# Patient Record
Sex: Female | Born: 1975 | Race: White | Hispanic: No | Marital: Married | Smoking: Never smoker
Health system: Southern US, Community
[De-identification: ages and names within clinical notes are randomized; demographics above are authoritative.]

## PROBLEM LIST (undated history)

## (undated) DIAGNOSIS — O24419 Gestational diabetes mellitus in pregnancy, unspecified control: Secondary | ICD-10-CM

## (undated) DIAGNOSIS — F329 Major depressive disorder, single episode, unspecified: Secondary | ICD-10-CM

## (undated) DIAGNOSIS — N9489 Other specified conditions associated with female genital organs and menstrual cycle: Secondary | ICD-10-CM

## (undated) DIAGNOSIS — K219 Gastro-esophageal reflux disease without esophagitis: Secondary | ICD-10-CM

## (undated) DIAGNOSIS — F32A Depression, unspecified: Secondary | ICD-10-CM

## (undated) DIAGNOSIS — G4700 Insomnia, unspecified: Secondary | ICD-10-CM

## (undated) DIAGNOSIS — R7303 Prediabetes: Secondary | ICD-10-CM

## (undated) DIAGNOSIS — F419 Anxiety disorder, unspecified: Secondary | ICD-10-CM

## (undated) DIAGNOSIS — E039 Hypothyroidism, unspecified: Secondary | ICD-10-CM

## (undated) DIAGNOSIS — E669 Obesity, unspecified: Secondary | ICD-10-CM

## (undated) DIAGNOSIS — N318 Other neuromuscular dysfunction of bladder: Secondary | ICD-10-CM

## (undated) HISTORY — DX: Gestational diabetes mellitus in pregnancy, unspecified control: O24.419

## (undated) HISTORY — DX: Other specified conditions associated with female genital organs and menstrual cycle: N94.89

## (undated) HISTORY — DX: Hypothyroidism, unspecified: E03.9

## (undated) HISTORY — DX: Anxiety disorder, unspecified: F41.9

## (undated) HISTORY — DX: Other neuromuscular dysfunction of bladder: N31.8

## (undated) HISTORY — DX: Depression, unspecified: F32.A

## (undated) HISTORY — DX: Prediabetes: R73.03

## (undated) HISTORY — DX: Insomnia, unspecified: G47.00

## (undated) HISTORY — PX: CHOLECYSTECTOMY: SHX55

## (undated) HISTORY — DX: Obesity, unspecified: E66.9

## (undated) HISTORY — DX: Gastro-esophageal reflux disease without esophagitis: K21.9

---
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
## 2002-03-20 ENCOUNTER — Encounter (INDEPENDENT_AMBULATORY_CARE_PROVIDER_SITE_OTHER): Payer: Self-pay | Admitting: Internal Medicine

## 2004-05-23 ENCOUNTER — Ambulatory Visit: Payer: Self-pay | Admitting: Family Medicine

## 2005-02-27 ENCOUNTER — Ambulatory Visit: Payer: Self-pay | Admitting: Family Medicine

## 2005-06-08 ENCOUNTER — Ambulatory Visit: Payer: Self-pay | Admitting: Family Medicine

## 2005-06-14 ENCOUNTER — Ambulatory Visit: Payer: Self-pay | Admitting: Family Medicine

## 2005-07-05 ENCOUNTER — Ambulatory Visit: Payer: Self-pay | Admitting: Family Medicine

## 2006-01-18 ENCOUNTER — Encounter (INDEPENDENT_AMBULATORY_CARE_PROVIDER_SITE_OTHER): Payer: Self-pay | Admitting: Internal Medicine

## 2006-01-18 LAB — CONVERTED CEMR LAB: Pap Smear: NORMAL

## 2006-02-05 ENCOUNTER — Ambulatory Visit: Payer: Self-pay | Admitting: Family Medicine

## 2006-02-14 ENCOUNTER — Ambulatory Visit: Payer: Self-pay | Admitting: Family Medicine

## 2006-02-19 ENCOUNTER — Other Ambulatory Visit: Admission: RE | Admit: 2006-02-19 | Discharge: 2006-02-19 | Payer: Self-pay | Admitting: Internal Medicine

## 2006-02-19 ENCOUNTER — Ambulatory Visit: Payer: Self-pay | Admitting: Internal Medicine

## 2006-04-23 ENCOUNTER — Ambulatory Visit: Payer: Self-pay | Admitting: Family Medicine

## 2006-05-18 ENCOUNTER — Ambulatory Visit: Payer: Self-pay | Admitting: Family Medicine

## 2006-05-22 ENCOUNTER — Ambulatory Visit: Payer: Self-pay | Admitting: Obstetrics & Gynecology

## 2006-05-23 ENCOUNTER — Ambulatory Visit (HOSPITAL_COMMUNITY): Admission: RE | Admit: 2006-05-23 | Discharge: 2006-05-23 | Payer: Self-pay | Admitting: Gynecology

## 2006-06-06 ENCOUNTER — Ambulatory Visit: Payer: Self-pay | Admitting: Obstetrics & Gynecology

## 2006-06-08 ENCOUNTER — Encounter: Payer: Self-pay | Admitting: Internal Medicine

## 2006-06-12 ENCOUNTER — Ambulatory Visit: Payer: Self-pay | Admitting: Family Medicine

## 2006-06-13 ENCOUNTER — Ambulatory Visit (HOSPITAL_COMMUNITY): Admission: RE | Admit: 2006-06-13 | Discharge: 2006-06-13 | Payer: Self-pay | Admitting: Obstetrics & Gynecology

## 2006-06-27 ENCOUNTER — Ambulatory Visit: Payer: Self-pay | Admitting: Obstetrics & Gynecology

## 2006-07-23 ENCOUNTER — Ambulatory Visit: Payer: Self-pay | Admitting: Gynecology

## 2006-07-25 ENCOUNTER — Ambulatory Visit: Payer: Self-pay | Admitting: Gynecology

## 2006-08-14 ENCOUNTER — Ambulatory Visit: Payer: Self-pay | Admitting: Gynecology

## 2006-09-03 ENCOUNTER — Ambulatory Visit: Payer: Self-pay | Admitting: Gynecology

## 2006-09-11 ENCOUNTER — Ambulatory Visit: Payer: Self-pay | Admitting: Family Medicine

## 2006-09-19 ENCOUNTER — Ambulatory Visit: Payer: Self-pay | Admitting: Obstetrics & Gynecology

## 2006-10-03 ENCOUNTER — Ambulatory Visit: Payer: Self-pay | Admitting: Obstetrics & Gynecology

## 2006-10-17 ENCOUNTER — Ambulatory Visit: Payer: Self-pay | Admitting: Obstetrics & Gynecology

## 2006-10-24 ENCOUNTER — Ambulatory Visit: Payer: Self-pay | Admitting: Obstetrics & Gynecology

## 2006-10-31 ENCOUNTER — Ambulatory Visit: Payer: Self-pay | Admitting: Obstetrics & Gynecology

## 2006-11-07 ENCOUNTER — Ambulatory Visit: Payer: Self-pay | Admitting: Obstetrics & Gynecology

## 2006-11-08 ENCOUNTER — Ambulatory Visit: Payer: Self-pay | Admitting: Obstetrics & Gynecology

## 2006-11-09 ENCOUNTER — Inpatient Hospital Stay (HOSPITAL_COMMUNITY): Admission: AD | Admit: 2006-11-09 | Discharge: 2006-11-09 | Payer: Self-pay | Admitting: Obstetrics and Gynecology

## 2006-11-09 ENCOUNTER — Ambulatory Visit: Payer: Self-pay | Admitting: *Deleted

## 2006-11-12 ENCOUNTER — Ambulatory Visit: Payer: Self-pay | Admitting: Gynecology

## 2006-11-16 ENCOUNTER — Ambulatory Visit: Payer: Self-pay | Admitting: Obstetrics & Gynecology

## 2006-11-16 ENCOUNTER — Inpatient Hospital Stay (HOSPITAL_COMMUNITY): Admission: AD | Admit: 2006-11-16 | Discharge: 2006-11-20 | Payer: Self-pay | Admitting: Gynecology

## 2006-12-04 ENCOUNTER — Telehealth: Payer: Self-pay | Admitting: Family Medicine

## 2006-12-08 ENCOUNTER — Inpatient Hospital Stay: Payer: Self-pay | Admitting: Surgery

## 2006-12-08 ENCOUNTER — Other Ambulatory Visit: Payer: Self-pay

## 2007-01-02 ENCOUNTER — Ambulatory Visit: Payer: Self-pay | Admitting: Obstetrics & Gynecology

## 2007-02-27 ENCOUNTER — Ambulatory Visit: Payer: Self-pay | Admitting: Obstetrics & Gynecology

## 2007-02-27 ENCOUNTER — Ambulatory Visit: Payer: Self-pay | Admitting: Family Medicine

## 2007-04-02 ENCOUNTER — Encounter: Payer: Self-pay | Admitting: Obstetrics & Gynecology

## 2007-04-02 ENCOUNTER — Ambulatory Visit: Payer: Self-pay | Admitting: Family Medicine

## 2007-06-05 ENCOUNTER — Ambulatory Visit: Payer: Self-pay | Admitting: Family Medicine

## 2007-06-05 DIAGNOSIS — F331 Major depressive disorder, recurrent, moderate: Secondary | ICD-10-CM | POA: Insufficient documentation

## 2007-06-05 DIAGNOSIS — F329 Major depressive disorder, single episode, unspecified: Secondary | ICD-10-CM

## 2007-06-25 ENCOUNTER — Ambulatory Visit: Payer: Self-pay | Admitting: Family Medicine

## 2007-07-25 ENCOUNTER — Encounter (INDEPENDENT_AMBULATORY_CARE_PROVIDER_SITE_OTHER): Payer: Self-pay | Admitting: Internal Medicine

## 2007-08-08 ENCOUNTER — Ambulatory Visit: Payer: Self-pay | Admitting: Family Medicine

## 2007-09-26 ENCOUNTER — Encounter (INDEPENDENT_AMBULATORY_CARE_PROVIDER_SITE_OTHER): Payer: Self-pay | Admitting: Internal Medicine

## 2007-10-16 ENCOUNTER — Ambulatory Visit: Payer: Self-pay | Admitting: Family Medicine

## 2007-10-16 DIAGNOSIS — K219 Gastro-esophageal reflux disease without esophagitis: Secondary | ICD-10-CM | POA: Insufficient documentation

## 2007-10-16 DIAGNOSIS — R1310 Dysphagia, unspecified: Secondary | ICD-10-CM | POA: Insufficient documentation

## 2007-12-18 ENCOUNTER — Ambulatory Visit: Payer: Self-pay | Admitting: Family Medicine

## 2008-03-09 ENCOUNTER — Ambulatory Visit: Payer: Self-pay | Admitting: Family Medicine

## 2008-03-09 LAB — CONVERTED CEMR LAB
Blood in Urine, dipstick: NEGATIVE
Protein, U semiquant: NEGATIVE

## 2008-03-10 ENCOUNTER — Encounter: Payer: Self-pay | Admitting: Family Medicine

## 2008-03-30 ENCOUNTER — Encounter (INDEPENDENT_AMBULATORY_CARE_PROVIDER_SITE_OTHER): Payer: Self-pay | Admitting: Internal Medicine

## 2008-04-14 ENCOUNTER — Telehealth: Payer: Self-pay | Admitting: Family Medicine

## 2008-07-02 ENCOUNTER — Encounter (INDEPENDENT_AMBULATORY_CARE_PROVIDER_SITE_OTHER): Payer: Self-pay | Admitting: Internal Medicine

## 2008-08-11 ENCOUNTER — Ambulatory Visit: Payer: Self-pay | Admitting: Family Medicine

## 2008-08-11 LAB — CONVERTED CEMR LAB: Rapid Strep: NEGATIVE

## 2008-10-14 ENCOUNTER — Telehealth (INDEPENDENT_AMBULATORY_CARE_PROVIDER_SITE_OTHER): Payer: Self-pay | Admitting: Internal Medicine

## 2008-10-16 ENCOUNTER — Telehealth (INDEPENDENT_AMBULATORY_CARE_PROVIDER_SITE_OTHER): Payer: Self-pay | Admitting: Internal Medicine

## 2008-10-18 HISTORY — PX: ECTOPIC PREGNANCY SURGERY: SHX613

## 2008-10-19 ENCOUNTER — Encounter (INDEPENDENT_AMBULATORY_CARE_PROVIDER_SITE_OTHER): Payer: Self-pay | Admitting: Internal Medicine

## 2008-10-21 ENCOUNTER — Ambulatory Visit: Payer: Self-pay | Admitting: Obstetrics and Gynecology

## 2008-10-24 ENCOUNTER — Encounter: Payer: Self-pay | Admitting: Obstetrics and Gynecology

## 2008-10-24 ENCOUNTER — Ambulatory Visit (HOSPITAL_COMMUNITY): Admission: AD | Admit: 2008-10-24 | Discharge: 2008-10-24 | Payer: Self-pay | Admitting: Obstetrics and Gynecology

## 2008-10-27 ENCOUNTER — Telehealth (INDEPENDENT_AMBULATORY_CARE_PROVIDER_SITE_OTHER): Payer: Self-pay | Admitting: Internal Medicine

## 2008-10-28 ENCOUNTER — Encounter (INDEPENDENT_AMBULATORY_CARE_PROVIDER_SITE_OTHER): Payer: Self-pay | Admitting: Internal Medicine

## 2008-10-29 ENCOUNTER — Ambulatory Visit: Payer: Self-pay | Admitting: Obstetrics and Gynecology

## 2008-10-29 ENCOUNTER — Encounter: Payer: Self-pay | Admitting: Family Medicine

## 2008-10-29 LAB — CONVERTED CEMR LAB: hCG, Beta Chain, Quant, S: 38.2 milliintl units/mL

## 2008-10-30 ENCOUNTER — Encounter: Payer: Self-pay | Admitting: Family Medicine

## 2008-11-10 ENCOUNTER — Ambulatory Visit: Payer: Self-pay | Admitting: Family Medicine

## 2008-11-24 ENCOUNTER — Encounter: Payer: Self-pay | Admitting: Obstetrics & Gynecology

## 2008-11-24 LAB — CONVERTED CEMR LAB: hCG, Beta Chain, Quant, S: 2.8 milliintl units/mL

## 2009-02-24 ENCOUNTER — Telehealth (INDEPENDENT_AMBULATORY_CARE_PROVIDER_SITE_OTHER): Payer: Self-pay | Admitting: *Deleted

## 2009-02-24 ENCOUNTER — Encounter (INDEPENDENT_AMBULATORY_CARE_PROVIDER_SITE_OTHER): Payer: Self-pay | Admitting: Internal Medicine

## 2009-03-04 ENCOUNTER — Ambulatory Visit: Payer: Self-pay | Admitting: Obstetrics & Gynecology

## 2009-03-08 ENCOUNTER — Ambulatory Visit: Payer: Self-pay | Admitting: Family Medicine

## 2009-03-17 ENCOUNTER — Ambulatory Visit: Payer: Self-pay | Admitting: Obstetrics & Gynecology

## 2009-03-23 ENCOUNTER — Ambulatory Visit (HOSPITAL_COMMUNITY): Admission: RE | Admit: 2009-03-23 | Discharge: 2009-03-23 | Payer: Self-pay | Admitting: Obstetrics & Gynecology

## 2009-03-25 ENCOUNTER — Ambulatory Visit: Payer: Self-pay | Admitting: Obstetrics and Gynecology

## 2009-04-26 ENCOUNTER — Ambulatory Visit: Payer: Self-pay | Admitting: Obstetrics and Gynecology

## 2009-04-27 ENCOUNTER — Telehealth: Payer: Self-pay | Admitting: Family Medicine

## 2009-05-04 ENCOUNTER — Ambulatory Visit (HOSPITAL_COMMUNITY): Admission: RE | Admit: 2009-05-04 | Discharge: 2009-05-04 | Payer: Self-pay | Admitting: Obstetrics and Gynecology

## 2009-05-09 ENCOUNTER — Emergency Department: Payer: Self-pay | Admitting: Emergency Medicine

## 2009-05-21 ENCOUNTER — Ambulatory Visit: Payer: Self-pay | Admitting: Family Medicine

## 2009-05-24 ENCOUNTER — Ambulatory Visit: Payer: Self-pay | Admitting: Obstetrics & Gynecology

## 2009-05-31 ENCOUNTER — Ambulatory Visit (HOSPITAL_COMMUNITY): Admission: RE | Admit: 2009-05-31 | Discharge: 2009-05-31 | Payer: Self-pay | Admitting: Obstetrics and Gynecology

## 2009-06-21 ENCOUNTER — Ambulatory Visit: Payer: Self-pay | Admitting: Obstetrics and Gynecology

## 2009-06-23 ENCOUNTER — Ambulatory Visit (HOSPITAL_COMMUNITY): Admission: RE | Admit: 2009-06-23 | Discharge: 2009-06-23 | Payer: Self-pay | Admitting: Obstetrics & Gynecology

## 2009-06-30 ENCOUNTER — Encounter: Payer: Self-pay | Admitting: Family Medicine

## 2009-07-19 ENCOUNTER — Ambulatory Visit: Payer: Self-pay | Admitting: Obstetrics and Gynecology

## 2009-08-09 ENCOUNTER — Ambulatory Visit: Payer: Self-pay | Admitting: Obstetrics and Gynecology

## 2009-08-19 ENCOUNTER — Ambulatory Visit (HOSPITAL_COMMUNITY): Admission: RE | Admit: 2009-08-19 | Discharge: 2009-08-19 | Payer: Self-pay | Admitting: Family Medicine

## 2009-08-19 ENCOUNTER — Ambulatory Visit: Payer: Self-pay | Admitting: Obstetrics and Gynecology

## 2009-08-20 ENCOUNTER — Encounter: Payer: Self-pay | Admitting: Obstetrics and Gynecology

## 2009-09-02 ENCOUNTER — Ambulatory Visit: Payer: Self-pay | Admitting: Obstetrics & Gynecology

## 2009-09-03 ENCOUNTER — Encounter: Payer: Self-pay | Admitting: Obstetrics and Gynecology

## 2009-09-16 ENCOUNTER — Ambulatory Visit: Payer: Self-pay | Admitting: Obstetrics & Gynecology

## 2009-09-27 ENCOUNTER — Ambulatory Visit: Payer: Self-pay | Admitting: Obstetrics and Gynecology

## 2009-10-12 ENCOUNTER — Ambulatory Visit: Payer: Self-pay | Admitting: Obstetrics & Gynecology

## 2009-10-20 ENCOUNTER — Ambulatory Visit: Payer: Self-pay | Admitting: Family Medicine

## 2009-10-21 ENCOUNTER — Encounter: Payer: Self-pay | Admitting: Family Medicine

## 2009-10-21 LAB — CONVERTED CEMR LAB: GC Probe Amp, Genital: NEGATIVE

## 2009-10-26 ENCOUNTER — Ambulatory Visit: Payer: Self-pay | Admitting: Family Medicine

## 2009-11-01 ENCOUNTER — Ambulatory Visit: Payer: Self-pay | Admitting: Obstetrics and Gynecology

## 2009-11-01 LAB — CONVERTED CEMR LAB

## 2009-11-09 ENCOUNTER — Ambulatory Visit: Payer: Self-pay | Admitting: Obstetrics & Gynecology

## 2009-11-14 ENCOUNTER — Ambulatory Visit: Payer: Self-pay | Admitting: Obstetrics & Gynecology

## 2009-11-14 ENCOUNTER — Inpatient Hospital Stay (HOSPITAL_COMMUNITY): Admission: AD | Admit: 2009-11-14 | Discharge: 2009-11-17 | Payer: Self-pay | Admitting: Obstetrics and Gynecology

## 2009-11-16 ENCOUNTER — Encounter: Payer: Self-pay | Admitting: Obstetrics and Gynecology

## 2009-12-23 ENCOUNTER — Ambulatory Visit: Payer: Self-pay | Admitting: Obstetrics & Gynecology

## 2009-12-23 ENCOUNTER — Encounter: Payer: Self-pay | Admitting: Obstetrics and Gynecology

## 2009-12-23 LAB — CONVERTED CEMR LAB: Yeast Wet Prep HPF POC: NONE SEEN

## 2009-12-28 ENCOUNTER — Ambulatory Visit: Payer: Self-pay | Admitting: Obstetrics & Gynecology

## 2010-01-13 ENCOUNTER — Ambulatory Visit: Payer: Self-pay | Admitting: Family Medicine

## 2010-01-19 ENCOUNTER — Encounter: Payer: Self-pay | Admitting: Internal Medicine

## 2010-02-14 ENCOUNTER — Ambulatory Visit: Payer: Self-pay | Admitting: Family Medicine

## 2010-04-05 ENCOUNTER — Ambulatory Visit
Admission: RE | Admit: 2010-04-05 | Discharge: 2010-04-05 | Payer: Self-pay | Source: Home / Self Care | Attending: Family Medicine | Admitting: Family Medicine

## 2010-04-05 ENCOUNTER — Encounter: Payer: Self-pay | Admitting: Family Medicine

## 2010-04-05 LAB — CONVERTED CEMR LAB
ALT: 21 units/L
ALT: 21 units/L (ref 0–35)
AST: 15 units/L
AST: 15 units/L (ref 0–37)
Alkaline Phosphatase: 72 units/L
Alkaline Phosphatase: 72 units/L (ref 39–117)
BUN: 13 mg/dL
BUN: 13 mg/dL (ref 6–23)
Calcium: 8.7 mg/dL
Calcium: 8.7 mg/dL (ref 8.4–10.5)
Chloride: 102 meq/L
Chloride: 102 meq/L (ref 96–112)
Creatinine, Ser: 0.73 mg/dL (ref 0.40–1.20)
Glucose, Bld: 91 mg/dL (ref 70–99)
HCT: 37 % (ref 36.0–46.0)
MCHC: 31.1 g/dL (ref 30.0–36.0)
MCV: 74.4 fL
MCV: 74.4 fL — ABNORMAL LOW (ref 78.0–100.0)
Potassium: 3.8 meq/L
RDW: 15.6 %
Sodium: 135 meq/L
TSH: 8.987 microintl units/mL
Total Bilirubin: 0.3 mg/dL (ref 0.3–1.2)
Total Protein: 7.5 g/dL
WBC: 12.8 10*3/uL — ABNORMAL HIGH (ref 4.0–10.5)

## 2010-04-07 ENCOUNTER — Encounter: Payer: Self-pay | Admitting: Family Medicine

## 2010-04-07 LAB — CONVERTED CEMR LAB
Free T4: 0.69 ng/dL
T3, Free: 2.7 pg/mL

## 2010-04-14 ENCOUNTER — Ambulatory Visit
Admission: RE | Admit: 2010-04-14 | Discharge: 2010-04-14 | Payer: Self-pay | Source: Home / Self Care | Attending: Family Medicine | Admitting: Family Medicine

## 2010-04-14 DIAGNOSIS — E039 Hypothyroidism, unspecified: Secondary | ICD-10-CM | POA: Insufficient documentation

## 2010-04-14 DIAGNOSIS — N39498 Other specified urinary incontinence: Secondary | ICD-10-CM | POA: Insufficient documentation

## 2010-04-15 ENCOUNTER — Encounter: Payer: Self-pay | Admitting: Family Medicine

## 2010-04-19 NOTE — Assessment & Plan Note (Signed)
Summary: flu shot/alc  Nurse Visit   Allergies: 1)  ! Pcn 2)  ! Erythromycin  Orders Added: 1)  Admin 1st Vaccine [90471] 2)  Flu Vaccine 35yrs + [[address]]  Flu Vaccine Consent Questions     Do you have a history of severe allergic reactions to this vaccine? no    Any prior history of allergic reactions to egg and/or gelatin? no    Do you have a sensitivity to the preservative Thimersol? no    Do you have a past history of Guillan-Barre Syndrome? no    Do you currently have an acute febrile illness? no    Have you ever had a severe reaction to latex? no    Vaccine information given and explained to patient? yes    Are you currently pregnant? no    Lot Number:[id number]   Exp Date:09/17/2010   Site Given  Left Deltoid IM

## 2010-04-19 NOTE — Letter (Signed)
Summary: [facility]   Imported By: Maryln Gottron 07/12/2009 15:55:50  _____________________________________________________________________  External Attachment:    Type:   Image     Comment:   External Document

## 2010-04-19 NOTE — Letter (Signed)
Summary: Out of Work  [employer] at [facility]  [address] [place], [place] [address]   Phone: [phone]  Fax: [phone]    February 14, 2010   Employee:  [employer]AL[employer]    To Whom It May Concern:   For Medical reasons, please excuse the above named employee from work for the following dates:  Start:   02/14/2010  End:   02/15/2010  If you need additional information, please feel free to contact our office.         Sincerely,    Ruthe Mannan MD

## 2010-04-19 NOTE — Letter (Signed)
Summary: [facility]   Imported By: Lanelle Bal 01/27/2010 08:05:54  _____________________________________________________________________  External Attachment:    Type:   Image     Comment:   External Document  Appended Document: [facility] restarting imipramine for stress incontinence considering bulking agents or even surgery (if not planning any other children)

## 2010-04-19 NOTE — Assessment & Plan Note (Signed)
Summary: UPPER RESPIRTORY COUGH, FEVER, CHILLS, ACHES / LFW   Vital Signs:  Patient profile:   35 year old female Height:      61.5 inches Weight:      224 pounds BMI:     41.79 Temp:     98.9 degrees F oral Pulse rate:   84 / minute Pulse rhythm:   regular BP sitting:   112 / 76  (right arm) Cuff size:   large  Vitals Entered By: Linde Gillis CMA Duncan Dull) (February 14, 2010 9:11 AM) CC: cough, congestion, fever   History of Present Illness: 35 yo here for URI symptoms.  4 days ago, started with runny nose, malaise. Progressed over next several days.  Tmas 101.2 Cough is dry. No wheezing or SOB.  Taking Mucinex with some relief of symptoms but having coughing spells at night.  Current Medications (verified): 1)  Tylenol Cold No Drowsiness 30-325-15 Mg Tabs (Pseudoephedrine-Apap-Dm) .... As Needed 2)  Omeprazole 20 Mg Cpdr (Omeprazole) .... One Tablet 45 Minutes Before Breakfast and One Tablet 45 Minutes Before Supper 3)  Delsym 30 Mg/30ml Lqcr (Dextromethorphan Polistirex) .... As Needed 4)  Imipramine Hcl 10 Mg Tabs (Imipramine Hcl) .... Take 2 By Mouth At Bedtime 5)  Tussionex Pennkinetic Er 8-10 Mg/38ml Lqcr (Chlorpheniramine-Hydrocodone) .... 5 Ml By Mouth At Bedtime As Needed Cough.  Allergies: 1)  ! Pcn 2)  ! Erythromycin  Past History:  Past Surgical History: Last updated: 02/27/2007 breast reconstruction  1996 chloecysltectomy  9/08  Social History: Last updated: 06/05/2007 Marital Status: Married Children: 1 son--3/09 Occupation: teaches school  Risk Factors: Alcohol Use: <1 (06/05/2007) Exercise: no (06/05/2007)  Risk Factors: Smoking Status: never (06/08/2006) Passive Smoke Exposure: no (06/05/2007)  Review of Systems      See HPI General:  Complains of fever and malaise. ENT:  Complains of earache, nasal congestion, postnasal drainage, sinus pressure, and sore throat. CV:  Denies chest pain or discomfort. Resp:  Complains of cough; denies  shortness of breath, sputum productive, and wheezing.  Physical Exam  General:  alert, well-developed, well-nourished, and well-hydrated, congested. VSS, non toxic appearing. Nose:  boggy turbinates, sinuses NTTP Mouth:  pharyngeal erythema.   Neck:  No deformities, masses, or tenderness noted. Lungs:  normal respiratory effort, no intercostal retractions, no accessory muscle use, and normal breath sounds.   Heart:  Normal rate and regular rhythm. S1 and S2 normal without gallop, murmur, click, rub or other extra sounds. Psych:  Cognition and judgment appear intact. Alert and cooperative with normal attention span and concentration. No apparent delusions, illusions, hallucinations   Impression & Recommendations:  Problem # 1:  URI (ICD-465.9) Assessment New Likely viral. Supportive care as per pt instructions. Tussionex as needed cough. The following medications were removed from the medication list:    Delsym 30 Mg/27ml Lqcr (Dextromethorphan polistirex) .Marland Kitchen... As needed Her updated medication list for this problem includes:    Tylenol Cold No Drowsiness 30-325-15 Mg Tabs (Pseudoephedrine-apap-dm) .Marland Kitchen... As needed    Tussionex Pennkinetic Er 8-10 Mg/51ml Lqcr (Chlorpheniramine-hydrocodone) .Marland KitchenMarland KitchenMarland KitchenMarland Kitchen 5 ml by mouth at bedtime as needed cough.  Complete Medication List: 1)  Tylenol Cold No Drowsiness 30-325-15 Mg Tabs (Pseudoephedrine-apap-dm) .... As needed 2)  Omeprazole 20 Mg Cpdr (Omeprazole) .... One tablet 45 minutes before breakfast and one tablet 45 minutes before supper 3)  Imipramine Hcl 10 Mg Tabs (Imipramine hcl) .... Take 2 by mouth at bedtime 4)  Tussionex Pennkinetic Er 8-10 Mg/89ml Lqcr (Chlorpheniramine-hydrocodone) .... 5 ml by mouth  at bedtime as needed cough.  Patient Instructions: 1)   Drink lots of fluids.  Treat sympotmatically with Mucinex, nasal saline irrigation, and Tylenol/Ibuprofen. Also try claritin D or zyrtec D over the counter- two times a day as needed ( have  to sign for them at pharmacy). You can use warm compresses.  Cough suppressant at night. Call if not improving as expected in 5-7 days.  Prescriptions: TUSSIONEX PENNKINETIC ER 8-10 MG/5ML LQCR (CHLORPHENIRAMINE-HYDROCODONE) 5 ml by mouth at bedtime as needed cough.  #5 ounces x 0   Entered and Authorized by:   Ruthe Mannan MD   Signed by:   Ruthe Mannan MD on 02/14/2010   Method used:   Print then Give to Patient   RxID:   [id number]    Orders Added: 1)  Est. Patient Level III [[address]]    Current Allergies (reviewed today): ! PCN ! ERYTHROMYCIN

## 2010-04-19 NOTE — Progress Notes (Signed)
Summary: concerned about flu  Phone Note Call from Patient Call back at [phone]   Caller: Patient Summary of Call: Pt has been exposed to the flu through her babysitter.  She didnt get a flu shot.  She is pregnant and is concerned about getting sick.  Please advise.  [facility]. Initial call taken by: Lowella Petties CMA,  April 27, 2009 4:22 PM  Follow-up for Phone Call        Resonably healthy people practicing good hygiene don't automatically get the flu from being "exposed". If develops fever, chills, congestion and extreme achiness, let us know immediately and will treat. As long as given medication within 48 hrs of sxs starting, should be effective. Follow-up by: Shaune Leeks MD,  April 27, 2009 5:01 PM  Additional Follow-up for Phone Call Additional follow up Details #1::        Advised pt.  She agreed. Additional Follow-up by: Lowella Petties CMA,  April 27, 2009 5:10 PM

## 2010-04-21 NOTE — Assessment & Plan Note (Signed)
Summary: TRANSFER FROM BILLIE/HYPOTHYROID/   Vital Signs:  Patient profile:   35 year old female Weight:      234 pounds Temp:     98.3 degrees F oral Pulse rate:   82 / minute Pulse rhythm:   regular BP sitting:   116 / 80  (left arm) Cuff size:   large  Vitals Entered By: Selena Batten Dance CMA Duncan Dull) (April 14, 2010 3:34 PM) CC: Thyroid issues Comments Headaches,Hot flashes, Weight gain,fatigue   History of Present Illness: CC: ? hypothyroidism, establish  Saw GYN for CPE last week.  Found to have IDA, as well low thyroid.  Started on iron, advised to f/u with PCP.  Sxs have been going on for 2 months, s/p new baby (33mo old at home).  + NS, hot flashes throughout day, no appetite, gaining weight.  + HA.  + fatigue  NO SOB, dizziness, palpitations.    Current Medications (verified): 1)  Omeprazole 20 Mg Cpdr (Omeprazole) .... One Tablet 45 Minutes Before Breakfast and One Tablet 45 Minutes Before Supper 2)  Imipramine Hcl 10 Mg Tabs (Imipramine Hcl) .... Take 2 By Mouth At Bedtime 3)  Iron 325 (65 Fe) Mg Tabs (Ferrous Sulfate) .Marland Kitchen.. 1 By Mouth Once Daily 4)  Cranberry Concentrate 500 Mg Caps (Cranberry) .Marland Kitchen.. 1 By Mouth Once Daily 5)  Prenatal/folic Acid  Tabs (Prenatal Vit-Fe Fumarate-Fa) .Marland Kitchen.. 1 By Mouth Once Daily 6)  Mucinex 600 Mg Xr12h-Tab (Guaifenesin) .Marland Kitchen.. 1 By Mouth Two Times A Day As Needed  Allergies: 1)  ! Erythromycin 2)  Pcn  Past History:  Past Medical History: ?GDM hypothyroid low bladder [uro]  Uro - Cope  Past Surgical History: breast reconstruction  1996 chloecyltectomy  9/08 ectopic L fallopian tube removed (tube remained) 10/2008  Family History: Parents: healthy Father: porcine valve replacement, HTN PGF: hemochromatosis MGM: ovarian cancer cousin: thyroid cancer  No DM, other CA, CAD/MI, CVA  Social History: no smoking, social EtOH, no rec drugs caffeine: 1 cup caffeine/day Marital Status: Married Lives with husband and 2 sons--2008,  2011 Occupation: teaches school, 2nd grade  Review of Systems       per HPI  Physical Exam  General:  alert, well-developed, well-nourished, and well-hydrated Head:  Normocephalic and atraumatic without obvious abnormalities. No apparent alopecia or balding. Eyes:  Conjunctiva clear bilaterally.  Ears:  TMs clear Nose:  nares clear Mouth:  MMMM, no erythema/exudate Neck:  No deformities, masses, or tenderness noted.  no thyromegaly, no LAD Lungs:  normal respiratory effort, no intercostal retractions, no accessory muscle use, and normal breath sounds.   Heart:  Normal rate and regular rhythm. S1 and S2 normal without gallop, murmur, click, rub or other extra sounds. Pulses:  2+ rad pulses Extremities:  no pedal edema   Impression & Recommendations:  Problem # 1:  HYPOTHYROIDISM (ICD-244.9) ? due to postpartum thyroiditis.  will start synthroid at daily, recheck in 6 wks.  slowly titrate up.  consider 6-12 mo treatment and then back down to see if longterm treatment will be needed.  Her updated medication list for this problem includes:    Synthroid 75 Mcg Tabs (Levothyroxine sodium) ..... One daily for thyroid (use this dose)  Labs Reviewed: TSH: 8.987 (04/05/2010)     Complete Medication List: 1)  Omeprazole 20 Mg Cpdr (Omeprazole) .... One tablet 45 minutes before breakfast and one tablet 45 minutes before supper 2)  Imipramine Hcl 10 Mg Tabs (Imipramine hcl) .... Take 2 by mouth at bedtime  3)  Iron 325 (65 Fe) Mg Tabs (Ferrous sulfate) .Marland Kitchen.. 1 by mouth once daily 4)  Cranberry Concentrate 500 Mg Caps (Cranberry) .Marland Kitchen.. 1 by mouth once daily 5)  Prenatal/folic Acid Tabs (Prenatal vit-fe fumarate-fa) .Marland Kitchen.. 1 by mouth once daily 6)  Mucinex 600 Mg Xr12h-tab (Guaifenesin) .Marland Kitchen.. 1 by mouth two times a day as needed 7)  Synthroid 75 Mcg Tabs (Levothyroxine sodium) .... One daily for thyroid (use this dose)  Patient Instructions: 1)  Start thyroid medicine for hypothyroid. 2)   Good to see you today. 3)  return in 6 weeks for repeat blood work and visit afterwards [TSH, free T4 244.9] 4)  Good to meet you today, call clinic with quesitons. Prescriptions: SYNTHROID 75 MCG TABS (LEVOTHYROXINE SODIUM) one daily for thyroid (use this dose)  #30 x 1   Entered and Authorized by:   Eustaquio Boyden  MD   Signed by:   Eustaquio Boyden  MD on 04/14/2010   Method used:   [facility]        [facility] (retail)       [address]       [place], [place]  [address]       Ph: [phone] or [phone]       Fax: [phone]   RxID:   [id number] SYNTHROID 100 MCG TABS (LEVOTHYROXINE SODIUM) take one daily for thyroid  #30 x 1   Entered and Authorized by:   Eustaquio Boyden  MD   Signed by:   Eustaquio Boyden  MD on 04/14/2010   Method used:   [facility]        [id number]       [address]       [place], [place]  [address]       Ph: [phone] or [phone]       Fax: [phone]   RxID:   [id number]    Orders Added: 1)  Est. Patient Level III [[address]]    Current Allergies (reviewed today): ! ERYTHROMYCIN PCN

## 2010-04-21 NOTE — Miscellaneous (Signed)
Summary: Previous labs  Clinical Lists Changes  Observations: Added new observation of T3 FREE: 2.7 pg/mL (04/07/2010 9:26) Added new observation of T4, FREE: 0.69 ng/dL (04/54/0981 1:91) Added new observation of FSH: 1.9 milliintl units/mL (04/05/2010 9:26) Added new observation of PLATELETK/UL: 268 K/uL (04/05/2010 9:26) Added new observation of RDW: 15.6 % (04/05/2010 9:26) Added new observation of MCV: 74.4 fL (04/05/2010 9:26) Added new observation of HCT: 37.0 % (04/05/2010 9:26) Added new observation of HGB: 11.5 g/dL (47/82/9562 1:30) Added new observation of WBC COUNT: 12.8 10*3/microliter (04/05/2010 9:26) Added new observation of TSH: 8.987 microintl units/mL (04/05/2010 9:26) Added new observation of CALCIUM: 8.7 mg/dL (86/57/8469 6:29) Added new observation of ALBUMIN: 4.5 g/dL (52/84/1324 4:01) Added new observation of PROTEIN, TOT: 7.5 g/dL (02/72/5366 4:40) Added new observation of SGPT (ALT): 21 units/L (04/05/2010 9:26) Added new observation of SGOT (AST): 15 units/L (04/05/2010 9:26) Added new observation of ALK PHOS: 72 units/L (04/05/2010 9:26) Added new observation of BILI TOTAL: 0.3 mg/dL (34/74/2595 6:38) Added new observation of CREATININE: 0.73 mg/dL (75/64/3329 5:18) Added new observation of BUN: 13 mg/dL (84/16/6063 0:16) Added new observation of BG RANDOM: 91 mg/dL (03/28/3233 5:73) Added new observation of CO2 PLSM/SER: 24 meq/L (04/05/2010 9:26) Added new observation of CL SERUM: 102 meq/L (04/05/2010 9:26) Added new observation of K SERUM: 3.8 meq/L (04/05/2010 9:26) Added new observation of NA: 135 meq/L (04/05/2010 9:26)

## 2010-04-28 ENCOUNTER — Telehealth: Payer: Self-pay | Admitting: Family Medicine

## 2010-04-29 ENCOUNTER — Encounter (INDEPENDENT_AMBULATORY_CARE_PROVIDER_SITE_OTHER): Payer: Self-pay | Admitting: *Deleted

## 2010-04-29 ENCOUNTER — Other Ambulatory Visit (INDEPENDENT_AMBULATORY_CARE_PROVIDER_SITE_OTHER): Payer: BC Managed Care – PPO

## 2010-04-29 DIAGNOSIS — E039 Hypothyroidism, unspecified: Secondary | ICD-10-CM

## 2010-05-03 ENCOUNTER — Encounter: Payer: Self-pay | Admitting: Family Medicine

## 2010-05-03 LAB — CONVERTED CEMR LAB
Free T4: 0.92 ng/dL
T3, Free: 2.8 pg/mL
TSH: 8.1 microintl units/mL

## 2010-05-04 ENCOUNTER — Encounter: Payer: Self-pay | Admitting: Family Medicine

## 2010-05-05 NOTE — Progress Notes (Signed)
Summary: still has sxs  Phone Note Call from Patient Call back at [phone]   Caller: Patient Call For: Eustaquio Boyden  MD Summary of Call: Pt has been on synthroid for 2 weeks and is still having hot flashes, headaches, still extremely tired even after a nights sleep, irritable. These are the same sxs she had prior to starting the medicine, but now they are worse.   She is asking if the medicine has not kicked in yet, or if she could be having a reaction to the medicine. Initial call taken by: Lowella Petties CMA, AAMA,  April 28, 2010 4:08 PM  Follow-up for Phone Call        if feeling worse, would recommend return for blood work sooner [TSH, free T3, free T4, anti TPO, and thyroid stimulating immunoglobulins 244.9].   Follow-up by: Eustaquio Boyden  MD,  April 28, 2010 9:17 PM  Additional Follow-up for Phone Call Additional follow up Details #1::        Notified patient and lab appt scheduled for this afternoon.  Additional Follow-up by: Janee Morn CMA Duncan Dull),  April 29, 2010 8:18 AM

## 2010-05-19 ENCOUNTER — Telehealth: Payer: Self-pay | Admitting: Family Medicine

## 2010-05-19 ENCOUNTER — Encounter: Payer: Self-pay | Admitting: Family Medicine

## 2010-05-19 ENCOUNTER — Encounter (INDEPENDENT_AMBULATORY_CARE_PROVIDER_SITE_OTHER): Payer: Self-pay | Admitting: *Deleted

## 2010-05-19 ENCOUNTER — Other Ambulatory Visit (INDEPENDENT_AMBULATORY_CARE_PROVIDER_SITE_OTHER): Payer: BC Managed Care – PPO

## 2010-05-19 DIAGNOSIS — R112 Nausea with vomiting, unspecified: Secondary | ICD-10-CM

## 2010-05-19 DIAGNOSIS — N912 Amenorrhea, unspecified: Secondary | ICD-10-CM

## 2010-05-19 DIAGNOSIS — E039 Hypothyroidism, unspecified: Secondary | ICD-10-CM

## 2010-05-19 LAB — CONVERTED CEMR LAB
AST: 13 units/L
Albumin: 4 g/dL
BUN: 12 mg/dL
Calcium: 8.5 mg/dL
Chloride: 100 meq/L
Glucose, Bld: 95 mg/dL
Hemoglobin: 12.1 g/dL
MCV: 73 fL
Platelets: 265 10*3/uL
Potassium: 3.7 meq/L
TSH: 1.84 microintl units/mL

## 2010-05-26 NOTE — Progress Notes (Signed)
Summary: pt wants labs  Phone Note Call from Patient Call back at [phone]   Caller: Patient Call For: Eustaquio Boyden  MD Summary of Call: Pt states she has been getting sick every morning- vomiting.  Didnt have a period in february but has taken 2 pregnancy tests which were negative.  She is wondering if this could be caused by synthroid.  Asking if she can come in and get her thyroid levels checked and get a serum pregnancy test.  She said she was told to get her thyroid levels checked every 2 weeks, since she has not been feeling well. Initial call taken by: Lowella Petties CMA, AAMA,  May 19, 2010 9:37 AM  Follow-up for Phone Call        ok to come in for blood work [serum HCG and TSH, CMP, lipase, CBC, free T4/T3 244.9, 626.0, 787.01]. Follow-up by: Eustaquio Boyden  MD,  May 19, 2010 11:34 AM  Additional Follow-up for Phone Call Additional follow up Details #1::        Patient notified and lab appt scheduled.  Additional Follow-up by: Janee Morn CMA Duncan Dull),  May 19, 2010 11:46 AM  New Problems: NAUSEA WITH VOMITING (ICD-787.01) AMENORRHEA (ICD-626.0)   New Problems: NAUSEA WITH VOMITING (ICD-787.01) AMENORRHEA (ICD-626.0)

## 2010-05-27 ENCOUNTER — Encounter: Payer: Self-pay | Admitting: Family Medicine

## 2010-05-27 ENCOUNTER — Ambulatory Visit: Payer: BC Managed Care – PPO | Admitting: Family Medicine

## 2010-05-27 DIAGNOSIS — J029 Acute pharyngitis, unspecified: Secondary | ICD-10-CM

## 2010-05-27 DIAGNOSIS — R059 Cough, unspecified: Secondary | ICD-10-CM

## 2010-05-27 DIAGNOSIS — J309 Allergic rhinitis, unspecified: Secondary | ICD-10-CM | POA: Insufficient documentation

## 2010-05-27 DIAGNOSIS — J019 Acute sinusitis, unspecified: Secondary | ICD-10-CM

## 2010-05-27 DIAGNOSIS — R05 Cough: Secondary | ICD-10-CM

## 2010-05-27 DIAGNOSIS — E039 Hypothyroidism, unspecified: Secondary | ICD-10-CM

## 2010-05-31 NOTE — Assessment & Plan Note (Signed)
Summary: SORE THROAT SWOLLEN GLANDS   Vital Signs:  Patient Profile:   35 Years Old Female CC:      Cough Height:     61.5 inches O2 Sat:      99 % O2 treatment:    Room Air Temp:     98.5 degrees F oral Pulse rate:   84 / minute Pulse rhythm:   regular Resp:     14 per minute BP sitting:   135 / 89  (left arm) Cuff size:   large  Vitals Entered By: Standley Dakins MD (May 27, 2010 9:23 PM)                  Current Allergies (reviewed today): ! ERYTHROMYCIN PCNHistory of Present Illness History from: patient Reason for visit: see chief complaint Chief Complaint: Cough History of Present Illness: The patient is reporting 1 week of cough, sinus pain and pressure in maxillary and frontal areas.  She is reporting occasional tightness in chest, chest congestion and hoarseness;  She is having sore throat and ear discharge. She came to be evaluated because of the persistent cough and sore throat.      REVIEW OF SYSTEMS Constitutional Symptoms       Complains of fever.     Denies chills, night sweats, weight loss, weight gain, and fatigue.  Eyes       Denies change in vision, eye pain, eye discharge, glasses, contact lenses, and eye surgery. Ear/Nose/Throat/Mouth       Complains of ear discharge, sinus problems, sore throat, and hoarseness.      Denies hearing loss/aids, change in hearing, ear pain, dizziness, frequent runny nose, frequent nose bleeds, and tooth pain or bleeding.  Respiratory       Complains of dry cough and productive cough.      Denies wheezing, shortness of breath, asthma, bronchitis, and emphysema/COPD.  Cardiovascular       Denies murmurs, chest pain, and tires easily with exhertion.    Gastrointestinal       Denies stomach pain, nausea/vomiting, diarrhea, constipation, blood in bowel movements, and indigestion. Genitourniary       Denies painful urination, kidney stones, and loss of urinary control. Neurological       Denies paralysis, seizures, and  fainting/blackouts. Musculoskeletal       Denies muscle pain, joint pain, joint stiffness, decreased range of motion, redness, swelling, muscle weakness, and gout.  Skin       Denies bruising, unusual mles/lumps or sores, and hair/skin or nail changes.  Psych       Denies mood changes, temper/anger issues, anxiety/stress, speech problems, depression, and sleep problems.  Past History:  Family History: Last updated: 04/14/2010 Parents: healthy Father: porcine valve replacement, HTN PGF: hemochromatosis MGM: ovarian cancer cousin: thyroid cancer  No DM, other CA, CAD/MI, CVA  Social History: Last updated: 04/14/2010 no smoking, social EtOH, no rec drugs caffeine: 1 cup caffeine/day Marital Status: Married Lives with husband and 2 sons--2008, 2011 Occupation: teaches school, 2nd grade  Risk Factors: Alcohol Use: <1 (06/05/2007) Exercise: no (06/05/2007)  Risk Factors: Smoking Status: never (06/08/2006) Passive Smoke Exposure: no (06/05/2007)  Past Medical History: ?GDM hypothyroidism - medically treated low bladder [uro] Insomnia GERD Seasonal Allergic Rhintis  Qualified Personal Urologist - [place]  Past Surgical History: left breast reconstruction  April 1996 Cholecystectomy   9/08 ectopic L fallopian tube removed (tube remained) 10/2008  Family History: Reviewed history from 04/14/2010 and no changes required. Parents: healthy  Father: porcine valve replacement, HTN PGF: hemochromatosis MGM: ovarian cancer cousin: thyroid cancer  No DM, other CA, CAD/MI, CVA  Social History: Reviewed history from 04/14/2010 and no changes required. no smoking, social EtOH, no rec drugs caffeine: 1 cup caffeine/day Marital Status: Married Lives with husband and 2 sons--2008, 2011 Occupation: teaches school, 2nd grade Physical Exam General appearance: well developed, well nourished, no acute distress Head: normocephalic, atraumatic Eyes: conjunctivae and lids  normal Pupils: equal, round, reactive to light Ears: normal, no lesions or deformities Nasal: swollen red turbinates with congestion Oral/Pharynx: dry tongue, pharyngeal erythema with white patchy exudate, uvula midline without deviation Neck: neck supple,  trachea midline, no masses, mild LAD Thyroid: no nodules, masses, tenderness, or enlargement Chest/Lungs: rare faint crackles heard posteriorly, no rales, wheezes, or rhonchi bilateral, breath sounds equal without effort Heart: regular rate and  rhythm, no murmur Extremities: normal extremities Neurological: grossly intact and non-focal Skin: no obvious rashes or lesions MSE: oriented to time, place, and person Assessment New Problems: COUGH (ICD-786.2) ALLERGIC RHINITIS CAUSE UNSPECIFIED (ICD-477.9) ACUTE SINUSITIS, UNSPECIFIED (ICD-461.9)   Patient Education: Patient and/or caregiver instructed in the following: rest, fluids. The risks, benefits and possible side effects were clearly explained and discussed with the patient.  The patient verbalized clear understanding.  The patient was given instructions to return if symptoms don't improve, worsen or new changes develop.  If it is not during clinic hours and the patient cannot get back to this clinic then the patient was told to seek medical care at an available urgent care or emergency department.  The patient verbalized understanding.   Demonstrates willingness to comply.  Plan New Medications/Changes: DOXYCYCLINE HYCLATE 100 MG TABS (DOXYCYCLINE HYCLATE) take 1 by mouth two times a day with food: Avoid Heavy Sun Exposure  #20 x 0, 05/27/2010, Clanford Johnson MD FLUTICASONE PROPIONATE 50 MCG/ACT SUSP (FLUTICASONE PROPIONATE) 2 sprays per nostril once daily  #1 x 1, 05/27/2010, Clanford Johnson MD  Follow Up: Follow up in 2-3 days if no improvement, Follow up on an as needed basis, Follow up with Primary Physician  The patient and/or caregiver has been counseled thoroughly with  regard to medications prescribed including dosage, schedule, interactions, rationale for use, and possible side effects and they verbalize understanding.  Diagnoses and expected course of recovery discussed and will return if not improved as expected or if the condition worsens. Patient and/or caregiver verbalized understanding.  Prescriptions: DOXYCYCLINE HYCLATE 100 MG TABS (DOXYCYCLINE HYCLATE) take 1 by mouth two times a day with food: Avoid Heavy Sun Exposure  #20 x 0   Entered and Authorized by:   Standley Dakins MD   Signed by:   Standley Dakins MD on 05/27/2010   Method used:   [facility]ically to        [facility] [id number]retail[facility]       [address]       [place], [place]  [address]       Ph: [phone] or [phone]       Fax: [phone]   RxID:   [id number] FLUTICASONE PROPIONATE 50 MCG/ACT SUSP (FLUTICASONE PROPIONATE) 2 sprays per nostril once daily  #1 x 1   Entered and Authorized by:   Standley Dakins MD   Signed by:   Standley Dakins MD on 05/27/2010   Method used:   [facility]        [facility] [phone][facility] (retail)       [address]  [place], [place]  [address]       Ph: [phone] or [phone]       Fax: [phone]   RxID:   [id number]   Patient Instructions: 1)  Go to the pharmacy and pick up your prescription (s).  It may take up to 30 mins for electronic prescriptions to be delivered to the pharmacy.  Please call if your pharmacy has not received your prescriptions after 30 minutes.   2)  The risks, benefits and possible side effects were clearly explained and discussed with the patient.  The patient verbalized clear understanding.  The patient was given instructions to return if symptoms don't improve, worsen or new changes develop.  If it is not during clinic hours and the patient cannot get back to this clinic then the patient was told to seek medical care at an available urgent care or emergency department.  The  patient verbalized understanding.   3)  Return or go to the ER if no improvement or symptoms getting worse.   4)  The patient was informed that there is no on-call provider or services available at this clinic during off-hours (when the clinic is closed).  If the patient developed a problem or concern that required immediate attention, the patient was advised to go the the nearest available urgent care or emergency department for medical care.  The patient verbalized understanding.    5)  Oral Rehydration Solution: drink 1/2 ounce every 15 minutes. If tolerated afert 1 hour, drink 1 ounce every 15 minutes. As you can tolerate, keep adding 1/2 ounce every 15 minutes, up to a total of 2-4 ounces. Contact the office if unable to tolerate oral solution, if you keep vomiting, or you continue to have signs of dehydration. 6)  Take your antibiotic as prescribed until ALL of it is gone, but stop if you develop a rash or swelling and contact our office as soon as possible. 7)  Acute sinusitis symptoms for less than 10 days are not helped by antibiotics.Use warm moist compresses, and over the counter decongestants ( only as directed). Call if no improvement in 5-7 days, sooner if increasing pain, fever, or new symptoms.     I called and checked with patient to be sure she wasn't pregnant and she assured me that she was not pregnant.  She had just been recently tested and said she was absolutely sure she was not.  Rodney Langton, MD, CDE, FAAFP   The patient was given instructions to Return or go to the ER if no improvement or symptoms getting worse.  The patient verbalized clear understanding.

## 2010-06-03 LAB — CBC
HCT: 32.1 % — ABNORMAL LOW (ref 36.0–46.0)
MCV: 75.1 fL — ABNORMAL LOW (ref 78.0–100.0)
Platelets: 182 10*3/uL (ref 150–400)
Platelets: 221 10*3/uL (ref 150–400)
RBC: 3.51 MIL/uL — ABNORMAL LOW (ref 3.87–5.11)
RDW: 17.8 % — ABNORMAL HIGH (ref 11.5–15.5)
WBC: 14 10*3/uL — ABNORMAL HIGH (ref 4.0–10.5)
WBC: 16.3 10*3/uL — ABNORMAL HIGH (ref 4.0–10.5)

## 2010-06-03 LAB — RPR: RPR Ser Ql: NONREACTIVE

## 2010-06-14 ENCOUNTER — Ambulatory Visit: Payer: BC Managed Care – PPO | Admitting: Obstetrics and Gynecology

## 2010-06-14 DIAGNOSIS — L293 Anogenital pruritus, unspecified: Secondary | ICD-10-CM

## 2010-06-15 ENCOUNTER — Encounter: Payer: Self-pay | Admitting: Obstetrics & Gynecology

## 2010-06-15 LAB — CONVERTED CEMR LAB
Clue Cells Wet Prep HPF POC: NONE SEEN
Trich, Wet Prep: NONE SEEN

## 2010-06-16 NOTE — Letter (Signed)
Summary: history form   history form   Imported By: Eugenio Hoes 06/07/2010 15:30:07  _____________________________________________________________________  External Attachment:    Type:   Image     Comment:   External Document

## 2010-06-19 HISTORY — PX: OTHER SURGICAL HISTORY: SHX169

## 2010-06-25 LAB — CBC
MCHC: 33.4 g/dL (ref 30.0–36.0)
Platelets: 276 10*3/uL (ref 150–400)
RDW: 15.1 % (ref 11.5–15.5)

## 2010-06-25 LAB — URINALYSIS, ROUTINE W REFLEX MICROSCOPIC
Glucose, UA: NEGATIVE mg/dL
Ketones, ur: 15 mg/dL — AB
Protein, ur: NEGATIVE mg/dL

## 2010-06-25 LAB — URINE MICROSCOPIC-ADD ON

## 2010-06-25 LAB — ABO/RH: ABO/RH(D): O POS

## 2010-06-25 LAB — GC/CHLAMYDIA PROBE AMP, GENITAL
Chlamydia, DNA Probe: NEGATIVE
GC Probe Amp, Genital: NEGATIVE

## 2010-06-25 LAB — WET PREP, GENITAL
Clue Cells Wet Prep HPF POC: NONE SEEN
Trich, Wet Prep: NONE SEEN

## 2010-06-25 LAB — POCT PREGNANCY, URINE: Preg Test, Ur: POSITIVE

## 2010-07-10 ENCOUNTER — Other Ambulatory Visit: Payer: Self-pay | Admitting: Family Medicine

## 2010-07-10 NOTE — Telephone Encounter (Signed)
Sent in.  Notify pt.

## 2010-07-11 ENCOUNTER — Encounter: Payer: Self-pay | Admitting: Family Medicine

## 2010-07-12 ENCOUNTER — Encounter: Payer: Self-pay | Admitting: Family Medicine

## 2010-07-12 ENCOUNTER — Ambulatory Visit (INDEPENDENT_AMBULATORY_CARE_PROVIDER_SITE_OTHER): Payer: BC Managed Care – PPO | Admitting: Family Medicine

## 2010-07-12 DIAGNOSIS — R5383 Other fatigue: Secondary | ICD-10-CM | POA: Insufficient documentation

## 2010-07-12 DIAGNOSIS — E039 Hypothyroidism, unspecified: Secondary | ICD-10-CM

## 2010-07-12 NOTE — Progress Notes (Signed)
  Subjective:    Patient ID: Tamara Shaffer, female    DOB: 1975/11/05, 35 y.o.   MRN: [id number]  HPI CC: f/u thyroid  Last 2 wks not feeling well, told by husband that moods have been snappy, more hot flashes, every other hour.  Feels they come on when stressed.  Feeling more tired.  Weight - loses 5-7 lbs then gains again.  Has increased water, cut out all soft drinks and sweet tea.  Walks 1/2 -1 mi/day.  NO skin changes, hair changes, constipation, diarrhea.  Stays warm.  Doesn't get cold, does get warm easily (hot flashes).  No recent viral illnesses.  States sleeps tossing and turning, wakes up at times from hot flashes. Only sleeping about 6 hours/night, on weekends more.  Denies depression/anxiety but husband does notice pt more moody.  Wt Readings from Last 3 Encounters:  07/12/10 235 lb 0.6 oz (106.613 kg)  04/14/10 234 lb (106.142 kg)  02/14/10 224 lb (101.606 kg)   Medications and allergies reviewed and updated as above. PMhx reviewed for relevance.  Review of Systems Per HPI    Objective:   Physical Exam  Vitals reviewed. Constitutional: She appears well-developed and well-nourished. No distress.  HENT:  Head: Normocephalic and atraumatic.  Mouth/Throat: Oropharynx is clear and moist. No oropharyngeal exudate.  Eyes: Conjunctivae are normal. Pupils are equal, round, and reactive to light. No scleral icterus.  Neck: Normal range of motion. Neck supple. Thyromegaly present.  Cardiovascular: Normal rate, regular rhythm, normal heart sounds and intact distal pulses.   No murmur heard. Pulmonary/Chest: Effort normal and breath sounds normal. No respiratory distress. She has no wheezes. She has no rales.  Musculoskeletal: She exhibits no edema.  Lymphadenopathy:    She has no cervical adenopathy.  Skin: Skin is warm and dry. No rash noted.  Psychiatric: She has a normal mood and affect.          Assessment & Plan:

## 2010-07-12 NOTE — Assessment & Plan Note (Signed)
Check blood work. Seems to be watching diet, exercising. Discussed importance of sleep as well as staying well hydrated (which it sounds like she does).

## 2010-07-12 NOTE — Patient Instructions (Signed)
Blood work today to check thyroid as well as some vitamin levels to ensure not deficient. We will call you with results. Good to see you today! Remind lab to send to [facility].

## 2010-07-12 NOTE — Assessment & Plan Note (Signed)
Recheck levels, titrate accordingly

## 2010-07-14 LAB — TSH: TSH: 2.07 u[IU]/mL (ref 0.41–5.90)

## 2010-07-14 LAB — CBC AND DIFFERENTIAL
Hemoglobin: 11.7 g/dL — AB (ref 12.0–16.0)
platelet count: 285

## 2010-07-14 LAB — VITAMIN B12: Vitamin B-12: 478

## 2010-07-18 ENCOUNTER — Telehealth: Payer: Self-pay | Admitting: Family Medicine

## 2010-07-18 ENCOUNTER — Encounter: Payer: Self-pay | Admitting: Family Medicine

## 2010-07-18 DIAGNOSIS — E010 Iodine-deficiency related diffuse (endemic) goiter: Secondary | ICD-10-CM

## 2010-07-18 DIAGNOSIS — D72829 Elevated white blood cell count, unspecified: Secondary | ICD-10-CM

## 2010-07-18 NOTE — Telephone Encounter (Signed)
See scanned document.  Will set up patient with thyroid ultrasound in setting of persistently elevated white count and TM.

## 2010-07-22 ENCOUNTER — Ambulatory Visit
Admission: RE | Admit: 2010-07-22 | Discharge: 2010-07-22 | Disposition: A | Discharge status: Home / Self Care | Payer: BC Managed Care – PPO | Source: Ambulatory Visit | Attending: Family Medicine | Admitting: Family Medicine

## 2010-07-22 DIAGNOSIS — E010 Iodine-deficiency related diffuse (endemic) goiter: Secondary | ICD-10-CM

## 2010-07-22 DIAGNOSIS — D72829 Elevated white blood cell count, unspecified: Secondary | ICD-10-CM

## 2010-08-02 NOTE — Assessment & Plan Note (Signed)
NAMEJENNY, Shaffer NO.:  [id number]   MEDICAL RECORD NO.:  [id number]          PATIENT TYPE:  POB   LOCATION:  [facility]         FACILITY:  [facility]   PHYSICIAN:  Tinnie Gens, MD        DATE OF BIRTH:  [birth year]   DATE OF SERVICE:  04/02/2007                                  CLINIC NOTE   Tamara Shaffer is a 35 year old white married female gravida 1, para 1 who  presents for her annual exam.  She states that she is doing quite well  after her delivery in August 2008.  She also reports that she was seen  in December by Tamara Shaffer for evaluation of urinary complaints.  At that  time, it was noticed that she had bladder prolapse and she was referred  to Tamara Shaffer for evaluation.  She is in the process of scheduling  urodynamic testing with him.  He has discussed possible treatments with  her, but wants this testing done first.  Today, she requests that her  Wellbutrin be increased from 150 mg to 300 mg, which is the dose that  she was on prior to her pregnancy.  She feels much better on that dose  and since she has is no longer nursing would like [place] to consider that.  She recently got a prescription from Tamara Shaffer at [facility] but we gave  her a prescription at her postpartum exam.   PAST MEDICAL HISTORY:  No changes.   FAMILY HISTORY:  No changes.   GYN HISTORY:  She is now gravida 1, para 1 with delivery of a viable  female infant on November 18, 2006. She had a spontaneous vaginal delivery  with no episiotomy.  She nursed for 3 weeks and then began having  abdominal pain which resulted in a cholecystectomy.  She has not nursed  since then.  She is using condoms for contraception and is not extremely  sexually active due to the fact that she has uncontrollable urination  and this is very uncomfortable for her.   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  She denies  tobacco and alcohol use.   REVIEW OF SYSTEMS:  1. Bladder issues  continue.  2. Depression which she feels is partially treated with Wellbutrin 150      mg, but would like to increase her dose today.   CURRENT MEDICATIONS:  Wellbutrin 150 mg daily.   Last Pap smear December 2007.  First day of her last period was March 25, 2007.   PHYSICAL EXAM:  VITAL SIGNS:  Pulse 89, blood pressure 123/92, weight  213.   ALLERGIES:  No known allergies.   HEAD, EARS, NOSE AND THROAT:  Without thyroid enlargement.  BREASTS:  Bilaterally soft without masses, nodes or nipple discharge.  Heart rate and rhythm are regular without murmur, gallop or cardiac  enlargement.  LUNGS:  Bilaterally clear.  ABDOMEN: is soft without hepatosplenomegaly or tenderness.  Incisions  are healing quite well after her cholecystectomy.  PELVIC EXAM:  External genitalia within normal limits for female.  Vagina is clean and rugose.  The bladder protrudes into the introitus  with  straining.  There is a small amount of leakage in the supine  position.  The cervix is parous and clean.  Uterus normal shape, size  and contour without tenderness.  Adnexa bilaterally clear.  Rectovaginal  exam deferred.  EXTREMITIES:  Within normal limits.   ASSESSMENT:  1. Normal pelvic exam.  2. Bladder prolapse, cystocele with urinary leaking.  3. Depression, partially controlled.   PLAN:  1. Pap smear was taken today.  The patient is okay with condoms for      now.  She may consider another pregnancy soon because she is most      likely interested in bladder repair, but would like to wait after      her all desired children.  2.  We will increase her Wellbutrin to      300 mg daily for today a prescription was written for 30 tablets      with one refill and then she can follow up with Tamara Shaffer, the      nurse practitioner, Tamara Shaffer who originally gave her this      medication.  2. Encouraged Tamara Shaffer to continue her evaluation with Tamara Shaffer.  3. We will report her Pap smear findings  to her when they are back.      She is to return to the office in 1 year for annual exam or sooner      if she becomes pregnant.     ______________________________  Matt Holmes, N.P.    ______________________________  Tinnie Gens, MD    EMK/MEDQ  D:  04/02/2007  T:  04/02/2007  Job:  [id number]

## 2010-08-02 NOTE — Assessment & Plan Note (Signed)
Tamara Shaffer, BOZARTH              ACCOUNT NO.:  [id number]   MEDICAL RECORD NO.:  [id number]          PATIENT TYPE:  POB   LOCATION:  [facility]         FACILITY[facility]  [facility]   PHYSICIAN:  Tinnie Gens, MD        DATE OF BIRTH:  02/19/1976   DATE OF SERVICE:  11/10/2008                                  CLINIC NOTE   CHIEF COMPLAINT:  Followed by ectopic pregnancy.   HISTORY OF PRESENT ILLNESS:  The patient is a 35 year old gravida 2,  para 1-0-1-1 who underwent laparoscopic salpingostomy of the left tube  for a left ectopic.  She is still sad about pregnancy loss.  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  She has not resumed sex as of yet.  She seems to be  healing well.  She is without significant complaint today.  Her last  beta was 38.  She is awake post surgery.   On exam today her vital signs are noted in the chart.  She is a well-  developed, well-nourished female in no acute distress.  Abdomen:  Soft,  nontender, nondistended.  Incision lines are well healed.  Normal  pelvic, normal sized uterus.  No adnexal mass or tenderness.   IMPRESSION:  Status post left salpingostomy for left ectopic pregnancy.   PLAN:  Followup beta-HCG and may get pregnant in 2-3 months.           ______________________________  Tinnie Gens, MD     TP/MEDQ  D:  11/10/2008  T:  11/11/2008  Job:  [id number]

## 2010-08-02 NOTE — Assessment & Plan Note (Signed)
NAME:  Tamara Shaffer, Tamara Shaffer NO.:  [id number]   MEDICAL RECORD NO.:  [id number]          PATIENT TYPE:  POB   LOCATION:  [facility]         FACILITY:  [facility]   PHYSICIAN:  Tinnie Gens, MD        DATE OF BIRTH:  [birth year]   DATE OF SERVICE:                                  CLINIC NOTE   CHIEF COMPLAINT:  Physical exam.   HISTORY OF PRESENT ILLNESS:  The patient is a 35 year old, gravida 3,  para 2-0-1-2 who is here today for a physical exam.  She denies  significant issues related to cycle.  She was nursing up until December  at which time she thought that she had a period.  She thinks she will  have a period again next month.  She stopped breast feeding because she  want to get back on imipramine for her bladder symptoms and urinary  incontinence.  She continues to have problems with recurrent bladder  infection and urinary incontinence.  She is in fact changing 3-4 pads  daily.  She has seen urologist for this problem who has recommended  collagen injection in the bladder neck versus definitive bladder  tacking.  Dr. Marice Potter had previously counseled to the patient about not  attempting that until she is planning on having more children which she  would definitely like to have more kids.  The patient is using condoms  for birth control.   PAST MEDICAL HISTORY:  Significant for obesity, GERD and urinary  incontinence.   PAST SURGICAL HISTORY:  She had a left linear salpingostomy for an  ectopic pregnancy, cholecystectomy and a breast reduction on her left.   MEDICATIONS:  1. She is presently on imipramine 25 mg one p.o. daily.  2. Cipro 500 mg one p.o. b.i.d. for 3 days.  3. Omeprazole 20 mg p.o. b.i.d.   ALLERGIES:  To PENICILLIN.  No latex allergy.   OBSTETRICAL HISTORY:  She is a G3, P2 with one ectopic pregnancy and two  vaginal deliveries, the greatest of which was 8 pounds 4 ounces.   GYNECOLOGIC HISTORY:  No history of abnormal Pap with his history  of  urinary incontinence and ectopic pregnancy.   FAMILY HISTORY:  Maternal grandmother had ovarian cancer.  Her sister  has a history of cervical dysplasia, no breast, cervical, uterine or  prostate cancer.   SOCIAL HISTORY:  No tobacco, alcohol or drug use.  She works as a  Runner, broadcasting/film/video.   REVIEW OF SYSTEMS:  Reviewed.  The patient reports drenching hot  flashes, but continue especially at night and has been present since  pregnancy.  She denies fevers, chills, nausea, vomiting, diarrhea,  constipation, headache, vision changes, hearing loss, chest pain,  shortness of breath, swelling of feet or ankles, breast masses.   PHYSICAL EXAMINATION:  VITAL SIGNS:  Today, vitals are as noted in the  chart.  Her weight is 233 pounds which is 9 pounds down from when she  delivered and 10 pounds greater than she was immediately postpartum.  HEENT:  Normocephalic, atraumatic.  Sclerae anicteric.  NECK:  Supple.  Normal thyroid.  LUNGS:  Clear bilaterally.  CV:  Regular rate  and rhythm.  No rubs, gallops or murmurs.  ABDOMEN:  Soft, nontender, nondistended.  EXTREMITIES:  No cyanosis, clubbing or edema.  Distal pulses 2+.  BREASTS:  Symmetric with everted nipples.  No masses.  No  supraclavicular or axillary adenopathy.  Breast reduction scars are  noted around the left breast.  GU:  Normal external female genitalia.  BUS is normal.  Bladder mild  degree of cystocele, but nothing very apparent.  No real rectocele  noted.  There may be a small inner a feel.  The uterus has good support  and is up.  The patient does not lose any urine with Valsalva in the  office today.  Uterus is small.  Uterine and adnexal exam was somewhat  limited by body habitus, but this is definitely not nontender.   IMPRESSION:  1. Gynecologic exam with Pap.  2. Hot flashes, unclear etiology.  3. Obesity.  4. Chronic urinary incontinence of unclear etiology, although she has      had full urodynamic testing with  Urology.   PLAN:  1. Pap smear today.  2. The patient may achieve pregnancy at her leisure.  I agree with Dr.      Marice Potter that she probably should wait for full bladder tacking surgery      until after she has completed her childbearing which she has to do      in the next 1-2 years.  We will let Urology continue workup and      development of plan with the patient in terms of correcting this.      She will continue imipramine and Cipro as needed.  Check TSH and      FSH today.           ______________________________  Tinnie Gens, MD     TP/MEDQ  D:  04/05/2010  T:  04/06/2010  Job:  [id number]

## 2010-08-02 NOTE — Op Note (Signed)
NAMEJULIYA, Tamara Shaffer              ACCOUNT NO.:  [id number]   MEDICAL RECORD NO.:  [id number]          PATIENT TYPE:  AMB   LOCATION:  SDC                           FACILITY:  [facility]   PHYSICIAN:  Tilda Burrow, M.D. DATE OF BIRTH:  05-Oct-1975   DATE OF PROCEDURE:  10/24/2008  DATE OF DISCHARGE:                               OPERATIVE REPORT   PREOPERATIVE DIAGNOSIS:  Left ectopic pregnancy.   POSTOPERATIVE DIAGNOSIS:  Left ectopic pregnancy.   PROCEDURE:  Laparoscopic left linear salpingectomy.   SURGEON:  Tilda Burrow, MD   ASSISTANT:  None.   ANESTHESIA:  General.   COMPLICATIONS:  None.   FINDINGS:  Distended ampullary ectopic pregnancy with large amount of  clot in the distal distended tube and bleeding and clots in pelvis and  cul-de-sac.  Thin filmy adhesions in left lower quadrant.  Normal-  appearing right tube and ovary, normal-appearing left ovary.   DETAILS OF PROCEDURE:  The patient was taken to the operating room,  prepped and draped.  Time-out conducted with the patient in low  lithotomy leg support position.  Infraumbilical vertical incision was  made as well as transverse suprapubic 1 cm incision.  Right lower  quadrant incision was made as well.  A Veress needle was introduced  through the umbilicus without difficulty in water droplet technique and  confirmed intraperitoneal location.  Pneumoperitoneum was achieved under  8-9 mm of intra-abdominal pressure.  After 3 L, CO2 was introduced,  laparoscopic trocar was attempted through the infraumbilical site.  The  patient's tissues were quite fibrotic and direct visualization  laparoscopic port trocar was used to carefully enter the abdominal  cavity.  There was no suspicion of trauma to internal organs with  visualization of the pelvis and there was some obvious of blood in the  pelvis at this time.  Attention was directed to left adnexa where  photodocumentation, photos #2 and 3 showed evidence of thin  filmy  adhesions and after movement of the patient into a Trendelenburg  position, the distal ampullary ectopic pregnancy could be easily  identified.  Gyrus needle tip cautery was then used to make a small  opening in the antimesenteric portion of the tube after percutaneous  injection of the infundibulopelvic ligament and broad ligament with 5 mL  of Marcaine with epinephrine had been performed.  The tube was opened  for a distance of approximately 1.5 cm and then the clots and tissue  consistent with ectopic were able to be extracted from the tubal lumen.  This was placed in an 11-mm EndoCatch bag placed through the suprapubic  site.  The tube was then irrigated copiously and inspected and found to  be satisfactorily hemostatic.   The specimen was removed through the suprapubic port.  Attention was  then directed to the pelvis, which was irrigated copiously and  generously.  The right tube was photodocumented as being visually normal  with fimbria visible at the end of normal right tube.  The left tube was  again inspected and considered to be hemostatic.  The patient then had  deflation of  the abdomen, removal of laparoscopic instruments, closure  of the umbilical port at the fascial level with 0-Vicryl and then  subcuticular with 0-Vicryl, closure of all 3 port sites.  The patient  tolerated procedure adequately.  During reversal of anesthesia, the  patient became somewhat agitated and vomited prior to removal of the  tube and was sedated sufficiently to place an orogastric tube and  suction out the abdomen thoroughly, then the patient was allowed to  awake and go to recovery room in a more stable condition.   ADDENDUM:  Blood type is confirmed as O positive.      Tilda Burrow, M.D.  Electronically Signed     JVF/MEDQ  D:  10/24/2008  T:  10/24/2008  Job:  [id number]

## 2010-08-05 NOTE — Assessment & Plan Note (Signed)
Tamara Shaffer, Tamara Shaffer              ACCOUNT NO.:  [id number]   MEDICAL RECORD NO.:  [id number]          PATIENT TYPE:  POB   LOCATION:  [facility] at [facility]         FACILITY:  [facility]   PHYSICIAN:  Tinnie Gens, MD        DATE OF BIRTH:  10/07/1975   DATE OF SERVICE:  06/12/2006                                  CLINIC NOTE   CHIEF COMPLAINT:  Sinus infection.   HISTORY OF PRESENT ILLNESS:  The patient is a 35 year old gravida 1, who  is at 18 weeks 3 days gestation, who comes in today with about a month  of congestion.  She has a history of seasonal allergies, but over the  last couple of weeks, she has had increasing congestion, headaches, pain  behind her left eye, and significant pain with movement.  She feels a  lot of pressure and swelling on the left side, greater than the right,  but bending over causes excruciating pain and any change in position  does the same thing.  She has noted fever to 102 at home and some nausea  and vomiting.  She has not been taking anything for this, as she was  unsure what to take while pregnant.  She does have a history of being on  claritin previously, but this does not seem to be working for her as  well.   EXAM:  Blood pressure is 128/84, pulse is 71 today.  She is afebrile in  the office.  Oropharynx is clear.  She has diffuse maxillary and upper sinus  tenderness.  Nasal turbinates are edematous and erythematous with mucoid  drainage noted.   IMPRESSION:  1. Acute sinusitis.  2. Seasonal allergies.   PLAN:  1. Z-Pak.  2. Flonase 2 sprays each nostril daily.  3. Allegra 180 one p.o. daily.  4. May try over-the-counter Sudafed as necessary to complete drainage.  5. The patient will have an OB followup ultrasound for anatomy on      June 13, 2006.  6. The patient will return for her regular OB check in 3 weeks.           ______________________________  Tinnie Gens, MD     TP/MEDQ  D:  06/12/2006  T:  06/12/2006  Job:  [id number]

## 2010-09-02 ENCOUNTER — Ambulatory Visit (INDEPENDENT_AMBULATORY_CARE_PROVIDER_SITE_OTHER): Payer: BC Managed Care – PPO | Admitting: Family Medicine

## 2010-09-02 ENCOUNTER — Encounter: Payer: Self-pay | Admitting: Family Medicine

## 2010-09-02 DIAGNOSIS — D649 Anemia, unspecified: Secondary | ICD-10-CM

## 2010-09-02 DIAGNOSIS — R5383 Other fatigue: Secondary | ICD-10-CM

## 2010-09-02 DIAGNOSIS — M79609 Pain in unspecified limb: Secondary | ICD-10-CM

## 2010-09-02 DIAGNOSIS — E039 Hypothyroidism, unspecified: Secondary | ICD-10-CM

## 2010-09-02 DIAGNOSIS — M79673 Pain in unspecified foot: Secondary | ICD-10-CM

## 2010-09-02 LAB — T4, FREE: Free T4: 1.19 ng/dL (ref 0.80–1.80)

## 2010-09-02 LAB — TSH: TSH: 1.561 u[IU]/mL (ref 0.350–4.500)

## 2010-09-02 NOTE — Assessment & Plan Note (Addendum)
Recheck TSH.  Anticipate control as good control last check 06/2010. Reviewed thyroid ultrasound results with patient - chronic thyroiditis, consistent with post-partum thyroiditis. Pt possibly interested in referral to endo for second opinion on thyroid treatment, questions about brand vs generic synthroid.  Advised up to patient if would like to let me know and we will set up.

## 2010-09-02 NOTE — Progress Notes (Signed)
Subjective:    Patient ID: Tamara Shaffer, female    DOB: 12-26-1975, 35 y.o.   MRN: [id number]  HPI CC: thyroid off?  Last menstrual cycle off a bit - over 2 months.  Started now so ok.  Still a bit irregular since after pregnancy.  Last 2 wks more tired, sleeping in, wonders if due to thyroid dysfunction.  No heat/cold intolerance, skin or hair changes, BM changes.  Last check TSH in range on levothyroxine.  Pains in feet going on 1+ year.  No h/o GDM during pregnancy but when child born, did have concern for GDM.  Usually bilateral feet hurt on entire soles, worse with first steps in am.  No swelling.  Denies burning or numbness.  + some "pins and needles".  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  No pain during day.  Taking tylenol at night prior to bedtime and 2 alleve in am.  Recently stopped imipramine 2 mo ago.  Will see urology next month.  H/o IDA, on iron in am before breakfast.  On omeprazole for GERD, notes prilosec brand works better.  Patient Active Problem List  Diagnoses  . DEPRESSION  . GASTROESOPHAGEAL REFLUX DISEASE, SEVERE  . DYSPHAGIA UNSPECIFIED  . HYPOTHYROIDISM  . STRESS INCONTINENCE  . AMENORRHEA  . NAUSEA WITH VOMITING  . ALLERGIC RHINITIS CAUSE UNSPECIFIED  . Fatigue   Past Medical History  Diagnosis Date  . GDM (gestational diabetes mellitus)   . Hypothyroidism     medically treated  . Low bladder compliance     Uro  . Insomnia   . GERD (gastroesophageal reflux disease)   . Allergic rhinitis     seasonal   Past Surgical History  Procedure Date  . Cholecystectomy   . Breast surgery 06/1994    Reconstruction  . Ectopic pregnancy surgery 10/2008    Tube remained  . Thyroid US 06/2010    heterogeneous normal sized thyroid gland consistent with chronic thyroiditis   History  Substance Use Topics  . Smoking status: Never Smoker   . Smokeless tobacco: Not on file  . Alcohol Use: Yes     Socially   Family History  Problem  Relation Age of Onset  . Hypertension Father   . Heart disease Father     Porcine Valve Replacement  . Cancer Maternal Grandmother     Ovarian cancer  . Diabetes Neg Hx   . Stroke Neg Hx   . Cancer Cousin     Thyroid cancer   Allergies  Allergen Reactions  . Erythromycin     REACTION: GI upset, rash, SOB  . Penicillins     REACTION: as child, ok with amoxicillin in past   Current Outpatient Prescriptions on File Prior to Visit  Medication Sig Dispense Refill  . Cranberry 500 MG CAPS Take 1 capsule by mouth daily.        . ferrous sulfate 325 (65 FE) MG tablet Take 325 mg by mouth daily with breakfast.        . fluticasone (FLONASE) 50 MCG/ACT nasal spray 2 sprays by Nasal route daily.        Marland Kitchen levothyroxine (SYNTHROID, LEVOTHROID) 125 MCG tablet TAKE 1 TABLET BY MOUTH ONCE A DAY FOR THYROID  30 tablet  5  . omeprazole (PRILOSEC) 20 MG capsule Take 1 tablet 45 minutes before breakfast and one tablet 45 minutes before supper.       . Prenatal Vit-Fe Fumarate-FA (PRENATAL/FOLIC ACID) TABS Take 1  tablet by mouth daily.        Marland Kitchen DISCONTD: guaiFENesin (MUCINEX) 600 MG 12 hr tablet Take 1,200 mg by mouth 2 (two) times daily as needed.        Marland Kitchen DISCONTD: imipramine (TOFRANIL) 10 MG tablet Take 2 tablets by mouth at bedtime.       Review of Systems Per HPI    Objective:   Physical Exam  Nursing note and vitals reviewed. Constitutional: She appears well-developed and well-nourished. No distress.  HENT:  Head: Normocephalic and atraumatic.  Mouth/Throat: Oropharynx is clear and moist. No oropharyngeal exudate.  Eyes: Conjunctivae and EOM are normal. Pupils are equal, round, and reactive to light. No scleral icterus.  Neck: Normal range of motion. Neck supple. No thyromegaly present.  Cardiovascular: Normal rate, regular rhythm, normal heart sounds and intact distal pulses.   No murmur heard. Pulmonary/Chest: Effort normal and breath sounds normal. No respiratory distress. She has no  wheezes. She has no rales.  Musculoskeletal:       Right foot: Normal. She exhibits normal range of motion and no tenderness.       Left foot: Normal. She exhibits normal range of motion and no tenderness.       No pain/tenderness to palpation at soles bilaterally. + flat footed bilaterally L>R.  No pain with stretching plantar fascia.  Lymphadenopathy:    She has no cervical adenopathy.  Skin: Skin is warm and dry. No rash noted.  Psychiatric: She has a normal mood and affect.          Assessment & Plan:

## 2010-09-02 NOTE — Assessment & Plan Note (Signed)
Consistent with plantar fasciitis although exam normal today. Discussed etiology and treatment. See pt instructions for plan. Update me if not improving, consider referral to [facility] or podiatry. Check cbg to help r/o DM, last meal 4 hours ago.  Last check glu normal range.

## 2010-09-02 NOTE — Assessment & Plan Note (Signed)
H/o microcytic anemia, check iron panel to eval. Lab Results  Component Value Date   WBC 14.0 07/14/2010   HGB 11.7* 07/14/2010   HCT 36 07/14/2010   MCV 74.0 07/14/2010   PLT 265 05/19/2010

## 2010-09-02 NOTE — Patient Instructions (Addendum)
Sounds like plantar fasciitis. Try heel cushions, ice water baths, anti inflammatory for pain. Update [place] if not improving in next few weeks. Blood work today to check thyroid and iron levels. Try to separate prilosec from iron, try to take iron on empty stomach. Call us with questions.  Plantar Fasciitis Plantar fasciitis is a common condition that causes foot pain. It is soreness (inflammation) of the band of tough fibrous tissue on the bottom of the foot that runs from the heel bone (calcaneus) to the ball of the foot. The cause of this soreness may be from excessive standing, poor fitting shoes, running on hard surfaces, being overweight, having an abnormal walk, or overuse (this is common in runners) of the painful foot or feet. It is also common in aerobic exercise dancers and ballet dancers.  SYMPTOMS Most people with plantar fasciitis complain of:  Severe pain in the morning on the bottom of their foot especially when taking the first steps out of bed. This pain recedes after a few minutes of walking.   Severe pain is experienced also during walking following a long period of inactivity.   Pain is worse when walking barefoot or up stairs  DIAGNOSIS  Your caregiver will diagnose this condition by examining and feeling your foot.   Special tests such as x-rays of your foot, are usually not needed.  PREVENTION  Consult a sports medicine professional before beginning a new exercise program.   Walking programs offer a good workout. With walking there is a lower chance of overuse injuries common to runners. There is less impact and less jarring of the joints.   Begin all new exercise programs slowly. If problems or pain develop, decrease the amount of time or distance until you are at a comfortable level.   Wear good shoes and replace them regularly.   Stretch your foot and the heel cords at the back of the ankle (Achilles tendon) both before and after exercise.   Run or exercise  on even surfaces that are not hard. For example, asphalt is better than pavement.   Do not run barefoot on hard surfaces.   If using a treadmill, vary the incline.   Do not continue to workout if you have foot or joint problems. Seek professional help if they do not improve.  HOME CARE INSTRUCTIONS  Avoid activities that cause you pain until you recover.   Use ice or cold packs on the problem or painful areas after working out.   Only take over-the-counter or prescription medicines for pain, discomfort, or fever as directed by your caregiver.   Soft shoe inserts or athletic shoes with air or gel sole cushions may be helpful.   If problems continue or become more severe, consult a sports medicine caregiver or your own health care provider. Cortisone is a potent anti-inflammatory medication that may be injected into the painful area. You can discuss this treatment with your caregiver.  MAKE SURE YOU:  Understand these instructions.   Will watch your condition.   Will get help right away if you are not doing well or get worse.  Document Released: 11/29/2000 Document Re-Released: 05/31/2009 [facility] Patient Information 2011 [place], [place].

## 2010-09-03 LAB — IRON: Iron: 21 ug/dL — ABNORMAL LOW (ref 42–145)

## 2010-09-03 LAB — IBC PANEL
TIBC: 417 ug/dL (ref 250–470)
UIBC: 396 ug/dL

## 2010-09-05 NOTE — Progress Notes (Signed)
Addended by: Alvina Chou on: 09/05/2010 08:12 AM   Modules accepted: Orders

## 2010-09-19 ENCOUNTER — Encounter: Payer: Self-pay | Admitting: Family Medicine

## 2010-09-30 NOTE — Assessment & Plan Note (Signed)
Tamara Shaffer, Tamara Shaffer              ACCOUNT NO.:  [id number]  MEDICAL RECORD NO.:  [id number]          PATIENT TYPE:  POB  LOCATION:  [facility]         FACILITY:  [facility]  PHYSICIAN:  Allie Bossier, MD        DATE OF BIRTH:  September 05, 1975  DATE OF SERVICE:  06/14/2010                                 CLINIC NOTE  Tamara Shaffer is a 35 year old married white G3, P2-0-1-2, who comes in today with a "crotch crisis."  She said that since Thursday (5 days ago), she has been having some vulvar burning.  She found a prescription of Macrobid at home, so she decided she would start taking that.  She also tried Vagisil 3 days ago.  She is still having some vulvar burning and itching.  PHYSICAL EXAMINATION:  Her vulva is slightly erythematous.  Her vaginal discharge appears relatively normal.  However, on sitting for wet prep, I am also sending urine culture.  I have given her prescription for Diflucan 150 mg to be taken once.  In the meantime, I will send a wet prep and will treat anything else accordingly.     Allie Bossier, MD    MCD/MEDQ  D:  06/14/2010  T:  06/15/2010  Job:  [id number]

## 2010-10-22 ENCOUNTER — Telehealth: Payer: Self-pay | Admitting: Family Medicine

## 2010-10-22 NOTE — Telephone Encounter (Signed)
I see pt scheduled to see me Wednesday to discuss endo referral. We've discussed this in past.  If she would like, I will place referral and pt doesn't have to come in.   If she would like to discuss other issues, ok to keep appt Wednesday.

## 2010-10-24 NOTE — Telephone Encounter (Signed)
Message left notifying patient that she doesn't need the appt on Wednesday unless she has other issues to discuss. I advised to call and let me know and you will set her endo referral up for her without the office visit.

## 2010-10-25 ENCOUNTER — Telehealth: Payer: Self-pay | Admitting: *Deleted

## 2010-10-25 DIAGNOSIS — E039 Hypothyroidism, unspecified: Secondary | ICD-10-CM

## 2010-10-25 NOTE — Telephone Encounter (Signed)
Forwarded to [employer] for referral.

## 2010-10-25 NOTE — Telephone Encounter (Signed)
Please see previous phone note.  Will refer to endo for second opinion on thyroid.  Please fax last office visit as well as thyroid functions and thyroid ultrasound.

## 2010-10-25 NOTE — Telephone Encounter (Signed)
Patient says she had an appt to come in for an OV to get a referral but was advised yesterday that she did not need that appt.  She has canceled the appt but does want to proceed with the referral, please.

## 2010-10-26 ENCOUNTER — Ambulatory Visit: Payer: BC Managed Care – PPO | Admitting: Family Medicine

## 2010-10-26 ENCOUNTER — Telehealth: Payer: Self-pay | Admitting: *Deleted

## 2010-10-26 NOTE — Telephone Encounter (Signed)
Opened in error

## 2010-10-26 NOTE — Telephone Encounter (Signed)
Patient called back and says that she prefers going to [facility]. Their number is [phone].

## 2010-11-19 ENCOUNTER — Other Ambulatory Visit: Payer: Self-pay | Admitting: Family Medicine

## 2010-11-30 ENCOUNTER — Encounter: Payer: Self-pay | Admitting: Family Medicine

## 2010-12-13 ENCOUNTER — Encounter: Payer: Self-pay | Admitting: Family Medicine

## 2010-12-30 LAB — CBC
HCT: 35.7 — ABNORMAL LOW
Hemoglobin: 12
MCV: 84.1
RDW: 15.3 — ABNORMAL HIGH

## 2010-12-30 LAB — COMPREHENSIVE METABOLIC PANEL
Alkaline Phosphatase: 100
BUN: 8
Creatinine, Ser: 0.67
Glucose, Bld: 109 — ABNORMAL HIGH
Potassium: 3.9
Total Bilirubin: 0.5
Total Protein: 6.5

## 2010-12-30 LAB — RPR: RPR Ser Ql: NONREACTIVE

## 2011-01-19 ENCOUNTER — Other Ambulatory Visit: Payer: Self-pay | Admitting: Family Medicine

## 2011-02-08 ENCOUNTER — Ambulatory Visit (INDEPENDENT_AMBULATORY_CARE_PROVIDER_SITE_OTHER): Payer: BC Managed Care – PPO | Admitting: Obstetrics & Gynecology

## 2011-02-08 ENCOUNTER — Encounter: Payer: Self-pay | Admitting: Obstetrics & Gynecology

## 2011-02-08 ENCOUNTER — Ambulatory Visit: Payer: BC Managed Care – PPO | Admitting: Family Medicine

## 2011-02-08 VITALS — BP 144/97 | HR 86 | Ht 63.0 in | Wt 220.0 lb

## 2011-02-08 DIAGNOSIS — N760 Acute vaginitis: Secondary | ICD-10-CM

## 2011-02-08 DIAGNOSIS — N390 Urinary tract infection, site not specified: Secondary | ICD-10-CM

## 2011-02-08 DIAGNOSIS — N762 Acute vulvitis: Secondary | ICD-10-CM

## 2011-02-08 LAB — POCT URINALYSIS DIPSTICK
Bilirubin, UA: NEGATIVE
Leukocytes, UA: NEGATIVE
Nitrite, UA: NEGATIVE
Protein, UA: NEGATIVE

## 2011-02-08 NOTE — Patient Instructions (Signed)
Vaginitis Vaginitis is an infection. It causes soreness, swelling, and redness (inflammation) of the vagina. Many of these infections are sexually transmitted diseases. Having unprotected sex can cause further problems and complications such as:  Chronic pelvic pain.     Infertility.    Unwanted pregnancy.     Abortions.    Tubal pregnancy.     Infection passed on to the newborn baby.     Cancer.  CAUSES   Monilia. A yeast/fungus infection, not a sexually transmitted disease.     Bacterial vaginosis. The normal balance of bacteria in the vagina is disrupted and is replaced by an overgrowth of certain bacteria.     Gonorrhea, chlamydia. These are bacterial infections that are sexually transmitted diseases (STD).     Vaginal sponges, diaphragms, and intrauterine devices.     Trichomoniasis. This is a protozoa parasite that is a STD.     Viruses like herpes and human papilloma virus. Both are STD's.     Pregnancy.    Immunosupression (HIV).     Using bubble bath.     Taking certain medications that kill germs (antibiotics).     Sporadic recurrence can occur if you become ill.     Diabetes.    Steriods.    Allergic reaction. If you have an allergy to:     Douches.    Soaps.    Spermicides.    Condoms.    Scented tampons and vaginal sprays.  SYMPTOMS    Abnormal vaginal discharge.     Itching of the vagina.     Pain in the vagina.     Swelling of the vagina.     In some cases, there are no symptoms.  TREATMENT   Treatment will vary depending on the type of infection.  Bacteria and trichomonas - usually oral antibiotics and sometimes vaginal cream or suppositories.     Monilia vaginitis - vaginal creams, suppositories, or oral antifungal.     Viral vaginitis has no cure. However, the symptoms of herpes (a viral vaginitis) can be treated to relieve the discomfort. Human papilloma virus has no symptoms . However, there are treatments for the diseases  caused by human papilloma virus.     Allergic vaginitis. Stop using the product that is causing the problem. Vaginal creams can be used to treat the symptoms.     When treating an STD, the sex partner should also be treated.  HOME CARE INSTRUCTIONS    Take all the medications as prescribed by your caregiver.     Do not use scented tampons, soaps, or vaginal sprays.     Do not douche.     Tell your sex partner if you have a vaginal infection or a STD.     Do not have sexual intercourse until you have cured the vaginitis.     Practice safe sex by using condoms .  SEEK MEDICAL CARE IF:    You have an oral temperature above 102 F (38.9 C).     You develop abdominal pain.     Your symptoms get worse during treatment.  Document Released: 01/01/2007 Document Revised: 09/19/2010 Document Reviewed: 08/27/2008 [employer] Patient Information 2012 [employer]. 

## 2011-02-08 NOTE — Progress Notes (Signed)
Patient is complaining of increased back pain and pain/irritation with urination.  This started Friday.  She has been on antibiotics for bronchitis.

## 2011-02-08 NOTE — Progress Notes (Signed)
History:  35 y.o. F here today for dysuria, vulvar irritation and back pain.  Udip was negative.  Denies any vaginal discharge.  The following portions of the patient's history were reviewed and updated as appropriate: allergies, current medications, past family history, past medical history, past social history, past surgical history and problem list.   Objective:  Physical Exam Blood pressure 144/97, pulse 86, height 5\' 3"  (1.6 m), weight 220 lb (99.791 kg). Gen: NAD Abd: Soft, nontender and nondistended Pelvic: Normal appearing external genitalia; normal appearing vaginal mucosa and cervix.  Normal appearing discharge, wet prep taken.  Small uterus, no palpable masses or adnexal tenderness.  Assessment & Plan:   Follow up wet prep results and manage accordingly. Continue to monitor symptoms.

## 2011-02-09 LAB — WET PREP, GENITAL
Clue Cells Wet Prep HPF POC: NONE SEEN
Trich, Wet Prep: NONE SEEN
WBC, Wet Prep HPF POC: NONE SEEN
Yeast Wet Prep HPF POC: NONE SEEN

## 2011-02-28 ENCOUNTER — Encounter: Payer: Self-pay | Admitting: Family Medicine

## 2011-02-28 ENCOUNTER — Ambulatory Visit (INDEPENDENT_AMBULATORY_CARE_PROVIDER_SITE_OTHER): Payer: BC Managed Care – PPO | Admitting: Family Medicine

## 2011-02-28 VITALS — BP 110/78 | HR 88 | Temp 98.7°F | Wt 216.5 lb

## 2011-02-28 DIAGNOSIS — J40 Bronchitis, not specified as acute or chronic: Secondary | ICD-10-CM

## 2011-02-28 MED ORDER — AZITHROMYCIN 250 MG PO TABS: ORAL_TABLET | ORAL | Status: AC

## 2011-02-28 MED ORDER — HYDROCOD POLST-CHLORPHEN POLST 10-8 MG/5ML PO LQCR: 5.0000 mL | Freq: Every evening | ORAL | Status: DC | PRN

## 2011-02-28 NOTE — Progress Notes (Signed)
  Subjective:    Patient ID: Tamara Shaffer, female    DOB: [birth year], 35 y.o.   MRN: [id number]  HPI CC: sinus congestion  Seen 01/30/2011 at [facility] with dx URI, treated with biaxin and tussionex, didn't fully improve so returned 02/14/2011 and told had bronchitis, placed on prednisone and albuterol inhaler (inhaler didn't help).  Continued mucinex.  Now sxs returning - having sinus pressure.  Never fully cleared.  Continued chest tightness, trouble sleeping at night 2/2 cough.  cough did improve some, now back.  Continued cough productive of small amt green/yellow phlegm.  + myalgia, arthralgia from coughing.  Chest staying very tight.  + sinus pressure HA.  Congestion more in chest than in head.  Eyes itching, not red.  Antibiotic eye docs.  Sunday with mild matting in am of right eye, none since.  + sick children (pink eye and sinusitis) and husband at home.  No fevers/chills, abd pain, n/v, rashes, ear pain or tooth pain.    Allergies acting up.  No smokers at home.  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  Review of Systems Per HPI    Objective:   Physical Exam  Nursing note and vitals reviewed. Constitutional: She appears well-developed and well-nourished. No distress.       Congested, hoarse, dry cough present  HENT:  Head: Normocephalic and atraumatic.  Right Ear: Hearing, tympanic membrane, external ear and ear canal normal.  Left Ear: Hearing, tympanic membrane, external ear and ear canal normal.  Nose: No mucosal edema or rhinorrhea. Right sinus exhibits maxillary sinus tenderness. Right sinus exhibits no frontal sinus tenderness. Left sinus exhibits maxillary sinus tenderness. Left sinus exhibits no frontal sinus tenderness.  Mouth/Throat: Uvula is midline, oropharynx is clear and moist and mucous membranes are normal. No oropharyngeal exudate, posterior oropharyngeal edema, posterior oropharyngeal erythema or tonsillar abscesses.  Eyes: Conjunctivae and EOM are normal. Pupils are equal, round,  and reactive to light. No scleral icterus.  Neck: Normal range of motion. Neck supple.  Cardiovascular: Normal rate, regular rhythm, normal heart sounds and intact distal pulses.   No murmur heard. Pulmonary/Chest: Effort normal and breath sounds normal. No respiratory distress. She has no wheezes. She has no rales.  Lymphadenopathy:    She has no cervical adenopathy.  Skin: Skin is warm and dry. No rash noted.      Assessment & Plan:

## 2011-02-28 NOTE — Assessment & Plan Note (Signed)
Going on 5 wks now, not fully improved.  Initially treatd with biaxin. Anticipate continued bronchitis. Treat with zpack and tussionex for cough at night. Update [place] if sxs not improving. rec restart nasal saline and flonase. Has taken zpack fine in past.

## 2011-02-28 NOTE — Patient Instructions (Addendum)
I think you have prolonged bronchitis.  Lungs sounding clear today Given symptoms going on 5 weeks, treat with zpack and tussionex for cough at night (may make you sleepy) Restart nasal saline and flonase for sinus congestion. Update [place] if not improving as expected. Push fluids and rest.  May take mucinex to break up and mobilize mucous (with plenty of water)

## 2011-03-10 ENCOUNTER — Telehealth: Payer: Self-pay | Admitting: *Deleted

## 2011-03-10 MED ORDER — AZITHROMYCIN 250 MG PO TABS: ORAL_TABLET | ORAL | Status: AC

## 2011-03-10 NOTE — Telephone Encounter (Signed)
Patient notified

## 2011-03-10 NOTE — Telephone Encounter (Signed)
Will extend zpack another course.

## 2011-03-10 NOTE — Telephone Encounter (Signed)
Patient called and said she finished the zpack but is still no better. She is still using the tussionex and the flonase, but asks if she can extend the zpack or try another abx. She said it normally takes 2 courses for her.

## 2011-05-22 ENCOUNTER — Encounter: Payer: Self-pay | Admitting: Obstetrics & Gynecology

## 2011-05-22 ENCOUNTER — Ambulatory Visit (INDEPENDENT_AMBULATORY_CARE_PROVIDER_SITE_OTHER): Payer: BC Managed Care – PPO | Admitting: Obstetrics & Gynecology

## 2011-05-22 DIAGNOSIS — Z1502 Genetic susceptibility to malignant neoplasm of ovary: Secondary | ICD-10-CM

## 2011-05-22 DIAGNOSIS — Z01419 Encounter for gynecological examination (general) (routine) without abnormal findings: Secondary | ICD-10-CM

## 2011-05-22 DIAGNOSIS — Z0000 Encounter for general adult medical examination without abnormal findings: Secondary | ICD-10-CM

## 2011-05-22 DIAGNOSIS — Z9189 Other specified personal risk factors, not elsewhere classified: Secondary | ICD-10-CM

## 2011-05-22 DIAGNOSIS — Z789 Other specified health status: Secondary | ICD-10-CM

## 2011-05-22 DIAGNOSIS — Z23 Encounter for immunization: Secondary | ICD-10-CM

## 2011-05-22 DIAGNOSIS — E079 Disorder of thyroid, unspecified: Secondary | ICD-10-CM

## 2011-05-22 NOTE — Progress Notes (Signed)
Subjective:    Tamara Shaffer is a 36 y.o. female who presents for an annual exam. The patient has no complaints today. She would like her TSH checked. The patient is sexually active. GYN screening history: last pap: was normal. The patient wears seatbelts: yes. The patient participates in regular exercise: no. Has the patient ever been transfused or tattooed?: no. The patient reports that there is not domestic violence in her life.   Menstrual History: OB History    Grav Para Term Preterm Abortions TAB SAB Ect Mult Living   2 2        2       Menarche age: 8 Patient's last menstrual period was 04/28/2011.    The following portions of the patient's history were reviewed and updated as appropriate: allergies, current medications, past family history, past medical history, past social history, past surgical history and problem list.  Review of Systems Pertinent items are noted in HPI.  She is a Engineer, site. She uses condoms for birth control and is considering pregnancy next year.  Objective:    BP 134/80  Pulse 85  Ht 5\' 3"  (1.6 m)  Wt 227 lb (102.967 kg)  BMI 40.21 kg/m2  LMP 04/28/2011  General Appearance:    Alert, cooperative, no distress, appears stated age  Head:    Normocephalic, without obvious abnormality, atraumatic  Eyes:    PERRL, conjunctiva/corneas clear, EOM's intact, fundi    benign, both eyes  Ears:    Normal TM's and external ear canals, both ears  Nose:   Nares normal, septum midline, mucosa normal, no drainage    or sinus tenderness  Throat:   Lips, mucosa, and tongue normal; teeth and gums normal  Neck:   Supple, symmetrical, trachea midline, no adenopathy;    thyroid:  no enlargement/tenderness/nodules; no carotid   bruit or JVD  Back:     Symmetric, no curvature, ROM normal, no CVA tenderness  Lungs:     Clear to auscultation bilaterally, respirations unlabored  Chest Wall:    No tenderness or deformity   Heart:    Regular rate and rhythm, S1 and S2  normal, no murmur, rub   or gallop  Breast Exam:    No tenderness, masses, or nipple abnormality  Abdomen:     Soft, non-tender, bowel sounds active all four quadrants,    no masses, no organomegaly  Genitalia:    Normal female without lesion, discharge or tenderness, uterus upper limit of normal size, NT, no adnexal masses     Extremities:   Extremities normal, atraumatic, no cyanosis or edema  Pulses:   2+ and symmetric all extremities  Skin:   Skin color, texture, turgor normal, no rashes or lesions  Lymph nodes:   Cervical, supraclavicular, and axillary nodes normal  Neurologic:   CNII-XII intact, normal strength, sensation and reflexes    throughout  .    Assessment:    Healthy female exam.    Plan:     Pap smear.  She has requested BRCA1/2 testing.

## 2011-08-22 ENCOUNTER — Telehealth: Payer: Self-pay | Admitting: *Deleted

## 2011-08-22 DIAGNOSIS — K219 Gastro-esophageal reflux disease without esophagitis: Secondary | ICD-10-CM

## 2011-08-22 MED ORDER — OMEPRAZOLE 20 MG PO CPDR: 20.0000 mg | DELAYED_RELEASE_CAPSULE | Freq: Two times a day (BID) | ORAL | Status: DC

## 2011-08-22 NOTE — Telephone Encounter (Signed)
Prior authorization was obtained for Omeprazole bid for one year.

## 2011-09-08 ENCOUNTER — Encounter: Payer: Self-pay | Admitting: Obstetrics & Gynecology

## 2011-10-18 ENCOUNTER — Ambulatory Visit (INDEPENDENT_AMBULATORY_CARE_PROVIDER_SITE_OTHER): Payer: BC Managed Care – PPO | Admitting: Family Medicine

## 2011-10-18 ENCOUNTER — Encounter: Payer: Self-pay | Admitting: Family Medicine

## 2011-10-18 VITALS — BP 118/72 | HR 68 | Temp 98.2°F | Wt 223.5 lb

## 2011-10-18 DIAGNOSIS — M461 Sacroiliitis, not elsewhere classified: Secondary | ICD-10-CM | POA: Insufficient documentation

## 2011-10-18 MED ORDER — NAPROXEN 500 MG PO TABS: ORAL_TABLET | ORAL | Status: AC

## 2011-10-18 MED ORDER — CYCLOBENZAPRINE HCL 10 MG PO TABS: 10.0000 mg | ORAL_TABLET | Freq: Two times a day (BID) | ORAL | Status: AC | PRN

## 2011-10-18 NOTE — Patient Instructions (Signed)
You have left sacroiliitis. Treat with ice/heat (whichever soothes better), naprosyn 500mg  twice daily and flexeril twice daily as needed (this will make you sleepy, may cut in half). Do stretching exercises provided. If not improving, let me know.

## 2011-10-18 NOTE — Assessment & Plan Note (Signed)
Left sided Treat conservatively with NSAIDs, flexeril, ice/heat, and stretching exercises from [facility] pt advisor. Discussed if not better consider ortho referral for SIJ injection.

## 2011-10-18 NOTE — Progress Notes (Signed)
  Subjective:    Patient ID: Tamara Shaffer, female    DOB: Mar 31, 1975, 36 y.o.   MRN: [id number]  HPI CC: lower back pain  4 wks ago having significant back pain.  Went on for 4 days, lots of aleve helpled resolve sxs.  H/o lower back pain and sciatica with both children during pregnancy.  Then this week started having lower back pain.  Worse last night, waking her up from sleep.  Feels spasm in back.  Sitting does well.  Laying down does well.  Worse with standing and walking.  Has taken tylenol, aleve, heating pad last night.  Denies fevers/chills, abd pain, shooting pain down leg, paresthesias, numbness or tingling down legs, weakness, saddle anesthesia.  Denies inciting trauma/injury.  Review of Systems Per HPI    Objective:   Physical Exam  Nursing note and vitals reviewed. Constitutional: She is oriented to person, place, and time. She appears well-developed and well-nourished. No distress.       Uncomfortable with position changes  Musculoskeletal: She exhibits no edema.       No midline lumber spine tenderness or significant paraspinous mm tenderness/tightness. Neg SLR bilaterally. No pain with int/ext rotation at hips. + FABER L>R + pain with palpation of L SIJ No pain with GTB or sciatic notch palpation.  Neurological: She is alert and oriented to person, place, and time. She has normal strength. No sensory deficit. Gait (antalgic) abnormal. Coordination normal.       Able to heel and toe walk.  Skin: Skin is warm and dry. No rash noted.       Assessment & Plan:

## 2011-12-05 ENCOUNTER — Other Ambulatory Visit (INDEPENDENT_AMBULATORY_CARE_PROVIDER_SITE_OTHER): Payer: BC Managed Care – PPO | Admitting: *Deleted

## 2011-12-05 DIAGNOSIS — R5383 Other fatigue: Secondary | ICD-10-CM

## 2011-12-05 DIAGNOSIS — R5381 Other malaise: Secondary | ICD-10-CM

## 2011-12-05 DIAGNOSIS — R946 Abnormal results of thyroid function studies: Secondary | ICD-10-CM

## 2011-12-05 NOTE — Progress Notes (Signed)
Patient would like to repeat BRCA testing due to family history and would also like to do hga1c and tsh, free t4, and total t3 from her endocrinologist that she sees at [facility] since she is having her blood drawn.  The fax number to this office is [phone], it is Dr. Tedd Sias at [place] [facility].

## 2011-12-05 NOTE — Addendum Note (Signed)
Addended by: Barbara Cower on: 12/05/2011 05:13 PM   Modules accepted: Orders

## 2011-12-08 LAB — TSH+FREE T4
Free T4: 1.4 ng/dL (ref 0.82–1.77)
TSH: 1.77 u[IU]/mL (ref 0.450–4.500)

## 2011-12-08 LAB — HEMOGLOBIN A1C: Est. average glucose Bld gHb Est-mCnc: 123 mg/dL

## 2011-12-20 ENCOUNTER — Telehealth: Payer: Self-pay

## 2012-02-09 IMAGING — US US PELVIS LIMITED
1 series · 14 of 18 positions shown · non-contrast
Comparison: OB ultrasound 06/23/2009

CLINICAL DATA: Right lower quadrant pelvic pain.  28 weeks
pregnant.

TRANSVAGINAL ULTRASOUND OF PELVIS
TECHNIQUE: Transvaginal ultrasound examination of the pelvis was
performed including evaluation of the uterus, ovaries, adnexal
regions, and pelvic cul-de-sac.

[Series 1: us pelvis limited · 0.19mm/px · 14 of 18 slices shown]
[im 1/18]
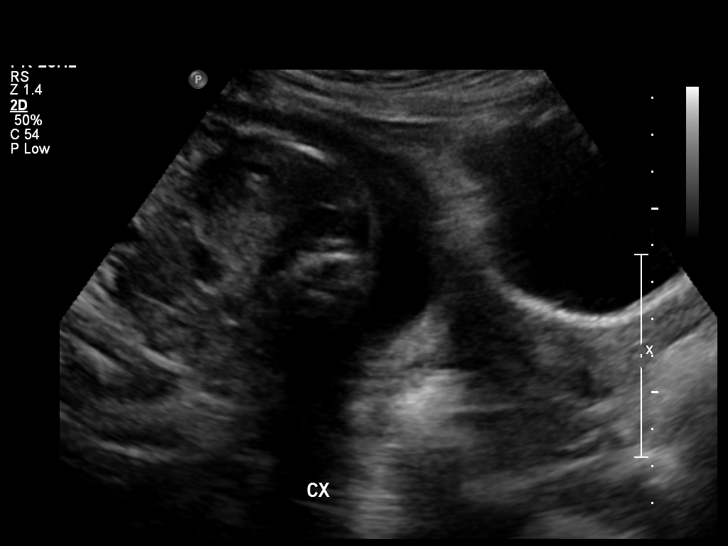
[im 2/18]
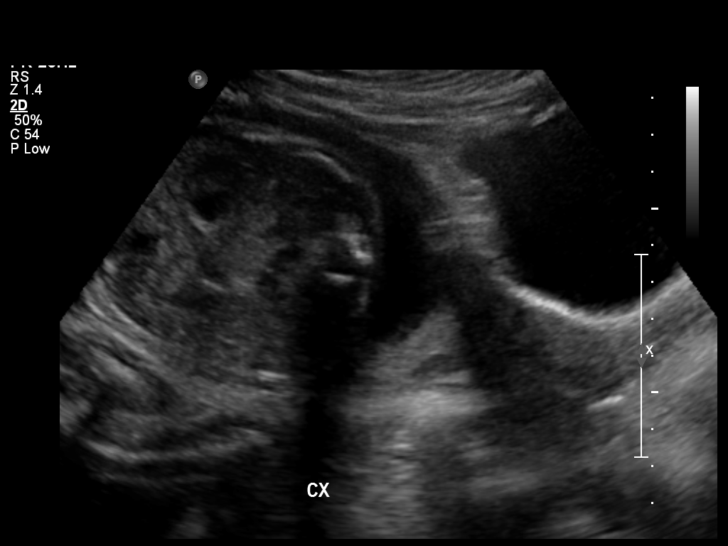
[im 4/18]
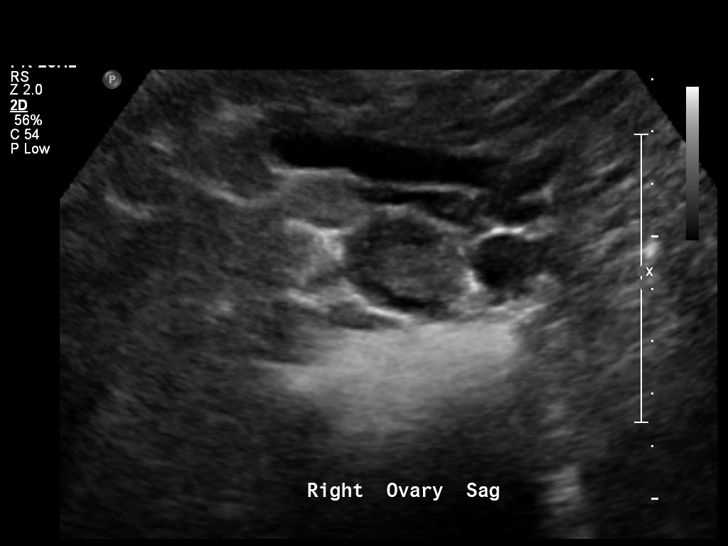
[im 5/18]
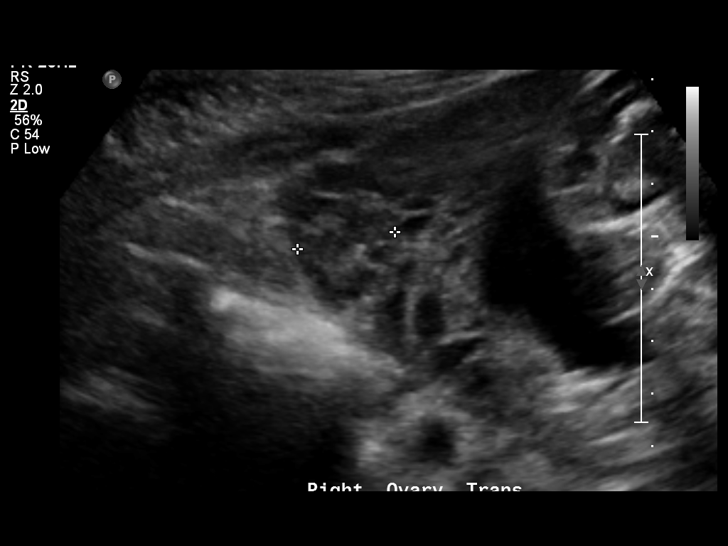
[im 6/18]
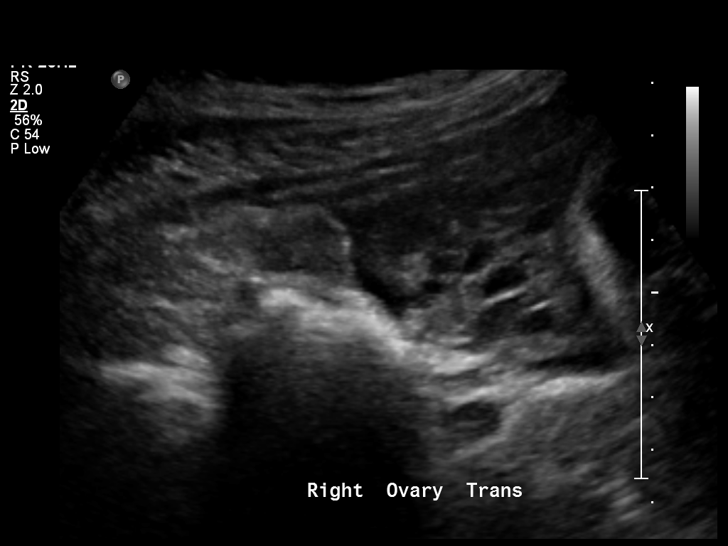
[im 8/18]
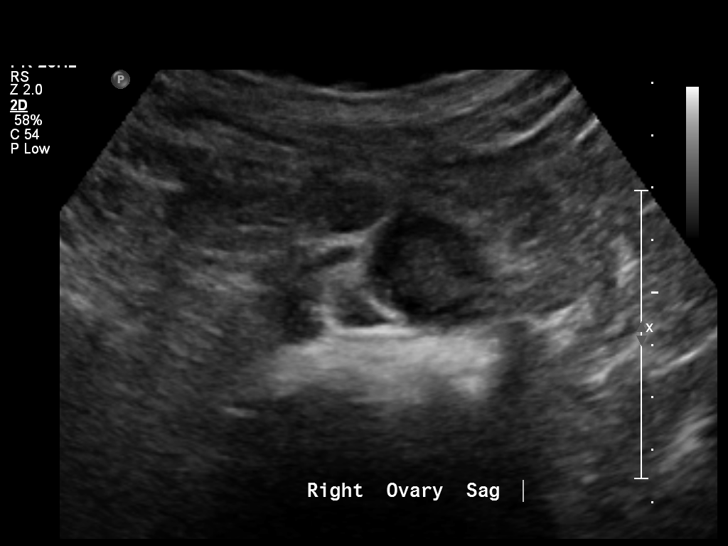
[im 9/18]
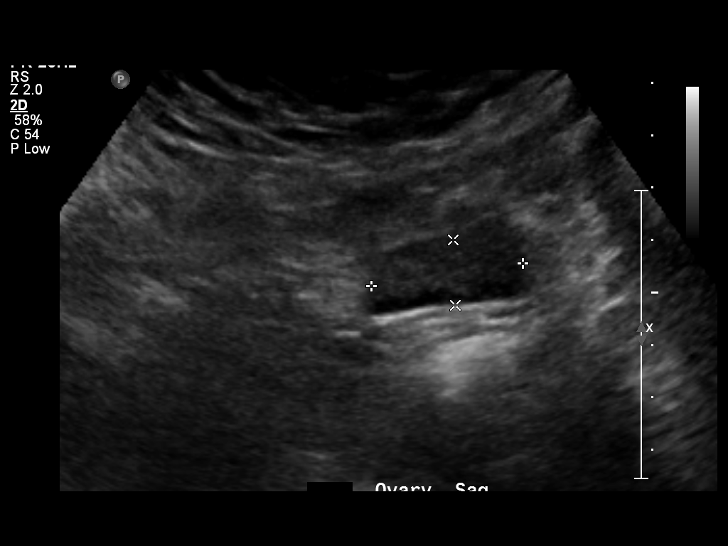
[im 10/18]
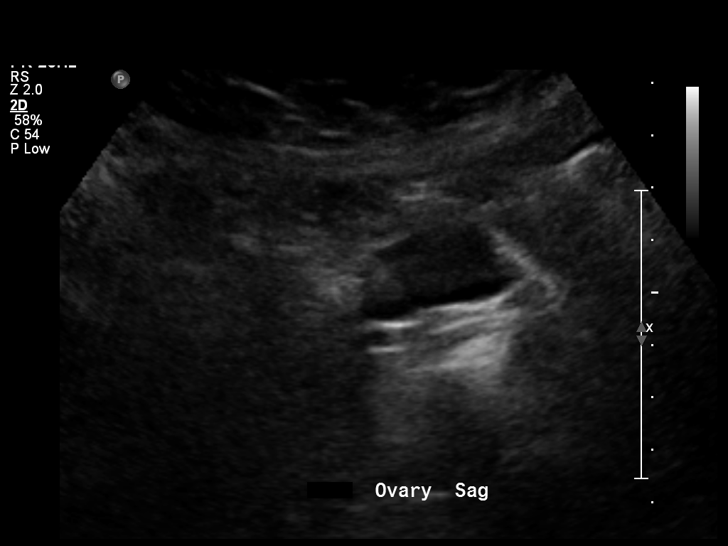
[im 11/18]
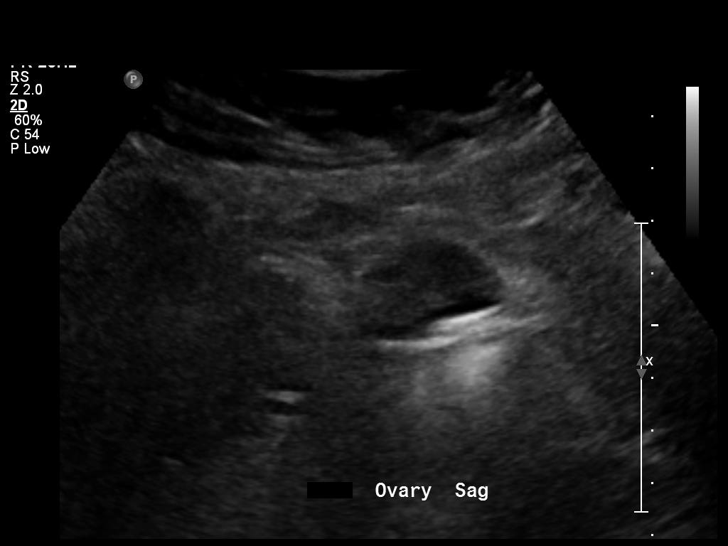
[im 13/18]
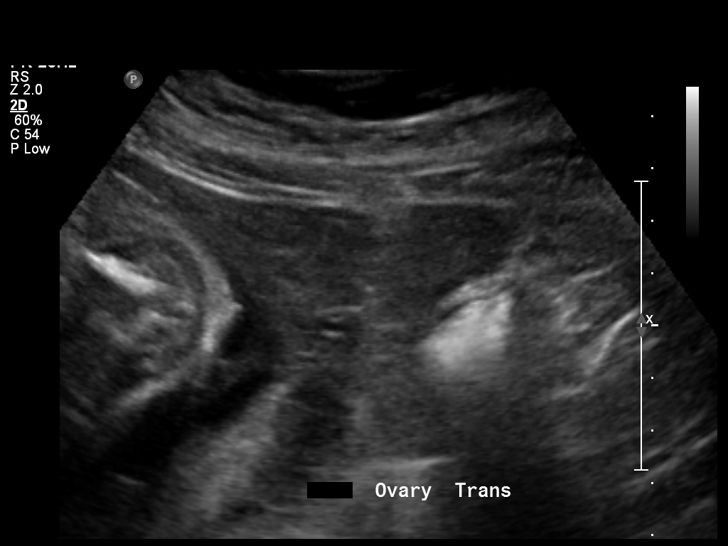
[im 14/18]
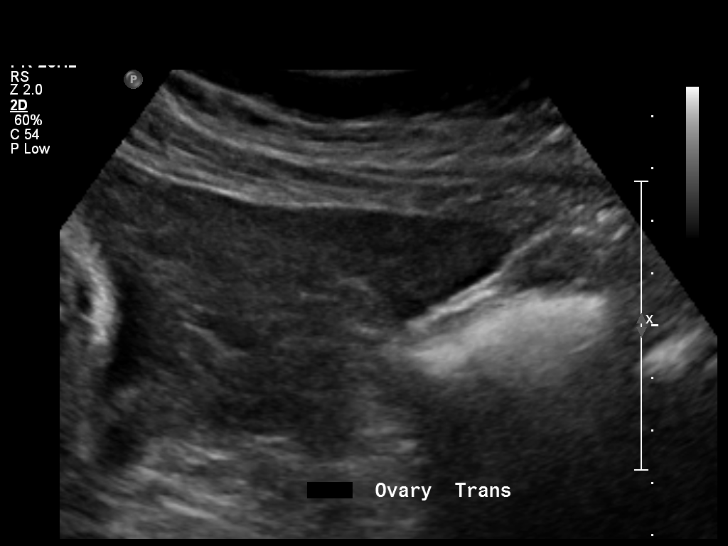
[im 15/18]
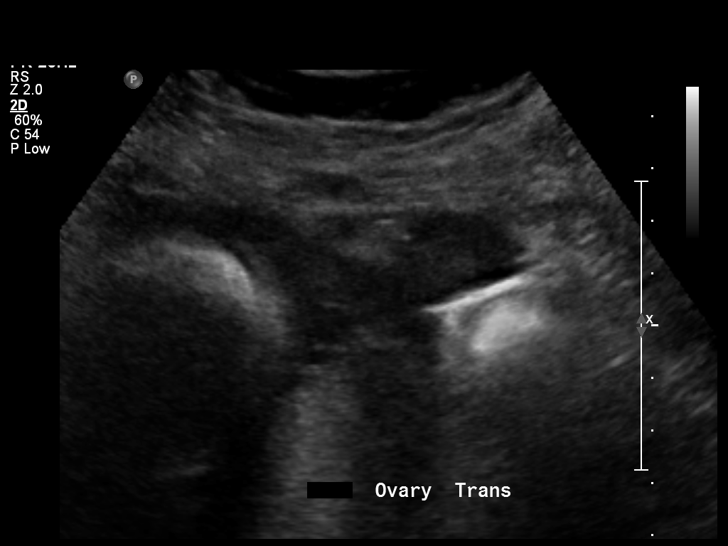
[im 17/18]
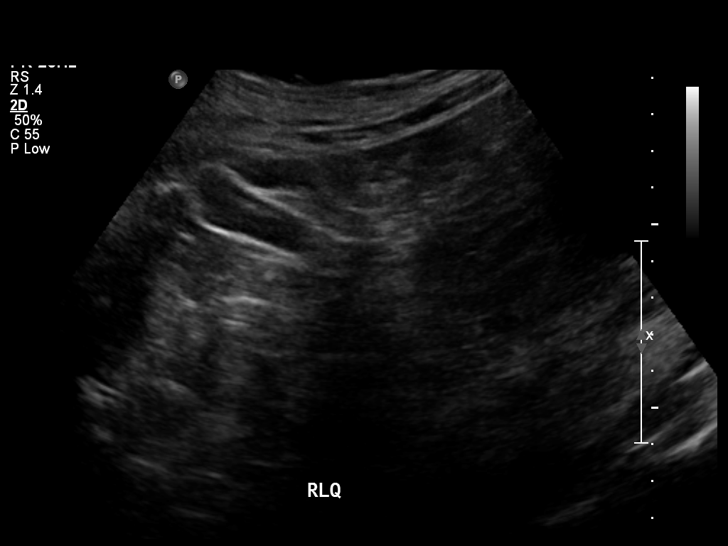
[im 18/18]
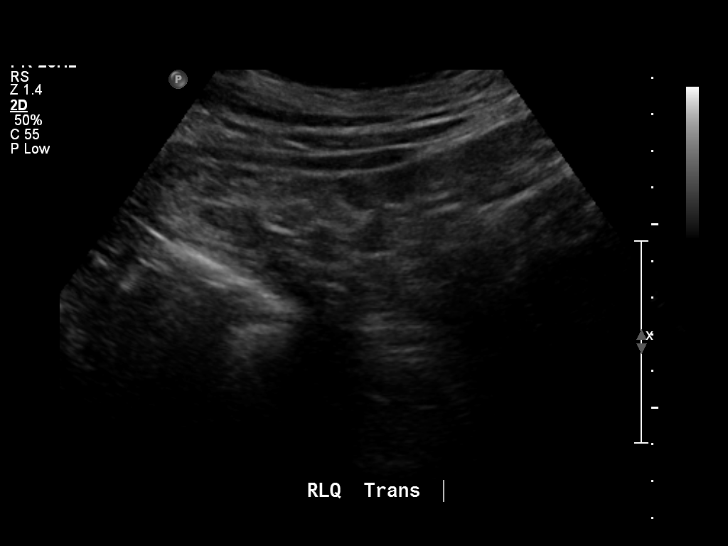

[14 of 18 positions shown; findings below may reference images not displayed]

FINDINGS: Uterus not evaluated (recent OB ultrasound performed)

Endometrium not visualized (due to gestational sac)

Right Ovary 2.3 x 1.9 x 1.8 cm.  Normal.

Left Ovary 2.9 x 1.4 x 1.2 cm, normal.

Other Findings:  No free fluid.
IMPRESSION: Normal ovaries bilaterally.

## 2012-02-17 ENCOUNTER — Other Ambulatory Visit: Payer: Self-pay | Admitting: Family Medicine

## 2012-04-08 ENCOUNTER — Encounter: Payer: Self-pay | Admitting: Obstetrics & Gynecology

## 2012-05-13 ENCOUNTER — Other Ambulatory Visit: Payer: Self-pay | Admitting: Family Medicine

## 2012-05-13 MED ORDER — LEVOTHYROXINE SODIUM 125 MCG PO TABS: 125.0000 ug | ORAL_TABLET | Freq: Every day | ORAL | Status: DC

## 2012-07-02 ENCOUNTER — Institutional Professional Consult (permissible substitution): Payer: BC Managed Care – PPO | Admitting: Nurse Practitioner

## 2012-08-06 ENCOUNTER — Ambulatory Visit (INDEPENDENT_AMBULATORY_CARE_PROVIDER_SITE_OTHER): Payer: BC Managed Care – PPO | Admitting: Nurse Practitioner

## 2012-08-06 ENCOUNTER — Encounter: Payer: Self-pay | Admitting: Nurse Practitioner

## 2012-08-06 DIAGNOSIS — G43009 Migraine without aura, not intractable, without status migrainosus: Secondary | ICD-10-CM

## 2012-08-06 MED ORDER — VENLAFAXINE HCL ER 37.5 MG PO CP24: 37.5000 mg | ORAL_CAPSULE | Freq: Three times a day (TID) | ORAL | Status: DC

## 2012-08-06 MED ORDER — NAPROXEN SODIUM 550 MG PO TABS: 550.0000 mg | ORAL_TABLET | Freq: Two times a day (BID) | ORAL | Status: DC

## 2012-08-06 MED ORDER — SUMATRIPTAN SUCCINATE 100 MG PO TABS: 100.0000 mg | ORAL_TABLET | Freq: Once | ORAL | Status: DC | PRN

## 2012-08-06 NOTE — Progress Notes (Signed)
Diagnosis: Migraine without aura  History: Pt has had migraine without aura since college. The headaches have gotten worse since November when she came off her Imipramine for bladder control. 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 She has a history of depression and anxiety and feels that she is sliding back into that and her migraines are worse. She has only been taking excedrin migraine OTC.   Location: Right temple  Number of Headache days/month: Severe: 1 Moderate:2 Mild:2  Current Outpatient Prescriptions on File Prior to Visit  Medication Sig Dispense Refill  . ferrous sulfate 325 (65 FE) MG tablet Take 325 mg by mouth daily with breakfast.        . fluticasone (FLONASE) 50 MCG/ACT nasal spray 2 sprays by Nasal route daily.        Marland Kitchen levothyroxine (SYNTHROID) 125 MCG tablet Take 1 tablet (125 mcg total) by mouth daily.  90 tablet  3  . Multiple Vitamin (MULTIVITAMIN) tablet Take 1 tablet by mouth daily.        . naproxen (NAPROSYN) 500 MG tablet Take one po bid x 1 week then prn pain, take with food  60 tablet  0  . omeprazole (PRILOSEC) 20 MG capsule Take 1 capsule (20 mg total) by mouth 2 (two) times daily.  180 capsule  3  . Cranberry 500 MG CAPS Take 1 capsule by mouth daily.         No current facility-administered medications on file prior to visit.    Acute prevention: None.   Past Medical History  Diagnosis Date  . GDM (gestational diabetes mellitus)   . Hypothyroidism     medically treated  . 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 Insomnia   . GERD (gastroesophageal reflux disease)   . Allergic rhinitis     seasonal  . Prediabetes     A1c 5.8% 11/2010  . Vulvar burning   . Urinary incontinence   . Obesity    Past Surgical History  Procedure Laterality Date  . Cholecystectomy    . Breast surgery  06/1994    Reconstruction  . Ectopic pregnancy surgery  10/2008    Tube  remained  . Thyroid US  06/2010    heterogeneous normal sized thyroid gland consistent with chronic thyroiditis   Family History  Problem Relation Age of Onset  . Hypertension Father   . Heart disease Father     Porcine Valve Replacement  . Cancer Maternal Grandmother     Ovarian cancer  . Diabetes Neg Hx   . Stroke Neg Hx   . Cancer Cousin     Thyroid cancer   Social History:  reports that she has never smoked. She has never used smokeless tobacco. She reports that  drinks alcohol. She reports that she does not use illicit drugs. Allergies:  Allergies  Allergen Reactions  . Erythromycin     REACTION: GI upset, rash, SOB  . Penicillins     REACTION: as child, ok with amoxicillin in past    Triggers:Stress  Birth control: condoms  ROS: Positive for migraine, thyroid disease, stress incontinence, depression, anxiety  Exam: Well developed, Obese Caucasian female  General: NAD, rather anxious HEENT: Negative Cardiac: RRR Lungs:Clear Neuro:Negative Skin:Warm and dry  Impression:migraine - common  Plan: Discussed the pathophysiology of migraine. Also the risk of medications in pregnancy and she is advised to not become pregnant.  Will start her on Effexor 37.5mg  x 1 week the up to 2 or 3 tabs as need for anxiety and depression. She will RTC for BP check tomorrow. She is aware if BP is elevated we will reconsider plan.  She will be given Imitrex and anaprox to take for migraine. RTC 6 weeks  Time Spent: 45 min

## 2012-08-06 NOTE — Patient Instructions (Signed)
Migraine Headache A migraine headache is an intense, throbbing pain on one or both sides of your head. A migraine can last for 30 minutes to several hours. CAUSES  The exact cause of a migraine headache is not always known. However, a migraine may be caused when nerves in the brain become irritated and release chemicals that cause inflammation. This causes pain. SYMPTOMS  Pain on one or both sides of your head.  Pulsating or throbbing pain.  Severe pain that prevents daily activities.  Pain that is aggravated by any physical activity.  Nausea, vomiting, or both.  Dizziness.  Pain with exposure to bright lights, loud noises, or activity.  General sensitivity to bright lights, loud noises, or smells. Before you get a migraine, you may get warning signs that a migraine is coming (aura). An aura may include:  Seeing flashing lights.  Seeing bright spots, halos, or zig-zag lines.  Having tunnel vision or blurred vision.  Having feelings of numbness or tingling.  Having trouble talking.  Having muscle weakness. MIGRAINE TRIGGERS  Alcohol.  Smoking.  Stress.  Menstruation.  Aged cheeses.  Foods or drinks that contain nitrates, glutamate, aspartame, or tyramine.  Lack of sleep.  Chocolate.  Caffeine.  Hunger.  Physical exertion.  Fatigue.  Medicines used to treat chest pain (nitroglycerine), birth control pills, estrogen, and some blood pressure medicines. DIAGNOSIS  A migraine headache is often diagnosed based on:  Symptoms.  Physical examination.  A CT scan or MRI of your head. TREATMENT Medicines may be given for pain and nausea. Medicines can also be given to help prevent recurrent migraines.  HOME CARE INSTRUCTIONS  Only take over-the-counter or prescription medicines for pain or discomfort as directed by your caregiver. The use of long-term narcotics is not recommended.  Lie down in a dark, quiet room when you have a migraine.  Keep a journal  to find out what may trigger your migraine headaches. For example, write down:  What you eat and drink.  How much sleep you get.  Any change to your diet or medicines.  Limit alcohol consumption.  Quit smoking if you smoke.  Get 7 to 9 hours of sleep, or as recommended by your caregiver.  Limit stress.  Keep lights dim if bright lights bother you and make your migraines worse. SEEK IMMEDIATE MEDICAL CARE IF:   Your migraine becomes severe.  You have a fever.  You have a stiff neck.  You have vision loss.  You have muscular weakness or loss of muscle control.  You start losing your balance or have trouble walking.  You feel faint or pass out.  You have severe symptoms that are different from your first symptoms. MAKE SURE YOU:   Understand these instructions.  Will watch your condition.  Will get help right away if you are not doing well or get worse. Document Released: 03/06/2005 Document Revised: 05/29/2011 Document Reviewed: 02/24/2011 ExitCare Patient Information 2013 [employer].  

## 2012-08-08 ENCOUNTER — Ambulatory Visit: Payer: BC Managed Care – PPO | Admitting: *Deleted

## 2012-08-08 VITALS — BP 121/78

## 2012-08-08 DIAGNOSIS — R030 Elevated blood-pressure reading, without diagnosis of hypertension: Secondary | ICD-10-CM

## 2012-08-29 ENCOUNTER — Other Ambulatory Visit: Payer: Self-pay | Admitting: Family Medicine

## 2012-09-09 ENCOUNTER — Other Ambulatory Visit: Payer: Self-pay | Admitting: *Deleted

## 2012-09-09 DIAGNOSIS — K219 Gastro-esophageal reflux disease without esophagitis: Secondary | ICD-10-CM

## 2012-09-09 MED ORDER — OMEPRAZOLE 20 MG PO CPDR: DELAYED_RELEASE_CAPSULE | ORAL | Status: DC

## 2012-09-18 ENCOUNTER — Ambulatory Visit: Payer: BC Managed Care – PPO | Admitting: Family Medicine

## 2012-09-24 ENCOUNTER — Ambulatory Visit (INDEPENDENT_AMBULATORY_CARE_PROVIDER_SITE_OTHER): Payer: BC Managed Care – PPO | Admitting: Nurse Practitioner

## 2012-09-24 ENCOUNTER — Encounter: Payer: Self-pay | Admitting: Nurse Practitioner

## 2012-09-24 VITALS — BP 130/92 | HR 61 | Ht 63.0 in | Wt 218.0 lb

## 2012-09-24 DIAGNOSIS — G43009 Migraine without aura, not intractable, without status migrainosus: Secondary | ICD-10-CM

## 2012-09-24 NOTE — Patient Instructions (Addendum)
Migraine Headache A migraine headache is an intense, throbbing pain on one or both sides of your head. A migraine can last for 30 minutes to several hours. CAUSES  The exact cause of a migraine headache is not always known. However, a migraine may be caused when nerves in the brain become irritated and release chemicals that cause inflammation. This causes pain. SYMPTOMS  Pain on one or both sides of your head.  Pulsating or throbbing pain.  Severe pain that prevents daily activities.  Pain that is aggravated by any physical activity.  Nausea, vomiting, or both.  Dizziness.  Pain with exposure to bright lights, loud noises, or activity.  General sensitivity to bright lights, loud noises, or smells. Before you get a migraine, you may get warning signs that a migraine is coming (aura). An aura may include:  Seeing flashing lights.  Seeing bright spots, halos, or zig-zag lines.  Having tunnel vision or blurred vision.  Having feelings of numbness or tingling.  Having trouble talking.  Having muscle weakness. MIGRAINE TRIGGERS  Alcohol.  Smoking.  Stress.  Menstruation.  Aged cheeses.  Foods or drinks that contain nitrates, glutamate, aspartame, or tyramine.  Lack of sleep.  Chocolate.  Caffeine.  Hunger.  Physical exertion.  Fatigue.  Medicines used to treat chest pain (nitroglycerine), birth control pills, estrogen, and some blood pressure medicines. DIAGNOSIS  A migraine headache is often diagnosed based on:  Symptoms.  Physical examination.  A CT scan or MRI of your head. TREATMENT Medicines may be given for pain and nausea. Medicines can also be given to help prevent recurrent migraines.  HOME CARE INSTRUCTIONS  Only take over-the-counter or prescription medicines for pain or discomfort as directed by your caregiver. The use of long-term narcotics is not recommended.  Lie down in a dark, quiet room when you have a migraine.  Keep a journal  to find out what may trigger your migraine headaches. For example, write down:  What you eat and drink.  How much sleep you get.  Any change to your diet or medicines.  Limit alcohol consumption.  Quit smoking if you smoke.  Get 7 to 9 hours of sleep, or as recommended by your caregiver.  Limit stress.  Keep lights dim if bright lights bother you and make your migraines worse. SEEK IMMEDIATE MEDICAL CARE IF:   Your migraine becomes severe.  You have a fever.  You have a stiff neck.  You have vision loss.  You have muscular weakness or loss of muscle control.  You start losing your balance or have trouble walking.  You feel faint or pass out.  You have severe symptoms that are different from your first symptoms. MAKE SURE YOU:   Understand these instructions.  Will watch your condition.  Will get help right away if you are not doing well or get worse. Document Released: 03/06/2005 Document Revised: 05/29/2011 Document Reviewed: 02/24/2011 ExitCare Patient Information 2014 [employer].  

## 2012-09-24 NOTE — Progress Notes (Signed)
S: Pt is in office today to follow up on migraines, anxiety, and BP. She had her BP checked the day after her visit and it was wnl. Today it is slightly elevated, at 130/92. She has been doing a great job of trying to lose weight and exercise and has lost almost 10 lbs and walking 2 miles per day. She has been able to tolerate 75 mg of Effexor only, but it has helped her mood and migraine significantly. She has only had one migraine and it was taken care of by Imitrex. She did have an illness that was diagnosed as hoof and mouth disease and was ill for one week with fever, rash and dehydration. She has recovered from that illness.  O: Alert, oriented, NAD Cardiac: RRR Lungs: clear Neuro: Negative  A: Migraine Obesity  P: We discussed migraines, Effexor, BP, weight loss and created a plan to purchase BP cuff and keep daily BP and report back to me. She was given BP parameters. She will also be set up with [facility] nutritionist and follow guidelines for weight loss. Hopefully these things will help with decreasing her BP. Her migraines are doing great. Her annual exam is due and she will see Dr Marice Potter and on that day report her BP readings.

## 2012-10-29 ENCOUNTER — Encounter: Payer: Self-pay | Admitting: Obstetrics & Gynecology

## 2012-10-29 ENCOUNTER — Ambulatory Visit (INDEPENDENT_AMBULATORY_CARE_PROVIDER_SITE_OTHER): Payer: BC Managed Care – PPO | Admitting: Obstetrics & Gynecology

## 2012-10-29 ENCOUNTER — Encounter: Payer: BC Managed Care – PPO | Admitting: Nurse Practitioner

## 2012-10-29 VITALS — BP 114/82 | HR 61 | Ht 63.0 in | Wt 215.0 lb

## 2012-10-29 DIAGNOSIS — Z0000 Encounter for general adult medical examination without abnormal findings: Secondary | ICD-10-CM

## 2012-10-29 DIAGNOSIS — N912 Amenorrhea, unspecified: Secondary | ICD-10-CM

## 2012-10-29 DIAGNOSIS — Z01419 Encounter for gynecological examination (general) (routine) without abnormal findings: Secondary | ICD-10-CM

## 2012-10-29 DIAGNOSIS — Z1151 Encounter for screening for human papillomavirus (HPV): Secondary | ICD-10-CM

## 2012-10-29 DIAGNOSIS — Z124 Encounter for screening for malignant neoplasm of cervix: Secondary | ICD-10-CM

## 2012-10-29 LAB — LIPID PANEL
HDL: 49 mg/dL (ref >39)
Total CHOL/HDL Ratio: 3.7 Ratio
Triglycerides: 130 mg/dL (ref <150)

## 2012-10-29 LAB — COMPREHENSIVE METABOLIC PANEL
Albumin: 4.5 g/dL (ref 3.5–5.2)
Alkaline Phosphatase: 66 U/L (ref 39–117)
BUN: 18 mg/dL (ref 6–23)
Calcium: 9.6 mg/dL (ref 8.4–10.5)
Chloride: 98 mEq/L (ref 96–112)
Creat: 0.9 mg/dL (ref 0.50–1.10)
Glucose, Bld: 83 mg/dL (ref 70–99)
Potassium: 4.5 mEq/L (ref 3.5–5.3)
Sodium: 135 mEq/L (ref 135–145)

## 2012-10-29 LAB — CBC
MCH: 25.3 pg — ABNORMAL LOW (ref 26.0–34.0)
Platelets: 293 10*3/uL (ref 150–400)
RBC: 5.13 MIL/uL — ABNORMAL HIGH (ref 3.87–5.11)
RDW: 15.4 % (ref 11.5–15.5)
WBC: 11.2 10*3/uL — ABNORMAL HIGH (ref 4.0–10.5)

## 2012-10-29 NOTE — Addendum Note (Signed)
Addended by: Barbara Cower on: 10/29/2012 10:42 AM   Modules accepted: Orders

## 2012-10-29 NOTE — Progress Notes (Signed)
Subjective:    Tamara Shaffer is a 37 y.o. female who presents for an annual exam. The patient has no complaints today. She will get fasting labs today.  The patient is sexually active. GYN screening history: last pap: was normal. The patient wears seatbelts: yes. The patient participates in regular exercise: yes. (ellipitical, jogging)  Has the patient ever been transfused or tattooed?: no. The patient reports that there is not domestic violence in her life.   Menstrual History: OB History   Grav Para Term Preterm Abortions TAB SAB Ect Mult Living   2 2        2       Menarche age: 13 [place]: 35   Patient's last menstrual period was 09/18/2012.    The following portions of the patient's history were reviewed and updated as appropriate: allergies, current medications, past family history, past medical history, past social history, past surgical history and problem list.  Review of Systems A comprehensive review of systems was negative. Only 1 sexual partner in lifetime. 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 Uses condoms for contraception.   Objective:    BP 114/82  Pulse 61  Ht 5\' 3"  (1.6 m)  Wt 215 lb (97.523 kg)  BMI 38.09 kg/m2  LMP 09/18/2012  General Appearance:    Alert, cooperative, no distress, appears stated age  Head:    Normocephalic, without obvious abnormality, atraumatic  Eyes:    PERRL, conjunctiva/corneas clear, EOM's intact, fundi    benign, both eyes  Ears:    Normal TM's and external ear canals, both ears  Nose:   Nares normal, septum midline, mucosa normal, no drainage    or sinus tenderness  Throat:   Lips, mucosa, and tongue normal; teeth and gums normal  Neck:   Supple, symmetrical, trachea midline, no adenopathy;    thyroid:  no enlargement/tenderness/nodules; no carotid   bruit or JVD  Back:     Symmetric, no curvature, ROM normal, no CVA tenderness  Lungs:     Clear to auscultation bilaterally, respirations  unlabored  Chest Wall:    No tenderness or deformity   Heart:    Regular rate and rhythm, S1 and S2 normal, no murmur, rub   or gallop  Breast Exam:    No tenderness, masses, or nipple abnormality  Abdomen:     Soft, non-tender, bowel sounds active all four quadrants,    no masses, no organomegaly  Genitalia:    Normal female without lesion, discharge or tenderness, ULN AV, mobile, NT, normal adnexal exam     Extremities:   Extremities normal, atraumatic, no cyanosis or edema  Pulses:   2+ and symmetric all extremities  Skin:   Skin color, texture, turgor normal, no rashes or lesions  Lymph nodes:   Cervical, supraclavicular, and axillary nodes normal  Neurologic:   CNII-XII intact, normal strength, sensation and reflexes    throughout  .    Assessment:    Healthy female exam.    Plan:     Thin prep Pap smear. with cotesting Fasting labs

## 2012-11-05 ENCOUNTER — Encounter: Payer: BC Managed Care – PPO | Admitting: Nurse Practitioner

## 2012-11-14 ENCOUNTER — Telehealth: Payer: Self-pay | Admitting: *Deleted

## 2012-11-14 DIAGNOSIS — R12 Heartburn: Secondary | ICD-10-CM

## 2012-11-14 MED ORDER — LANSOPRAZOLE 30 MG PO CPDR: 30.0000 mg | DELAYED_RELEASE_CAPSULE | Freq: Every day | ORAL | Status: DC

## 2012-11-14 NOTE — Telephone Encounter (Signed)
Patient needs rx changed for insurance reasons for heart burn meds.

## 2012-12-10 ENCOUNTER — Ambulatory Visit (INDEPENDENT_AMBULATORY_CARE_PROVIDER_SITE_OTHER): Payer: BC Managed Care – PPO | Admitting: Nurse Practitioner

## 2012-12-10 ENCOUNTER — Encounter: Payer: Self-pay | Admitting: Nurse Practitioner

## 2012-12-10 VITALS — BP 116/87 | HR 62 | Ht 63.0 in | Wt 214.0 lb

## 2012-12-10 DIAGNOSIS — G43009 Migraine without aura, not intractable, without status migrainosus: Secondary | ICD-10-CM

## 2012-12-10 DIAGNOSIS — R030 Elevated blood-pressure reading, without diagnosis of hypertension: Secondary | ICD-10-CM

## 2012-12-10 DIAGNOSIS — Z800 Family history of malignant neoplasm of digestive organs: Secondary | ICD-10-CM

## 2012-12-10 DIAGNOSIS — J069 Acute upper respiratory infection, unspecified: Secondary | ICD-10-CM

## 2012-12-10 DIAGNOSIS — IMO0001 Reserved for inherently not codable concepts without codable children: Secondary | ICD-10-CM | POA: Insufficient documentation

## 2012-12-10 MED ORDER — AZITHROMYCIN 250 MG PO TABS: ORAL_TABLET | ORAL | Status: DC

## 2012-12-10 MED ORDER — HYDROCHLOROTHIAZIDE 25 MG PO TABS: 25.0000 mg | ORAL_TABLET | Freq: Every day | ORAL | Status: DC

## 2012-12-10 MED ORDER — VENLAFAXINE HCL ER 150 MG PO CP24: 150.0000 mg | ORAL_CAPSULE | Freq: Every day | ORAL | Status: DC

## 2012-12-10 NOTE — Patient Instructions (Signed)
Migraine Headache A migraine headache is an intense, throbbing pain on one or both sides of your head. A migraine can last for 30 minutes to several hours. CAUSES  The exact cause of a migraine headache is not always known. However, a migraine may be caused when nerves in the brain become irritated and release chemicals that cause inflammation. This causes pain. SYMPTOMS  Pain on one or both sides of your head.  Pulsating or throbbing pain.  Severe pain that prevents daily activities.  Pain that is aggravated by any physical activity.  Nausea, vomiting, or both.  Dizziness.  Pain with exposure to bright lights, loud noises, or activity.  General sensitivity to bright lights, loud noises, or smells. Before you get a migraine, you may get warning signs that a migraine is coming (aura). An aura may include:  Seeing flashing lights.  Seeing bright spots, halos, or zig-zag lines.  Having tunnel vision or blurred vision.  Having feelings of numbness or tingling.  Having trouble talking.  Having muscle weakness. MIGRAINE TRIGGERS  Alcohol.  Smoking.  Stress.  Menstruation.  Aged cheeses.  Foods or drinks that contain nitrates, glutamate, aspartame, or tyramine.  Lack of sleep.  Chocolate.  Caffeine.  Hunger.  Physical exertion.  Fatigue.  Medicines used to treat chest pain (nitroglycerine), birth control pills, estrogen, and some blood pressure medicines. DIAGNOSIS  A migraine headache is often diagnosed based on:  Symptoms.  Physical examination.  A CT scan or MRI of your head. TREATMENT Medicines may be given for pain and nausea. Medicines can also be given to help prevent recurrent migraines.  HOME CARE INSTRUCTIONS  Only take over-the-counter or prescription medicines for pain or discomfort as directed by your caregiver. The use of long-term narcotics is not recommended.  Lie down in a dark, quiet room when you have a migraine.  Keep a journal  to find out what may trigger your migraine headaches. For example, write down:  What you eat and drink.  How much sleep you get.  Any change to your diet or medicines.  Limit alcohol consumption.  Quit smoking if you smoke.  Get 7 to 9 hours of sleep, or as recommended by your caregiver.  Limit stress.  Keep lights dim if bright lights bother you and make your migraines worse. SEEK IMMEDIATE MEDICAL CARE IF:   Your migraine becomes severe.  You have a fever.  You have a stiff neck.  You have vision loss.  You have muscular weakness or loss of muscle control.  You start losing your balance or have trouble walking.  You feel faint or pass out.  You have severe symptoms that are different from your first symptoms. MAKE SURE YOU:   Understand these instructions.  Will watch your condition.  Will get help right away if you are not doing well or get worse. Document Released: 03/06/2005 Document Revised: 05/29/2011 Document Reviewed: 02/24/2011 ExitCare Patient Information 2014 [employer].  

## 2012-12-10 NOTE — Progress Notes (Signed)
S: Tamara Shaffer comes to [facility] for follow up with migraine headaches, anxiety, depression, sx of upper respiratory infection and charted blood pressures. Her migraines, anxiety and depression are worse due to stress of school. She was trying to change schools this summer and was unable to accomplish that. She is back in a tough working situation and that is causing her stress. She has been watching her blood pressures and they are ranging from 130-140/ 82-95. She did not ever get a call from Nutritionist. Her weight is down one lb since her physical. She complains of URI that has been ongoing for 2 weeks and she has taken sudafed, musonex, and nyquil with no relief. She has been blowing out green mucus and has been running low grade fever of 100. Her last issue is of needing more medications for her reflux. She has been on some type of medications for years without any workup.  O: Alert, oriented, NAD with nasal congestion HEENT: pharyn is red, cervical lymph nodes swollen Cardiac RRR Lungs Clear  A: Migraine worse Anxiety Depression URI Elevated Blood Pressures GERD  P: Increase Effexor to 150 mg daily Add HCTZ 25 mg q am and continue to monitor daily BPs Z Pack for URI GI referral for management of GERD

## 2012-12-11 ENCOUNTER — Encounter: Payer: Self-pay | Admitting: Internal Medicine

## 2012-12-11 NOTE — Addendum Note (Signed)
Addended by: Barbara Cower on: 12/11/2012 03:07 PM   Modules accepted: Orders

## 2013-01-13 ENCOUNTER — Ambulatory Visit (INDEPENDENT_AMBULATORY_CARE_PROVIDER_SITE_OTHER): Payer: BC Managed Care – PPO

## 2013-01-13 DIAGNOSIS — Z23 Encounter for immunization: Secondary | ICD-10-CM

## 2013-01-16 ENCOUNTER — Ambulatory Visit: Payer: BC Managed Care – PPO | Admitting: Internal Medicine

## 2013-03-06 ENCOUNTER — Other Ambulatory Visit: Payer: Self-pay | Admitting: Family Medicine

## 2013-03-11 ENCOUNTER — Ambulatory Visit (INDEPENDENT_AMBULATORY_CARE_PROVIDER_SITE_OTHER): Payer: BC Managed Care – PPO | Admitting: Nurse Practitioner

## 2013-03-11 ENCOUNTER — Encounter: Payer: Self-pay | Admitting: Nurse Practitioner

## 2013-03-11 VITALS — BP 121/83 | HR 62 | Ht 63.0 in | Wt 212.0 lb

## 2013-03-11 DIAGNOSIS — G43009 Migraine without aura, not intractable, without status migrainosus: Secondary | ICD-10-CM

## 2013-03-11 DIAGNOSIS — R030 Elevated blood-pressure reading, without diagnosis of hypertension: Secondary | ICD-10-CM

## 2013-03-11 DIAGNOSIS — IMO0001 Reserved for inherently not codable concepts without codable children: Secondary | ICD-10-CM

## 2013-03-11 NOTE — Progress Notes (Signed)
History:  Tamara E Kieltyis a 37 y.o. G2P2 who presents to [facility] clinic today for migraine follow up. She was last seen 12/10/12. Since then she has gone up on her effexor and done better with migraine, anxiety and depression. She in fact has had no migraines. Her stress is better. She has not seen GI or Nutrition, but plans to do both. She is doing well on HCTZ. Her weight is down one pound only, but she will get back on that after new Year  The following portions of the patient's history were reviewed and updated as appropriate: allergies, current medications, past family history, past medical history, past social history, past surgical history and problem list.  Review of Systems:  Pertinent items are noted in HPI.  Objective:  Physical Exam BP 121/83  Pulse 62  Ht 5\' 3"  (1.6 m)  Wt 212 lb (96.163 kg)  BMI 37.56 kg/m2  LMP 03/04/2013 GENERAL: Well-developed, well-nourished female in no acute distress.  HEENT: Normocephalic, atraumatic.  NECK: Supple. Normal thyroid.  LUNGS: Normal rate. Clear to auscultation bilaterally.  HEART: Regular rate and rhythm with no adventitious sounds.  EXTREMITIES: No cyanosis, clubbing, or edema, 2+ distal pulses.   Labs and Imaging No results found.  Assessment & Plan:  Assessment:  Migraine Anxiety depression  Plans: Continue Medications as directed  Jannifer Rodney, NP   03/11/2013 1:14 PM

## 2013-03-11 NOTE — Patient Instructions (Signed)
Migraine Headache A migraine headache is an intense, throbbing pain on one or both sides of your head. A migraine can last for 30 minutes to several hours. CAUSES  The exact cause of a migraine headache is not always known. However, a migraine may be caused when nerves in the brain become irritated and release chemicals that cause inflammation. This causes pain. SYMPTOMS  Pain on one or both sides of your head.  Pulsating or throbbing pain.  Severe pain that prevents daily activities.  Pain that is aggravated by any physical activity.  Nausea, vomiting, or both.  Dizziness.  Pain with exposure to bright lights, loud noises, or activity.  General sensitivity to bright lights, loud noises, or smells. Before you get a migraine, you may get warning signs that a migraine is coming (aura). An aura may include:  Seeing flashing lights.  Seeing bright spots, halos, or zig-zag lines.  Having tunnel vision or blurred vision.  Having feelings of numbness or tingling.  Having trouble talking.  Having muscle weakness. MIGRAINE TRIGGERS  Alcohol.  Smoking.  Stress.  Menstruation.  Aged cheeses.  Foods or drinks that contain nitrates, glutamate, aspartame, or tyramine.  Lack of sleep.  Chocolate.  Caffeine.  Hunger.  Physical exertion.  Fatigue.  Medicines used to treat chest pain (nitroglycerine), birth control pills, estrogen, and some blood pressure medicines. DIAGNOSIS  A migraine headache is often diagnosed based on:  Symptoms.  Physical examination.  A CT scan or MRI of your head. TREATMENT Medicines may be given for pain and nausea. Medicines can also be given to help prevent recurrent migraines.  HOME CARE INSTRUCTIONS  Only take over-the-counter or prescription medicines for pain or discomfort as directed by your caregiver. The use of long-term narcotics is not recommended.  Lie down in a dark, quiet room when you have a migraine.  Keep a journal  to find out what may trigger your migraine headaches. For example, write down:  What you eat and drink.  How much sleep you get.  Any change to your diet or medicines.  Limit alcohol consumption.  Quit smoking if you smoke.  Get 7 to 9 hours of sleep, or as recommended by your caregiver.  Limit stress.  Keep lights dim if bright lights bother you and make your migraines worse. SEEK IMMEDIATE MEDICAL CARE IF:   Your migraine becomes severe.  You have a fever.  You have a stiff neck.  You have vision loss.  You have muscular weakness or loss of muscle control.  You start losing your balance or have trouble walking.  You feel faint or pass out.  You have severe symptoms that are different from your first symptoms. MAKE SURE YOU:   Understand these instructions.  Will watch your condition.  Will get help right away if you are not doing well or get worse. Document Released: 03/06/2005 Document Revised: 05/29/2011 Document Reviewed: 02/24/2011 ExitCare Patient Information 2014 [employer].  

## 2013-04-11 ENCOUNTER — Encounter: Payer: Self-pay | Admitting: Family Medicine

## 2013-04-11 ENCOUNTER — Ambulatory Visit (INDEPENDENT_AMBULATORY_CARE_PROVIDER_SITE_OTHER): Payer: BC Managed Care – PPO | Admitting: Family Medicine

## 2013-04-11 VITALS — BP 118/82 | HR 72 | Temp 98.6°F | Wt 215.8 lb

## 2013-04-11 DIAGNOSIS — R21 Rash and other nonspecific skin eruption: Secondary | ICD-10-CM | POA: Insufficient documentation

## 2013-04-11 DIAGNOSIS — J019 Acute sinusitis, unspecified: Secondary | ICD-10-CM

## 2013-04-11 MED ORDER — DOXYCYCLINE HYCLATE 100 MG PO CAPS: 100.0000 mg | ORAL_CAPSULE | Freq: Two times a day (BID) | ORAL | Status: DC

## 2013-04-11 MED ORDER — HYDROCOD POLST-CHLORPHEN POLST 10-8 MG/5ML PO LQCR: 5.0000 mL | Freq: Every evening | ORAL | Status: DC | PRN

## 2013-04-11 NOTE — Progress Notes (Signed)
   Subjective:    Patient ID: Tamara Shaffer, female    DOB: 02/05/1976, 38 y.o.   MRN: [id number]  HPI CC: sinusitis?    About 1 mo h/o head congestion.  This is triggering migraines which is pt's main concern.  Prior to congestion migraines had been very well controlled.  Coughing up thick mucous mainly at night time.  + facial pain, toot pain, R ear pain.   Blowing nose productive of clear to thick yellow mucous.  Has been taking OTC remedies including mucinex. No abd pain, fevers.  No dyspnea or wheezing with cough.  Children at school sick recently. Pt did receive flu shot. No asthma history.  + allergic rhinitis.  Past Medical History  Diagnosis Date  . GDM (gestational diabetes mellitus)   . Hypothyroidism     medically treated  . 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 Insomnia   . GERD (gastroesophageal reflux disease)   . Allergic rhinitis     seasonal  . Prediabetes     A1c 5.8% 11/2010  . Vulvar burning   . Urinary incontinence   . Obesity      Review of Systems Per HPI    Objective:   Physical Exam  Vitals reviewed. Constitutional: She appears well-developed and well-nourished. No distress.  HENT:  Head: Normocephalic and atraumatic.  Right Ear: Hearing, tympanic membrane, external ear and ear canal normal.  Left Ear: Hearing, tympanic membrane, external ear and ear canal normal.  Nose: Mucosal edema present. No rhinorrhea. Right sinus exhibits no maxillary sinus tenderness and no frontal sinus tenderness. Left sinus exhibits no maxillary sinus tenderness and no frontal sinus tenderness.  Mouth/Throat: Uvula is midline, oropharynx is clear and moist and mucous membranes are normal. No oropharyngeal exudate, posterior oropharyngeal edema, posterior oropharyngeal erythema or tonsillar abscesses.  nasal mucosal irritation L>R  Eyes: Conjunctivae and EOM are normal. Pupils are equal, round, and reactive to  light. No scleral icterus.  Neck: Normal range of motion. Neck supple.  Cardiovascular: Normal rate, regular rhythm, normal heart sounds and intact distal pulses.   No murmur heard. Pulmonary/Chest: Effort normal and breath sounds normal. No respiratory distress. She has no wheezes. She has no rales.  Musculoskeletal: She exhibits no edema.  Lymphadenopathy:    She has no cervical adenopathy.  Skin: Skin is warm and dry. No rash noted.  Bilateral extensor surfaces of elbows with small papules present - preserved skin lines       Assessment & Plan:

## 2013-04-11 NOTE — Progress Notes (Signed)
Pre-visit discussion using our clinic review tool. No additional management support is needed unless otherwise documented below in the visit note.  

## 2013-04-11 NOTE — Assessment & Plan Note (Signed)
Anticipate molluscum contagiosum, discussed benign nature of condition.

## 2013-04-11 NOTE — Assessment & Plan Note (Signed)
Given duration and progression of sxs - will treat with doxycycline course. See pt instructions. tussionex for cough at night time.

## 2013-04-11 NOTE — Patient Instructions (Signed)
You have a sinus infection. Take medicine as prescribed: doxycycline 10 day course and use tussionex for cough at night time as needed. Push fluids and plenty of rest. Nasal saline irrigation or neti pot to help drain sinuses. May use plain mucinex with plenty of fluid to help mobilize mucous. Let us know if fever >101.5, trouble opening/closing mouth, difficulty swallowing, or worsening - you may need to be seen again.

## 2013-04-22 ENCOUNTER — Telehealth: Payer: Self-pay | Admitting: *Deleted

## 2013-04-22 NOTE — Telephone Encounter (Signed)
Pharmacy has sent a request for a prior approval for Prilosec.  It has been faxed.

## 2013-04-25 ENCOUNTER — Telehealth: Payer: Self-pay | Admitting: *Deleted

## 2013-04-25 NOTE — Telephone Encounter (Signed)
Patient authorization for Omeprazole was authorized for one year  Patient was notified and she will pick up rx.

## 2013-04-28 ENCOUNTER — Telehealth: Payer: Self-pay

## 2013-04-28 NOTE — Telephone Encounter (Signed)
Pt left v/m pt had recent sinus infection; pt having itchy watery eyes, facial pressure and sore to touch, head congestion, ears ache. Pt is not sure if has fever or not. Pt scheduled appt 04/29/13 with Nicki Reaperegina Baity NP.

## 2013-04-29 ENCOUNTER — Ambulatory Visit (INDEPENDENT_AMBULATORY_CARE_PROVIDER_SITE_OTHER): Payer: BC Managed Care – PPO | Admitting: Internal Medicine

## 2013-04-29 ENCOUNTER — Encounter: Payer: Self-pay | Admitting: Internal Medicine

## 2013-04-29 VITALS — BP 126/82 | HR 65 | Temp 98.6°F | Wt 214.8 lb

## 2013-04-29 DIAGNOSIS — J019 Acute sinusitis, unspecified: Secondary | ICD-10-CM

## 2013-04-29 MED ORDER — AMOXICILLIN-POT CLAVULANATE 875-125 MG PO TABS: 1.0000 | ORAL_TABLET | Freq: Two times a day (BID) | ORAL | Status: DC

## 2013-04-29 MED ORDER — FLUTICASONE PROPIONATE 50 MCG/ACT NA SUSP: 2.0000 | Freq: Every day | NASAL | Status: DC

## 2013-04-29 NOTE — Progress Notes (Signed)
Pre-visit discussion using our clinic review tool. No additional management support is needed unless otherwise documented below in the visit note.  

## 2013-04-29 NOTE — Progress Notes (Signed)
HPI: Pt presents to the office today with concerns of recurrent sinus infection. She endorses facial pain, headache, nasal congestion, cough, and ear pressure. She denies fever, chills, sore throat, or difficulty breathing. She was on a course of Doxycycline which she finished three  days ago.     Review of Systems    Past Medical History  Diagnosis Date  . GDM (gestational diabetes mellitus)   . Hypothyroidism     medically treated  . 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 Insomnia   . GERD (gastroesophageal reflux disease)   . Allergic rhinitis     seasonal  . Prediabetes     A1c 5.8% 11/2010  . Vulvar burning   . Urinary incontinence   . Obesity     Family History  Problem Relation Age of Onset  . Hypertension Father   . Heart disease Father     Porcine Valve Replacement  . Cancer Maternal Grandmother     Ovarian cancer  . Diabetes Neg Hx   . Stroke Neg Hx   . Cancer Cousin     Thyroid cancer    History   Social History  . Marital Status: Married    Spouse Name: N/A    Number of Children: 2  . Years of Education: N/A   Occupational History  . Teaches school, 2nd grade    Social History Main Topics  . Smoking status: Never Smoker   . Smokeless tobacco: Never Used  . Alcohol Use: Yes     Comment: Socially  . Drug Use: No  . Sexual Activity: Yes    Partners: Male    Birth Control/ Protection: Condom   Other Topics Concern  . Not on file   Social History Narrative   Caffeine:  1 cup daily    Allergies  Allergen Reactions  . Erythromycin     REACTION: GI upset, rash, SOB  . Penicillins     REACTION: as child, ok with amoxicillin in past     Constitutional: Positive headache. Denies fatigue and fever. Denies abrupt weight changes.  HEENT:  Positive eye pain, pressure behind the eyes, facial pain, nasal congestion and sore throat. Denies eye redness, ear pain, ringing in the ears, wax buildup,  runny nose or bloody nose. Respiratory: Positive cough. Denies difficulty breathing or shortness of breath.  Cardiovascular: Denies chest pain, chest tightness, palpitations or swelling in the hands or feet.   No other specific complaints in a complete review of systems (except as listed in HPI above).  Objective:    General: Appears his stated age, well developed, well nourished in NAD. HEENT: Head: normal shape and size, sinus tenderness noted; Eyes: sclera white, no icterus, conjunctiva pink, PERRLA and EOMs intact; Ears: Tm's gray and intact, normal light reflex; Nose: mucosa pink and moist, edematous, septum midline; Bilateral maxillary tenderness.  Throat/Mouth: + PND. Teeth present, mucosa pink and moist, no exudate noted, no lesions or ulcerations noted.  Neck: Neck supple, trachea midline. No massses, lumps or thyromegaly present.  Cardiovascular: Normal rate and rhythm. S1,S2 noted.  No murmur, rubs or gallops noted. No JVD or BLE edema. No carotid bruits noted. Pulmonary/Chest: Normal effort and positive vesicular breath sounds. No respiratory distress. No wheezes, rales or ronchi noted.      Assessment & Plan:   Acute bacterial sinusitis  Can use a Neti Pot which can be purchased from your local drug store. Flonase  2 sprays each nostril for 3 days and then as needed. Augmentin BID for 10 days  RTC as needed or if symptoms persist.  Whitaker Holderman, Jacques Earthlyourtney S, Student-NP

## 2013-04-29 NOTE — Progress Notes (Addendum)
HPI  Pt presents to the clinic today with c/o ear pain, fullness, facial pressure, nasal congestion and sore throat. She reports this started 3 weeks ago. She is blowing thick green mucous out of her nose. She saw Dr. Reece Agar for the same on 04/11/13. He put her on Doxycycline. She has finished all the prescribed antibiotics. She has not notice any improvement in her symptoms.  Review of Systems    Past Medical History  Diagnosis Date  . GDM (gestational diabetes mellitus)   . Hypothyroidism     medically treated  . 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 Insomnia   . GERD (gastroesophageal reflux disease)   . Allergic rhinitis     seasonal  . Prediabetes     A1c 5.8% 11/2010  . Vulvar burning   . Urinary incontinence   . Obesity     Family History  Problem Relation Age of Onset  . Hypertension Father   . Heart disease Father     Porcine Valve Replacement  . Cancer Maternal Grandmother     Ovarian cancer  . Diabetes Neg Hx   . Stroke Neg Hx   . Cancer Cousin     Thyroid cancer    History   Social History  . Marital Status: Married    Spouse Name: N/A    Number of Children: 2  . Years of Education: N/A   Occupational History  . Teaches school, 2nd grade    Social History Main Topics  . Smoking status: Never Smoker   . Smokeless tobacco: Never Used  . Alcohol Use: Yes     Comment: Socially  . Drug Use: No  . Sexual Activity: Yes    Partners: Male    Birth Control/ Protection: Condom   Other Topics Concern  . Not on file   Social History Narrative   Caffeine:  1 cup daily    Allergies  Allergen Reactions  . Erythromycin     REACTION: GI upset, rash, SOB  . Penicillins     REACTION: as child, ok with amoxicillin in past     Constitutional: Positive headache, fatigue and fever. Denies abrupt weight changes.  HEENT:  Positive eye pain, pressure behind the eyes, facial pain, nasal congestion and sore  throat. Denies eye redness, ear pain, ringing in the ears, wax buildup, runny nose or bloody nose. Respiratory: Positive cough. Denies difficulty breathing or shortness of breath.  Cardiovascular: Denies chest pain, chest tightness, palpitations or swelling in the hands or feet.   No other specific complaints in a complete review of systems (except as listed in HPI above).  Objective:   BP 126/82  Pulse 65  Temp(Src) 98.6 F (37 C) (Oral)  Wt 214 lb 12 oz (97.41 kg)  SpO2 98%  LMP 03/31/2013  General: Appears her stated age, well developed, well nourished in NAD. HEENT: Head: normal shape and size, sinus tenderness noted; Eyes: sclera white, no icterus, conjunctiva pink, PERRLA and EOMs intact; Ears: Tm's gray and intact, normal light reflex; Nose: mucosa pink and moist, septum midline; Throat/Mouth: + PND. Teeth present, mucosa pink and moist, no exudate noted, no lesions or ulcerations noted.  Neck: Neck supple, trachea midline. No massses, lumps or thyromegaly present.  Cardiovascular: Normal rate and rhythm. S1,S2 noted.  No murmur, rubs or gallops noted. No JVD or BLE edema. No carotid bruits noted. Pulmonary/Chest: Normal effort and positive vesicular breath sounds. No  respiratory distress. No wheezes, rales or ronchi noted.      Assessment & Plan:   Acute bacterial sinusitis  Can use a Neti Pot which can be purchased from your local drug store. Flonase 2 sprays each nostril for 3 days and then as needed. Augmentin BID for 10 days  RTC as needed or if symptoms persist.

## 2013-04-29 NOTE — Patient Instructions (Signed)

## 2013-05-01 ENCOUNTER — Telehealth: Payer: Self-pay

## 2013-05-01 NOTE — Telephone Encounter (Signed)
Patient called has a yeast infection, called in some Dyflucan 150 mg tab to her pharmacy [facility]. She has been on antibiotics and developed a yeast.

## 2013-06-06 ENCOUNTER — Encounter: Payer: Self-pay | Admitting: Internal Medicine

## 2013-06-06 ENCOUNTER — Ambulatory Visit (INDEPENDENT_AMBULATORY_CARE_PROVIDER_SITE_OTHER): Payer: BC Managed Care – PPO | Admitting: Internal Medicine

## 2013-06-06 VITALS — BP 140/90 | HR 97 | Temp 97.8°F | Wt 219.0 lb

## 2013-06-06 DIAGNOSIS — R079 Chest pain, unspecified: Secondary | ICD-10-CM | POA: Insufficient documentation

## 2013-06-06 LAB — CBC WITH DIFFERENTIAL/PLATELET
BASOS PCT: 0 % (ref 0–1)
Basophils Absolute: 0 10*3/uL (ref 0.0–0.1)
EOS ABS: 0.4 10*3/uL (ref 0.0–0.7)
EOS PCT: 3 % (ref 0–5)
HEMATOCRIT: 35.8 % — AB (ref 36.0–46.0)
Hemoglobin: 12.1 g/dL (ref 12.0–15.0)
Lymphocytes Relative: 28 % (ref 12–46)
Lymphs Abs: 3.6 10*3/uL (ref 0.7–4.0)
MCH: 26.4 pg (ref 26.0–34.0)
MCHC: 33.8 g/dL (ref 30.0–36.0)
MCV: 78.2 fL (ref 78.0–100.0)
MONO ABS: 0.5 10*3/uL (ref 0.1–1.0)
Monocytes Relative: 4 % (ref 3–12)
NEUTROS ABS: 8.3 10*3/uL — AB (ref 1.7–7.7)
Neutrophils Relative %: 65 % (ref 43–77)
Platelets: 286 10*3/uL (ref 150–400)
RBC: 4.58 MIL/uL (ref 3.87–5.11)
RDW: 14.5 % (ref 11.5–15.5)
WBC: 12.8 10*3/uL — ABNORMAL HIGH (ref 4.0–10.5)

## 2013-06-06 LAB — COMPREHENSIVE METABOLIC PANEL
ALK PHOS: 66 U/L (ref 39–117)
ALT: 18 U/L (ref 0–35)
AST: 14 U/L (ref 0–37)
Albumin: 4 g/dL (ref 3.5–5.2)
BILIRUBIN TOTAL: 0.2 mg/dL (ref 0.2–1.2)
BUN: 19 mg/dL (ref 6–23)
CO2: 29 mEq/L (ref 19–32)
CREATININE: 0.81 mg/dL (ref 0.50–1.10)
Calcium: 9.3 mg/dL (ref 8.4–10.5)
Chloride: 100 mEq/L (ref 96–112)
Glucose, Bld: 92 mg/dL (ref 70–99)
Potassium: 3.8 mEq/L (ref 3.5–5.3)
SODIUM: 137 meq/L (ref 135–145)
TOTAL PROTEIN: 7.6 g/dL (ref 6.0–8.3)

## 2013-06-06 LAB — LIPID PANEL
Cholesterol: 172 mg/dL (ref 0–200)
HDL: 43 mg/dL (ref >39)
LDL CALC: 67 mg/dL (ref 0–99)
Total CHOL/HDL Ratio: 4 Ratio
Triglycerides: 308 mg/dL — ABNORMAL HIGH (ref <150)
VLDL: 62 mg/dL — ABNORMAL HIGH (ref 0–40)

## 2013-06-06 NOTE — Progress Notes (Signed)
Pre visit review using our clinic review tool, if applicable. No additional management support is needed unless otherwise documented below in the visit note. 

## 2013-06-06 NOTE — Addendum Note (Signed)
Addended by: Alvina ChouWALSH, TERRI J on: 06/06/2013 05:06 PM   Modules accepted: Orders

## 2013-06-06 NOTE — Progress Notes (Signed)
Subjective:    Patient ID: Tamara Shaffer, female    DOB: 06/21/1975, 38 y.o.   MRN: [id number]  HPI Tamara FlesherWent to school today and felt normal  2 new students in past week Both are difficult Had spell when trying to correct one of them--- got chest pain on left side. Squeezing sensation, tingling behind left breast 4 separate spells with each anger/correction episode No dizziness Not SOB--but pain kept her from speaking No longer than 10 seconds No nausea but did feel sweaty (but does get hot flashes)  Has had some tightness (much milder)in past  No problems eating Heartburn controlled No swallowing problems  Current Outpatient Prescriptions on File Prior to Visit  Medication Sig Dispense Refill  . aspirin 81 MG tablet Take 81 mg by mouth daily.      . fluticasone (FLONASE) 50 MCG/ACT nasal spray Place 2 sprays into both nostrils daily.  16 g  6  . hydrochlorothiazide (HYDRODIURIL) 25 MG tablet Take 1 tablet (25 mg total) by mouth daily.  30 tablet  6  . levothyroxine (SYNTHROID) 125 MCG tablet Take 1 tablet (125 mcg total) by mouth daily.  90 tablet  3  . Multiple Vitamin (MULTIVITAMIN) tablet Take 1 tablet by mouth daily.        . naproxen sodium (ANAPROX DS) 550 MG tablet Take 1 tablet (550 mg total) by mouth 2 (two) times daily with a meal.  60 tablet  1  . omeprazole (PRILOSEC) 20 MG capsule TAKE 1 CAPSULE (20 MG TOTAL) BY MOUTH 2 (TWO) TIMES DAILY.  180 capsule  3  . SUMAtriptan (IMITREX) 100 MG tablet Take 1 tablet (100 mg total) by mouth once as needed for migraine.  9 tablet  11  . venlafaxine XR (EFFEXOR-XR) 150 MG 24 hr capsule Take 1 capsule (150 mg total) by mouth daily.  30 capsule  6   No current facility-administered medications on file prior to visit.    Allergies  Allergen Reactions  . Erythromycin     REACTION: GI upset, rash, SOB  . Penicillins     REACTION: as child, ok with amoxicillin in past    Past Medical History  Diagnosis Date  . GDM  (gestational diabetes mellitus)   . Hypothyroidism     medically treated  . 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 Insomnia   . GERD (gastroesophageal reflux disease)   . Allergic rhinitis     seasonal  . Prediabetes     A1c 5.8% 11/2010  . Vulvar burning   . Urinary incontinence   . Obesity     Past Surgical History  Procedure Laterality Date  . Cholecystectomy    . Breast surgery  06/1994    Reconstruction  . Ectopic pregnancy surgery  10/2008    Tube remained  . Thyroid us  06/2010    heterogeneous normal sized thyroid gland consistent with chronic thyroiditis    Family History  Problem Relation Age of Onset  . Hypertension Father   . Heart disease Father     Porcine Valve Replacement  . Cancer Maternal Grandmother     Ovarian cancer  . Diabetes Neg Hx   . Stroke Neg Hx   . Cancer Cousin     Thyroid cancer    History   Social History  . Marital Status: Married    Spouse Name: N/A    Number of Children: 2  .  Years of Education: N/A   Occupational History  . Teaches school, 2nd grade    Social History Main Topics  . Smoking status: Never Smoker   . Smokeless tobacco: Never Used  . Alcohol Use: Yes     Comment: Socially  . Drug Use: No  . Sexual Activity: Yes    Partners: Male    Birth Control/ Protection: Condom   Other Topics Concern  . Not on file   Social History Narrative   Caffeine:  1 cup daily   Review of Systems On effexor for anxiety. BP borderline at times (migraine specialist)    Objective:   Physical Exam  Constitutional: She appears well-developed and well-nourished. No distress.  Neck: Normal range of motion. Neck supple. No thyromegaly present.  Cardiovascular: Normal rate, regular rhythm, normal heart sounds and intact distal pulses.  Exam reveals no gallop and no friction rub.   No murmur heard. Pulmonary/Chest: Effort normal and breath sounds normal. No respiratory distress.  She has no wheezes. She has no rales. She exhibits no tenderness.  Abdominal: Soft. There is tenderness.  Musculoskeletal: She exhibits no edema and no tenderness.  Lymphadenopathy:    She has no cervical adenopathy.  Psychiatric: She has a normal mood and affect. Her behavior is normal.          Assessment & Plan:

## 2013-06-06 NOTE — Assessment & Plan Note (Addendum)
Clearly not ischemic---brief sharp and stress related Not GERD Gallbladder out No clear physiologic explanation but not something worrisome Reassured May want to augment her anxiety rx Will just check labs

## 2013-06-07 LAB — T4, FREE: Free T4: 0.96 ng/dL (ref 0.80–1.80)

## 2013-06-07 LAB — TSH: TSH: 1.369 u[IU]/mL (ref 0.350–4.500)

## 2013-06-09 ENCOUNTER — Encounter: Payer: Self-pay | Admitting: *Deleted

## 2013-07-04 ENCOUNTER — Other Ambulatory Visit: Payer: Self-pay | Admitting: Family Medicine

## 2013-07-14 ENCOUNTER — Telehealth: Payer: Self-pay | Admitting: *Deleted

## 2013-07-14 DIAGNOSIS — G43909 Migraine, unspecified, not intractable, without status migrainosus: Secondary | ICD-10-CM

## 2013-07-14 DIAGNOSIS — R030 Elevated blood-pressure reading, without diagnosis of hypertension: Principal | ICD-10-CM

## 2013-07-14 DIAGNOSIS — IMO0001 Reserved for inherently not codable concepts without codable children: Secondary | ICD-10-CM

## 2013-07-14 MED ORDER — HYDROCHLOROTHIAZIDE 25 MG PO TABS: 25.0000 mg | ORAL_TABLET | Freq: Every day | ORAL | Status: DC

## 2013-07-14 MED ORDER — VENLAFAXINE HCL ER 150 MG PO CP24: 150.0000 mg | ORAL_CAPSULE | Freq: Every day | ORAL | Status: DC

## 2013-07-14 NOTE — Telephone Encounter (Signed)
Patient needs refill of maintenance medication.

## 2013-08-06 NOTE — Telephone Encounter (Signed)
ERROR NOTE

## 2013-09-11 ENCOUNTER — Other Ambulatory Visit: Payer: Self-pay | Admitting: Obstetrics & Gynecology

## 2013-11-06 ENCOUNTER — Ambulatory Visit (INDEPENDENT_AMBULATORY_CARE_PROVIDER_SITE_OTHER): Payer: BC Managed Care – PPO | Admitting: Obstetrics & Gynecology

## 2013-11-06 VITALS — BP 131/86 | HR 74 | Ht 63.0 in | Wt 216.0 lb

## 2013-11-06 DIAGNOSIS — N393 Stress incontinence (female) (male): Secondary | ICD-10-CM

## 2013-11-06 DIAGNOSIS — Z01419 Encounter for gynecological examination (general) (routine) without abnormal findings: Secondary | ICD-10-CM

## 2013-11-06 DIAGNOSIS — Z1329 Encounter for screening for other suspected endocrine disorder: Secondary | ICD-10-CM

## 2013-11-06 DIAGNOSIS — G43909 Migraine, unspecified, not intractable, without status migrainosus: Secondary | ICD-10-CM

## 2013-11-06 DIAGNOSIS — Z0000 Encounter for general adult medical examination without abnormal findings: Secondary | ICD-10-CM

## 2013-11-06 MED ORDER — SUMATRIPTAN SUCCINATE 100 MG PO TABS: 100.0000 mg | ORAL_TABLET | Freq: Once | ORAL | Status: DC | PRN

## 2013-11-06 MED ORDER — VENLAFAXINE HCL ER 150 MG PO CP24
150.0000 mg | ORAL_CAPSULE | Freq: Every day | ORAL | Status: DC
Start: 2013-11-06 — End: 2014-02-24

## 2013-11-06 MED ORDER — OMEPRAZOLE 20 MG PO CPDR: DELAYED_RELEASE_CAPSULE | ORAL | Status: DC

## 2013-11-06 NOTE — Progress Notes (Signed)
Subjective:    Tamara Shaffer is a 38 y.o. MW G3P2A1 (7 and 744 yo kids)  female who presents for an annual exam. The patient has no complaints today. She wants fasting labs today. She is using condoms. She still leaks urine with any kind of valsalva. The patient is sexually active. GYN screening history: last pap: was normal. The patient wears seatbelts: yes. The patient participates in regular exercise: yes. Has the patient ever been transfused or tattooed?: no. The patient reports that there is not domestic violence in her life.   Menstrual History: OB History   Grav Para Term Preterm Abortions TAB SAB Ect Mult Living   2 2        2       Menarche age: [age]  No LMP recorded.    The following portions of the patient's history were reviewed and updated as appropriate: allergies, current medications, past family history, past medical history, past social history, past surgical history and problem list.  Review of Systems A comprehensive review of systems was negative. DentistKindergarden teacher, married for 11 years. Decreased libido.   Objective:    BP 131/86  Pulse 74  Ht 5\' 3"  (1.6 m)  Wt 216 lb (97.977 kg)  BMI 38.27 kg/m2  General Appearance:    Alert, cooperative, no distress, appears stated age  Head:    Normocephalic, without obvious abnormality, atraumatic  Eyes:    PERRL, conjunctiva/corneas clear, EOM's intact, fundi    benign, both eyes  Ears:    Normal TM's and external ear canals, both ears  Nose:   Nares normal, septum midline, mucosa normal, no drainage    or sinus tenderness  Throat:   Lips, mucosa, and tongue normal; teeth and gums normal  Neck:   Supple, symmetrical, trachea midline, no adenopathy;    thyroid:  no enlargement/tenderness/nodules; no carotid   bruit or JVD  Back:     Symmetric, no curvature, ROM normal, no CVA tenderness  Lungs:     Clear to auscultation bilaterally, respirations unlabored  Chest Wall:    No tenderness or deformity   Heart:    Regular  rate and rhythm, S1 and S2 normal, no murmur, rub   or gallop  Breast Exam:    No tenderness, masses, or nipple abnormality  Abdomen:     Soft, non-tender, bowel sounds active all four quadrants,    no masses, no organomegaly  Genitalia:    Normal female without lesion, discharge or tenderness     Extremities:   Extremities normal, atraumatic, no cyanosis or edema  Pulses:   2+ and symmetric all extremities  Skin:   Skin color, texture, turgor normal, no rashes or lesions  Lymph nodes:   Cervical, supraclavicular, and axillary nodes normal  Neurologic:   CNII-XII intact, normal strength, sensation and reflexes    throughout  .    Assessment:    Healthy female exam.  Continuous GSUI   Plan:     Breast self exam technique reviewed and patient encouraged to perform self-exam monthly. Mammogram. Thin prep Pap smear. urology referral  Fasting labs

## 2013-11-07 LAB — COMPREHENSIVE METABOLIC PANEL
ALT: 16 IU/L (ref 0–32)
AST: 12 IU/L (ref 0–40)
Albumin/Globulin Ratio: 1.3 (ref 1.1–2.5)
Albumin: 4 g/dL (ref 3.5–5.5)
Alkaline Phosphatase: 64 IU/L (ref 39–117)
BUN/Creatinine Ratio: 22 — ABNORMAL HIGH (ref 8–20)
BUN: 18 mg/dL (ref 6–20)
CO2: 25 mmol/L (ref 18–29)
CREATININE: 0.81 mg/dL (ref 0.57–1.00)
Calcium: 9.3 mg/dL (ref 8.7–10.2)
Chloride: 100 mmol/L (ref 97–108)
GFR calc Af Amer: 107 mL/min/{1.73_m2} (ref >59)
GFR calc non Af Amer: 93 mL/min/{1.73_m2} (ref >59)
GLOBULIN, TOTAL: 3.1 g/dL (ref 1.5–4.5)
Glucose: 98 mg/dL (ref 65–99)
Potassium: 4.1 mmol/L (ref 3.5–5.2)
SODIUM: 140 mmol/L (ref 134–144)
Total Bilirubin: 0.3 mg/dL (ref 0.0–1.2)
Total Protein: 7.1 g/dL (ref 6.0–8.5)

## 2013-11-07 LAB — LIPID PANEL
CHOL/HDL RATIO: 3.4 ratio (ref 0.0–4.4)
Cholesterol, Total: 169 mg/dL (ref 100–199)
HDL: 49 mg/dL (ref >39)
LDL CALC: 93 mg/dL (ref 0–99)
TRIGLYCERIDES: 133 mg/dL (ref 0–149)
VLDL Cholesterol Cal: 27 mg/dL (ref 5–40)

## 2013-11-07 LAB — CBC
HEMATOCRIT: 38.5 % (ref 34.0–46.6)
HEMOGLOBIN: 12.5 g/dL (ref 11.1–15.9)
MCH: 26 pg — AB (ref 26.6–33.0)
MCHC: 32.5 g/dL (ref 31.5–35.7)
MCV: 80 fL (ref 79–97)
Platelets: 287 10*3/uL (ref 150–379)
RBC: 4.81 x10E6/uL (ref 3.77–5.28)
RDW: 14.7 % (ref 12.3–15.4)
WBC: 10.2 10*3/uL (ref 3.4–10.8)

## 2013-11-07 LAB — HEMOGLOBIN A1C
ESTIMATED AVERAGE GLUCOSE: 108 mg/dL
HEMOGLOBIN A1C: 5.4 % (ref 4.8–5.6)

## 2013-11-07 LAB — TSH: TSH: 1.29 u[IU]/mL (ref 0.450–4.500)

## 2013-11-08 ENCOUNTER — Encounter: Payer: Self-pay | Admitting: Obstetrics & Gynecology

## 2013-11-10 LAB — PAP LB, RFX HPV ALL PTH: PAP Smear Comment: 0

## 2013-11-10 LAB — NICOTINE/COTININE METABOLITES
COTININE: NOT DETECTED ng/mL
Nicotine: NOT DETECTED ng/mL

## 2013-11-19 ENCOUNTER — Other Ambulatory Visit: Payer: Self-pay | Admitting: Family Medicine

## 2013-12-22 ENCOUNTER — Other Ambulatory Visit: Payer: Self-pay | Admitting: Family Medicine

## 2013-12-22 NOTE — Telephone Encounter (Signed)
will refill. Recently seen by OBGYN for CPE with normal TSH. Lab Results  Component Value Date   TSH 1.290 11/06/2013

## 2013-12-22 NOTE — Telephone Encounter (Signed)
Ok to refill? You haven't seen patient for anything other than acute visits in over 2 years.

## 2014-01-19 ENCOUNTER — Encounter: Payer: Self-pay | Admitting: Obstetrics & Gynecology

## 2014-02-04 ENCOUNTER — Ambulatory Visit (INDEPENDENT_AMBULATORY_CARE_PROVIDER_SITE_OTHER): Payer: BC Managed Care – PPO

## 2014-02-04 DIAGNOSIS — Z23 Encounter for immunization: Secondary | ICD-10-CM

## 2014-02-24 ENCOUNTER — Ambulatory Visit (INDEPENDENT_AMBULATORY_CARE_PROVIDER_SITE_OTHER): Payer: BC Managed Care – PPO | Admitting: Nurse Practitioner

## 2014-02-24 ENCOUNTER — Encounter: Payer: Self-pay | Admitting: Nurse Practitioner

## 2014-02-24 VITALS — BP 125/85 | HR 81 | Ht 63.0 in | Wt 221.2 lb

## 2014-02-24 DIAGNOSIS — G43909 Migraine, unspecified, not intractable, without status migrainosus: Secondary | ICD-10-CM

## 2014-02-24 DIAGNOSIS — F419 Anxiety disorder, unspecified: Secondary | ICD-10-CM

## 2014-02-24 DIAGNOSIS — F411 Generalized anxiety disorder: Secondary | ICD-10-CM | POA: Insufficient documentation

## 2014-02-24 DIAGNOSIS — F329 Major depressive disorder, single episode, unspecified: Secondary | ICD-10-CM

## 2014-02-24 DIAGNOSIS — F32A Depression, unspecified: Secondary | ICD-10-CM

## 2014-02-24 MED ORDER — VENLAFAXINE HCL ER 75 MG PO CP24: 75.0000 mg | ORAL_CAPSULE | Freq: Three times a day (TID) | ORAL | Status: DC

## 2014-02-24 MED ORDER — TOPIRAMATE ER 50 MG PO CAP24: 50.0000 mg | ORAL_CAPSULE | Freq: Every day | ORAL | Status: DC

## 2014-02-24 NOTE — Progress Notes (Signed)
History:  Tamara Shaffer is a 38 y.o. G2P2 who [facility] for follow up with migraines, anxiety and depression. She is doing very well with her migraines and often only has one per month. She is doing worse with anxiety and depression secondary to work issues and lack of exercise. She often gets worse at this time of year. She had a scare this spring when she thought she was having an MI that turned out to be a panic attack. She was seen in ED.   The following portions of the patient's history were reviewed and updated as appropriate: allergies, current medications, past family history, past medical history, past social history, past surgical history and problem list.  Review of Systems:    Objective:  Physical Exam BP 125/85 mmHg  Pulse 81  Ht 5\' 3"  (1.6 m)  Wt 221 lb 3.2 oz (100.336 kg)  BMI 39.19 kg/m2  LMP 01/29/2014 (Exact Date) GENERAL: Well-developed, well-nourished female in no acute distress. Appears very anxious HEENT: Normocephalic, atraumatic.  NECK: Supple. Normal thyroid.  LUNGS: Normal rate. Clear to auscultation bilaterally.  HEART: Regular rate and rhythm with no adventitious sounds.  EXTREMITIES: No cyanosis, clubbing, or edema, 2+ distal pulses.   Labs and Imaging No results found.  Assessment & Plan:  Assessment:  Migraine/ doing well Anxiety/ Depression  Plans:  Will increase Effexor to 75 mg TID Add Trokendi 50 mg qhs RTC one year or prn  Delbert PhenixLinda M Vic Esco, NP 02/24/2014 4:34 PM

## 2014-02-24 NOTE — Patient Instructions (Signed)

## 2014-03-10 ENCOUNTER — Telehealth: Payer: Self-pay | Admitting: *Deleted

## 2014-03-10 DIAGNOSIS — IMO0001 Reserved for inherently not codable concepts without codable children: Secondary | ICD-10-CM

## 2014-03-10 DIAGNOSIS — R030 Elevated blood-pressure reading, without diagnosis of hypertension: Principal | ICD-10-CM

## 2014-03-10 MED ORDER — HYDROCHLOROTHIAZIDE 25 MG PO TABS: 25.0000 mg | ORAL_TABLET | Freq: Every day | ORAL | Status: DC

## 2014-03-10 NOTE — Telephone Encounter (Signed)
HCTZ meds called into pharmacy.

## 2014-04-04 ENCOUNTER — Other Ambulatory Visit: Payer: Self-pay | Admitting: Family Medicine

## 2014-05-07 ENCOUNTER — Ambulatory Visit (INDEPENDENT_AMBULATORY_CARE_PROVIDER_SITE_OTHER): Payer: BC Managed Care – PPO | Admitting: Internal Medicine

## 2014-05-07 ENCOUNTER — Encounter: Payer: Self-pay | Admitting: Internal Medicine

## 2014-05-07 VITALS — BP 122/88 | HR 84 | Temp 99.4°F | Wt 214.0 lb

## 2014-05-07 DIAGNOSIS — J029 Acute pharyngitis, unspecified: Secondary | ICD-10-CM

## 2014-05-07 DIAGNOSIS — J020 Streptococcal pharyngitis: Secondary | ICD-10-CM

## 2014-05-07 LAB — POCT RAPID STREP A (OFFICE): RAPID STREP A SCREEN: POSITIVE — AB

## 2014-05-07 MED ORDER — AZITHROMYCIN 250 MG PO TABS: ORAL_TABLET | ORAL | Status: DC

## 2014-05-07 MED ORDER — METHYLPREDNISOLONE ACETATE 80 MG/ML IJ SUSP
80.0000 mg | Freq: Once | INTRAMUSCULAR | Status: DC
  Administered 2014-05-07: 80 mg via INTRAMUSCULAR

## 2014-05-07 NOTE — Patient Instructions (Signed)

## 2014-05-07 NOTE — Progress Notes (Signed)
HPI  Pt presents to the clinic today with c/o sore throat. She reports this started yesterday. She reports fever and pain with swallowing. She also noticed that her eyes were crusted up this morning but denies redness or pain in the eyes. She has tried Tylenol without much relief. She has also been using her essential oils. She does have a history of seasonal allergies but denies breathing problems. She has had sick contacts with kids who had pink eye and strep throat. Her 2 sons are also sick as well.  Review of Systems    Past Medical History  Diagnosis Date  . GDM (gestational diabetes mellitus)   . Hypothyroidism     medically treated  . 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 Insomnia   . GERD (gastroesophageal reflux disease)   . Allergic rhinitis     seasonal  . Prediabetes     A1c 5.8% 11/2010  . Vulvar burning   . Urinary incontinence   . Obesity     Family History  Problem Relation Age of Onset  . Hypertension Father   . Heart disease Father     Porcine Valve Replacement  . Cancer Maternal Grandmother     Ovarian cancer  . Diabetes Neg Hx   . Stroke Neg Hx   . Cancer Cousin     Thyroid cancer    History   Social History  . Marital Status: Married    Spouse Name: N/A  . Number of Children: 2  . Years of Education: N/A   Occupational History  . Teaches school, 2nd grade    Social History Main Topics  . Smoking status: Never Smoker   . Smokeless tobacco: Never Used  . Alcohol Use: Yes     Comment: Socially  . Drug Use: No  . Sexual Activity:    Partners: Male    Birth Control/ Protection: Condom   Other Topics Concern  . Not on file   Social History Narrative   Caffeine:  1 cup daily    Allergies  Allergen Reactions  . Erythromycin     REACTION: GI upset, rash, SOB  . Penicillins     REACTION: as child, ok with amoxicillin in past     Constitutional: Positive headache, fatigue and fever.  Denies abrupt weight changes.  HEENT:  Positive sore throat. Denies eye redness, ear pain, ringing in the ears, wax buildup, nasal congestion, runny nose or bloody nose. Respiratory:  Denies cough, difficulty breathing or shortness of breath.  Cardiovascular: Denies chest pain, chest tightness, palpitations or swelling in the hands or feet.   No other specific complaints in a complete review of systems (except as listed in HPI above).  Objective:  BP 122/88 mmHg  Pulse 84  Temp(Src) 99.4 F (37.4 C) (Oral)  Wt 214 lb (97.07 kg)  SpO2 98%   General: Appears her stated age, ill appearing in NAD. HEENT: Head: normal shape and size, no sinus tenderness noted; Eyes: sclera white, no icterus, conjunctiva pink, no exudate noted; Ears: Tm's gray and intact, normal light reflex; Nose: mucosa pink and moist, septum midline; Throat/Mouth: Teeth present, mucosa erythematous and moist, tonsil 3+, no exudate noted, no lesions or ulcerations noted.  Neck: No adenopathy noted. Cardiovascular: Normal rate and rhythm. S1,S2 noted.  No murmur, rubs or gallops noted.  Pulmonary/Chest: Normal effort and positive vesicular breath sounds. No respiratory distress. No wheezes, rales or ronchi noted.  Assessment & Plan:   Strep Pharyngitis:  RST: positive No need to send throat culture Allergic to PCN, entire family allergic to Cephalosporins eRx for Azithromax x 5 days 80 mg Depo IM today for throat swelling Work note provided to return Monday  RTC as needed or if symptoms persist.

## 2014-05-07 NOTE — Progress Notes (Signed)
Pre visit review using our clinic review tool, if applicable. No additional management support is needed unless otherwise documented below in the visit note. 

## 2014-05-18 ENCOUNTER — Other Ambulatory Visit: Payer: Self-pay | Admitting: *Deleted

## 2014-05-18 MED ORDER — SYNTHROID 125 MCG PO TABS: 125.0000 ug | ORAL_TABLET | Freq: Every day | ORAL | Status: DC

## 2014-07-06 ENCOUNTER — Other Ambulatory Visit: Payer: Self-pay | Admitting: Family Medicine

## 2014-07-06 ENCOUNTER — Other Ambulatory Visit: Payer: Self-pay | Admitting: *Deleted

## 2014-07-06 DIAGNOSIS — F32A Depression, unspecified: Secondary | ICD-10-CM

## 2014-07-06 DIAGNOSIS — R030 Elevated blood-pressure reading, without diagnosis of hypertension: Principal | ICD-10-CM

## 2014-07-06 DIAGNOSIS — F419 Anxiety disorder, unspecified: Secondary | ICD-10-CM

## 2014-07-06 DIAGNOSIS — F329 Major depressive disorder, single episode, unspecified: Secondary | ICD-10-CM

## 2014-07-06 DIAGNOSIS — G43911 Migraine, unspecified, intractable, with status migrainosus: Secondary | ICD-10-CM

## 2014-07-06 DIAGNOSIS — IMO0001 Reserved for inherently not codable concepts without codable children: Secondary | ICD-10-CM

## 2014-07-06 MED ORDER — HYDROCHLOROTHIAZIDE 25 MG PO TABS: 25.0000 mg | ORAL_TABLET | Freq: Every day | ORAL | Status: DC

## 2014-07-06 MED ORDER — TOPIRAMATE ER 50 MG PO CAP24: 50.0000 mg | ORAL_CAPSULE | Freq: Every day | ORAL | Status: DC

## 2014-07-06 MED ORDER — VENLAFAXINE HCL ER 75 MG PO CP24: 75.0000 mg | ORAL_CAPSULE | Freq: Three times a day (TID) | ORAL | Status: DC

## 2014-07-06 MED ORDER — SUMATRIPTAN SUCCINATE 100 MG PO TABS: 100.0000 mg | ORAL_TABLET | Freq: Once | ORAL | Status: DC | PRN

## 2014-07-06 NOTE — Telephone Encounter (Signed)
Patient is needing all prescriptions sent through her mail order [facility]

## 2014-07-16 ENCOUNTER — Other Ambulatory Visit: Payer: Self-pay | Admitting: Family Medicine

## 2014-08-03 ENCOUNTER — Telehealth: Payer: Self-pay | Admitting: Obstetrics & Gynecology

## 2014-08-03 DIAGNOSIS — B379 Candidiasis, unspecified: Secondary | ICD-10-CM

## 2014-08-03 MED ORDER — FLUCONAZOLE 150 MG PO TABS: 150.0000 mg | ORAL_TABLET | Freq: Every day | ORAL | Status: DC

## 2014-08-03 NOTE — Addendum Note (Signed)
Addended by: Barbara CowerNOGUES, Jenee Spaugh L on: 08/03/2014 10:47 AM   Modules accepted: Orders

## 2014-08-03 NOTE — Telephone Encounter (Signed)
Pt on antibiotic, wants something for yeast infection

## 2014-08-03 NOTE — Telephone Encounter (Signed)
Sent medication to patients pharmacy.  

## 2014-08-13 ENCOUNTER — Ambulatory Visit (INDEPENDENT_AMBULATORY_CARE_PROVIDER_SITE_OTHER): Payer: BC Managed Care – PPO | Admitting: Internal Medicine

## 2014-08-13 ENCOUNTER — Encounter: Payer: Self-pay | Admitting: Internal Medicine

## 2014-08-13 VITALS — BP 120/84 | HR 91 | Temp 100.0°F | Wt 207.0 lb

## 2014-08-13 DIAGNOSIS — R509 Fever, unspecified: Secondary | ICD-10-CM

## 2014-08-13 DIAGNOSIS — J020 Streptococcal pharyngitis: Secondary | ICD-10-CM | POA: Diagnosis not present

## 2014-08-13 LAB — POCT INFLUENZA A/B: Influenza A, POC: NEGATIVE

## 2014-08-13 LAB — POCT RAPID STREP A (OFFICE): RAPID STREP A SCREEN: POSITIVE — AB

## 2014-08-13 MED ORDER — AZITHROMYCIN 250 MG PO TABS: ORAL_TABLET | ORAL | Status: DC

## 2014-08-13 NOTE — Progress Notes (Signed)
HPI  Pt presents to the clinic today with c/o sore throat, headache, fever, chills and body aches. She reports this started last night. She has had some difficulty swallowing. She has run fever as high as 102.0. She has tried Tylenol with some relief. 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 She has not noticed any rashes or abdominal pain. She does have seasonal allergies but denies breathing problems.  Review of Systems      Past Medical History  Diagnosis Date  . GDM (gestational diabetes mellitus)   . Hypothyroidism     medically treated  . 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 Insomnia   . GERD (gastroesophageal reflux disease)   . Allergic rhinitis     seasonal  . Prediabetes     A1c 5.8% 11/2010  . Vulvar burning   . Urinary incontinence   . Obesity     Family History  Problem Relation Age of Onset  . Hypertension Father   . Heart disease Father     Porcine Valve Replacement  . Cancer Maternal Grandmother     Ovarian cancer  . Diabetes Neg Hx   . Stroke Neg Hx   . Cancer Cousin     Thyroid cancer    History   Social History  . Marital Status: Married    Spouse Name: N/A  . Number of Children: 2  . Years of Education: N/A   Occupational History  . Teaches school, 2nd grade    Social History Main Topics  . Smoking status: Never Smoker   . Smokeless tobacco: Never Used  . Alcohol Use: Yes     Comment: Socially  . Drug Use: No  . Sexual Activity:    Partners: Male    Birth Control/ Protection: Condom   Other Topics Concern  . Not on file   Social History Narrative   Caffeine:  1 cup daily    Allergies  Allergen Reactions  . Erythromycin     REACTION: GI upset, rash, SOB  . Penicillins     REACTION: as child, ok with amoxicillin in past     Constitutional: Positive headache, fatigue and fever. Denies abrupt weight  changes.  HEENT:  Positive sore throat. Denies eye redness, eye pain, pressure behind the eyes, facial pain, nasal congestion, ear pain, ringing in the ears, wax buildup, runny nose or bloody nose. Respiratory:  Denies cough, difficulty breathing or shortness of breath.  Cardiovascular: Denies chest pain, chest tightness, palpitations or swelling in the hands or feet.   No other specific complaints in a complete review of systems (except as listed in HPI above).  Objective:   BP 120/84 mmHg  Pulse 91  Temp(Src) 100 F (37.8 C) (Oral)  Wt 207 lb (93.895 kg)  Wt Readings from Last 3 Encounters:  08/13/14 207 lb (93.895 kg)  05/07/14 214 lb (97.07 kg)  02/24/14 221 lb 3.2 oz (100.336 kg)     General: Appears her stated age, ill appearing in NAD. HEENT: Head: normal shape and size; Eyes: sclera white, no icterus, conjunctiva pink, Throat/Mouth: Teeth present, mucosa erythematous and moist, patchy white exudate noted on bilateral tonsillar pillars, no lesions or ulcerations noted.  Neck: Cervical lymphadenopathy noted.  Cardiovascular: Tachycardic with normal rhythm. S1,S2 noted.  No murmur, rubs or gallops noted.  Pulmonary/Chest: Normal effort and positive vesicular  breath sounds. No respiratory distress. No wheezes, rales or ronchi noted.      Assessment & Plan:   Strep Pharyngitis:  Rapid flu: negative Rapid strep: positive Get some rest and drink plenty of water Do salt water gargles for the sore throat Ibuprofen for fever and inflammation eRx for Azithromax x 5 days Work note to return Tuesday   RTC as needed or if symptoms persist.

## 2014-08-13 NOTE — Progress Notes (Signed)
Pre visit review using our clinic review tool, if applicable. No additional management support is needed unless otherwise documented below in the visit note. 

## 2014-08-13 NOTE — Patient Instructions (Signed)

## 2014-08-13 NOTE — Addendum Note (Signed)
Addended by: Roena MaladyEVONTENNO, Trayveon Beckford Y on: 08/13/2014 04:28 PM   Modules accepted: Orders

## 2014-08-31 ENCOUNTER — Telehealth: Payer: Self-pay | Admitting: *Deleted

## 2014-08-31 DIAGNOSIS — F419 Anxiety disorder, unspecified: Secondary | ICD-10-CM

## 2014-08-31 DIAGNOSIS — F32A Depression, unspecified: Secondary | ICD-10-CM

## 2014-08-31 DIAGNOSIS — F329 Major depressive disorder, single episode, unspecified: Secondary | ICD-10-CM

## 2014-08-31 MED ORDER — VENLAFAXINE HCL ER 75 MG PO CP24: 75.0000 mg | ORAL_CAPSULE | Freq: Three times a day (TID) | ORAL | Status: DC

## 2014-08-31 NOTE — Telephone Encounter (Signed)
Patient needs effexor called to pharmacy for a few days of treatment due to mishap with her mail order arriving on time.

## 2014-11-06 ENCOUNTER — Other Ambulatory Visit: Payer: Self-pay | Admitting: Obstetrics & Gynecology

## 2014-11-06 ENCOUNTER — Other Ambulatory Visit: Payer: Self-pay | Admitting: Family Medicine

## 2014-11-09 ENCOUNTER — Encounter: Payer: Self-pay | Admitting: *Deleted

## 2014-11-21 ENCOUNTER — Other Ambulatory Visit: Payer: Self-pay | Admitting: Physician Assistant

## 2015-01-07 ENCOUNTER — Ambulatory Visit (INDEPENDENT_AMBULATORY_CARE_PROVIDER_SITE_OTHER): Payer: BC Managed Care – PPO | Admitting: Family Medicine

## 2015-01-07 ENCOUNTER — Encounter: Payer: Self-pay | Admitting: Family Medicine

## 2015-01-07 VITALS — BP 116/80 | HR 80 | Temp 98.5°F | Wt 201.8 lb

## 2015-01-07 DIAGNOSIS — J019 Acute sinusitis, unspecified: Secondary | ICD-10-CM | POA: Insufficient documentation

## 2015-01-07 DIAGNOSIS — J029 Acute pharyngitis, unspecified: Secondary | ICD-10-CM | POA: Diagnosis not present

## 2015-01-07 DIAGNOSIS — E669 Obesity, unspecified: Secondary | ICD-10-CM

## 2015-01-07 LAB — POCT RAPID STREP A (OFFICE): Rapid Strep A Screen: NEGATIVE

## 2015-01-07 NOTE — Progress Notes (Signed)
BP 116/80 mmHg  Pulse 80  Temp(Src) 98.5 F (36.9 C) (Oral)  Wt 201 lb 12 oz (91.513 kg)  SpO2 97%   CC: cough, discuss BMI exemption  Subjective:    Patient ID: Tamara Shaffer, female    DOB: 02/26/1976, 39 y.o.   MRN: [id number]  HPI: Tamara LintChantel E Wernette is a 39 y.o. female presenting on 01/07/2015 for Cough and Fill out forms   Last seen by me 03/2013. Recent recurrent strep infections (04/2014, 07/2014).   2 wk h/o sinus pressure, facial pain, congestion, cough. Sunday morning woke up with swollen glands, sore throat and pain with swallowing. + fever and chills and PNdrainage.  No ear or tooth pain,  No smokers at home. Treated with OTC remedies, tylenol.  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 Assistant with bronchitis as well.   03/2014 235lbs heaviest she had been - decided to change lifestyle for family health. Started advacare herbal supplement. Changed to beach body food and exercise regimen. Plateaued over summer but now back on regimen. Nadir weight this year has been 195 lb. Has health coach she talks with weekly. Exercise - 30 min workout daily (zumba, yoga, weights, upper body, lower body).   Relevant past medical, surgical, family and social history reviewed and updated as indicated. Interim medical history since our last visit reviewed. Allergies and medications reviewed and updated. Current Outpatient Prescriptions on File Prior to Visit  Medication Sig  . hydrochlorothiazide (HYDRODIURIL) 25 MG tablet Take 1 tablet (25 mg total) by mouth daily.  . Multiple Vitamin (MULTIVITAMIN) tablet Take 1 tablet by mouth daily.    . SUMAtriptan (IMITREX) 100 MG tablet Take 1 tablet (100 mg total) by mouth once as needed for migraine.  Marland Kitchen. SYNTHROID 125 MCG tablet Take 1 tablet by mouth  daily before breakfast  . Topiramate ER (TROKENDI XR) 50 MG CP24 Take 50 mg by mouth at bedtime.  Marland Kitchen. aspirin 81 MG tablet Take 81 mg by mouth daily.  . fluticasone (FLONASE) 50 MCG/ACT  nasal spray Place 2 sprays into both nostrils daily. (Patient not taking: Reported on 08/13/2014)   No current facility-administered medications on file prior to visit.    Review of Systems Per HPI unless specifically indicated above     Objective:    BP 116/80 mmHg  Pulse 80  Temp(Src) 98.5 F (36.9 C) (Oral)  Wt 201 lb 12 oz (91.513 kg)  SpO2 97%  Wt Readings from Last 3 Encounters:  01/07/15 201 lb 12 oz (91.513 kg)  08/13/14 207 lb (93.895 kg)  05/07/14 214 lb (97.07 kg)   Body mass index is 35.75 kg/(m^2).  Physical Exam  Constitutional: She appears well-developed and well-nourished. No distress.  HENT:  Head: Normocephalic and atraumatic.  Right Ear: Hearing, tympanic membrane, external ear and ear canal normal.  Left Ear: Hearing, tympanic membrane, external ear and ear canal normal.  Nose: Mucosal edema (mild) present. No rhinorrhea. Right sinus exhibits no maxillary sinus tenderness and no frontal sinus tenderness. Left sinus exhibits no maxillary sinus tenderness and no frontal sinus tenderness.  Mouth/Throat: Uvula is midline and mucous membranes are normal. Posterior oropharyngeal erythema (mild) present. No oropharyngeal exudate, posterior oropharyngeal edema or tonsillar abscesses.  Eyes: Conjunctivae and EOM are normal. Pupils are equal, round, and reactive to light. No scleral icterus.  Neck: Normal range of motion. Neck supple.  Cardiovascular: Normal rate, regular rhythm, normal heart sounds and intact distal pulses.   No murmur heard. Pulmonary/Chest:  Effort normal and breath sounds normal. No respiratory distress. She has no wheezes. She has no rales.  Lymphadenopathy:    She has no cervical adenopathy.  Skin: Skin is warm and dry. No rash noted.  Nursing note and vitals reviewed.  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      Acute sinusitis - Primary    Anticipate viral as improving on its own. 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 If positive, treat with zpack. If negative, further supportive care. Pt agrees with plan. RST - negative      Relevant Medications   loratadine (CLARITIN) 10 MG tablet    Other Visit Diagnoses    Sore throat        Relevant Orders    Rapid Strep A (Completed)        Follow up plan: Return if symptoms worsen or fail to improve.

## 2015-01-07 NOTE — Assessment & Plan Note (Signed)
BMI exception work form filled out today.

## 2015-01-07 NOTE — Progress Notes (Signed)
Pre visit review using our clinic review tool, if applicable. No additional management support is needed unless otherwise documented below in the visit note. 

## 2015-01-07 NOTE — Patient Instructions (Addendum)
Work form filled out. Congratulations on weight loss and healthy diet and lifestyle changes to date! Keep it up. Strep test today - negative. I think this is viral sinusitis. Treat with continued fluids and rest. Use ibuprofen 400-600mg  twice daily with food.  Watch for return of fever >101, or worsening productive cough. If this happens, call us for antibiotic course.

## 2015-01-07 NOTE — Assessment & Plan Note (Addendum)
Anticipate viral as improving on its own. 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 If positive, treat with zpack. If negative, further supportive care. Pt agrees with plan. RST - negative

## 2015-02-04 ENCOUNTER — Ambulatory Visit (INDEPENDENT_AMBULATORY_CARE_PROVIDER_SITE_OTHER): Payer: BC Managed Care – PPO

## 2015-02-04 DIAGNOSIS — Z23 Encounter for immunization: Secondary | ICD-10-CM

## 2015-02-07 ENCOUNTER — Other Ambulatory Visit: Payer: Self-pay | Admitting: Physician Assistant

## 2015-02-09 NOTE — Telephone Encounter (Signed)
Pt needs appointment asap.

## 2015-02-13 ENCOUNTER — Other Ambulatory Visit: Payer: Self-pay | Admitting: Family Medicine

## 2015-03-31 ENCOUNTER — Telehealth: Payer: Self-pay

## 2015-03-31 NOTE — Telephone Encounter (Signed)
Pt left v/m; mail order pharmacy has changed to [facility]; [facility] about needed refills and pt wanted FYI that [facility] is her mail order pharmacy now. Updated pharmacy list.

## 2015-04-01 ENCOUNTER — Other Ambulatory Visit: Payer: Self-pay | Admitting: *Deleted

## 2015-04-01 MED ORDER — SYNTHROID 125 MCG PO TABS: 125.0000 ug | ORAL_TABLET | Freq: Every day | ORAL | Status: DC

## 2015-04-01 MED ORDER — OMEPRAZOLE 20 MG PO CPDR
20.0000 mg | DELAYED_RELEASE_CAPSULE | Freq: Every day | ORAL | Status: DC
Start: 2015-04-01 — End: 2015-05-31

## 2015-04-05 ENCOUNTER — Other Ambulatory Visit: Payer: Self-pay | Admitting: *Deleted

## 2015-04-05 DIAGNOSIS — IMO0001 Reserved for inherently not codable concepts without codable children: Secondary | ICD-10-CM

## 2015-04-05 DIAGNOSIS — F32A Depression, unspecified: Secondary | ICD-10-CM

## 2015-04-05 DIAGNOSIS — R030 Elevated blood-pressure reading, without diagnosis of hypertension: Principal | ICD-10-CM

## 2015-04-05 DIAGNOSIS — F329 Major depressive disorder, single episode, unspecified: Secondary | ICD-10-CM

## 2015-04-05 MED ORDER — VENLAFAXINE HCL ER 75 MG PO CP24: 75.0000 mg | ORAL_CAPSULE | Freq: Three times a day (TID) | ORAL | Status: DC

## 2015-04-05 MED ORDER — HYDROCHLOROTHIAZIDE 25 MG PO TABS: 25.0000 mg | ORAL_TABLET | Freq: Every day | ORAL | Status: DC

## 2015-04-05 NOTE — Telephone Encounter (Signed)
Patient requested refills be sent to [facility] mail order.  I have sent over refills on prescriptions Hydrochlorothiazide and Effexor.

## 2015-05-19 ENCOUNTER — Encounter: Payer: Self-pay | Admitting: Family Medicine

## 2015-05-19 ENCOUNTER — Ambulatory Visit (INDEPENDENT_AMBULATORY_CARE_PROVIDER_SITE_OTHER): Payer: BC Managed Care – PPO | Admitting: Family Medicine

## 2015-05-19 VITALS — BP 110/80 | HR 68 | Temp 98.8°F | Wt 207.5 lb

## 2015-05-19 DIAGNOSIS — E039 Hypothyroidism, unspecified: Secondary | ICD-10-CM

## 2015-05-19 DIAGNOSIS — E049 Nontoxic goiter, unspecified: Secondary | ICD-10-CM | POA: Diagnosis not present

## 2015-05-19 DIAGNOSIS — R5382 Chronic fatigue, unspecified: Secondary | ICD-10-CM | POA: Diagnosis not present

## 2015-05-19 DIAGNOSIS — E010 Iodine-deficiency related diffuse (endemic) goiter: Secondary | ICD-10-CM

## 2015-05-19 DIAGNOSIS — M25511 Pain in right shoulder: Secondary | ICD-10-CM | POA: Diagnosis not present

## 2015-05-19 MED ORDER — NAPROXEN 500 MG PO TABS: ORAL_TABLET | ORAL | Status: DC

## 2015-05-19 NOTE — Assessment & Plan Note (Addendum)
Due for labwork - recheck today. Check thyroid US for possible R nodule/thyromegaly.

## 2015-05-19 NOTE — Patient Instructions (Addendum)
labwork today to check thyroid. Pass by Marion's office to schedule thyroid ultrasound. I think you have right shoulder bursitis - treat with ice, rest, naprosyn twice daily with food for 5 days then as needed, and exercises provided today Let us know if not better, would consider steroid shot.

## 2015-05-19 NOTE — Progress Notes (Signed)
BP 110/80 mmHg  Pulse 68  Temp(Src) 98.8 F (37.1 C) (Oral)  Wt 207 lb 8 oz (94.121 kg)  LMP 05/04/2015   CC: discuss R shoulder pain, hot flashes  Subjective:    Patient ID: Tamara Shaffer, female    DOB: 04-Nov-1975, 40 y.o.   MRN: [id number]  HPI: [name] is a 40 y.o. female presenting on 05/19/2015 for Shoulder Pain and Hot Flashes   Recent visit to [facility] with mom's new dx of cancer.   4 month h/o R shoulder pain (since November). Ongoing discomfort. Unsure of inciting trauma. Worsening pain, more frequent pain. No neck pain, numbness/weakness of arm. So far has tried tylenol/ibuprofen with temporary relief. 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   Worsening hot flashes - and recent dental visit noted thyromegaly. Complaint with brand synthroid daily - due for labs, reviewed administration. No changes in cycle. LMP 05/04/2015. Feeling well otherwise. No weight changes, fevers/chills, headaches.  Lab Results  Component Value Date   TSH 1.290 11/06/2013    Hypothyroidism diagnosed 2009, on treatment since then. Never hyperthyroid.  Relevant past medical, surgical, family and social history reviewed and updated as indicated. Interim medical history since our last visit reviewed. Allergies and medications reviewed and updated. Current Outpatient Prescriptions on File Prior to Visit  Medication Sig  . hydrochlorothiazide (HYDRODIURIL) 25 MG tablet Take 1 tablet (25 mg total) by mouth daily.  Marland Kitchen loratadine (CLARITIN) 10 MG tablet Take 10 mg by mouth daily.  . Multiple Vitamin (MULTIVITAMIN) tablet Take 1 tablet by mouth daily.    Marland Kitchen omeprazole (PRILOSEC) 20 MG capsule Take 1 capsule (20 mg total) by mouth daily.  . SUMAtriptan (IMITREX) 100 MG tablet Take 1 tablet (100 mg total) by mouth once as needed for migraine.  Marland Kitchen SYNTHROID 125 MCG tablet Take 1 tablet (125 mcg total) by mouth daily before breakfast. Appt needed for additional refills.  . Topiramate ER  (TROKENDI XR) 50 MG CP24 Take 50 mg by mouth at bedtime.  Marland Kitchen venlafaxine XR (EFFEXOR-XR) 75 MG 24 hr capsule Take 1 capsule (75 mg total) by mouth 3 (three) times daily.  . fluticasone (FLONASE) 50 MCG/ACT nasal spray Place 2 sprays into both nostrils daily. (Patient not taking: Reported on 08/13/2014)   No current facility-administered medications on file prior to visit.   Past Medical History  Diagnosis Date  . GDM (gestational diabetes mellitus)   . Hypothyroidism     medically treated  . 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 Insomnia   . GERD (gastroesophageal reflux disease)   . Allergic rhinitis     seasonal  . Prediabetes     A1c 5.8% 11/2010  . Vulvar burning   . Urinary incontinence   . Obesity     Past Surgical History  Procedure Laterality Date  . Cholecystectomy    . Breast surgery  06/1994    Reconstruction  . Ectopic pregnancy surgery  10/2008    Tube remained  . Thyroid US  06/2010    heterogeneous normal sized thyroid gland consistent with chronic thyroiditis     Family History  Problem Relation Age of Onset  . Hypertension Father   . Heart disease Father     Porcine Valve Replacement  . Cancer Maternal Grandmother     Ovarian cancer  . Diabetes Neg Hx   . Stroke Neg Hx   . Cancer  Cousin     Thyroid cancer  . Cancer Mother     leiomyosarcoma of abdomen    Review of Systems Per HPI unless specifically indicated in ROS section     Objective:    BP 110/80 mmHg  Pulse 68  Temp(Src) 98.8 F (37.1 C) (Oral)  Wt 207 lb 8 oz (94.121 kg)  LMP 05/04/2015  Wt Readings from Last 3 Encounters:  05/19/15 207 lb 8 oz (94.121 kg)  01/07/15 201 lb 12 oz (91.513 kg)  08/13/14 207 lb (93.895 kg)    Physical Exam  Constitutional: She appears well-developed and well-nourished. No distress.  Neck: Normal range of motion. Neck supple. Thyromegaly (?R nodule) present.  Cardiovascular: Normal rate, regular  rhythm, normal heart sounds and intact distal pulses.   No murmur heard. Pulmonary/Chest: Effort normal and breath sounds normal. No respiratory distress. She has no wheezes. She has no rales.  Musculoskeletal: She exhibits no edema.  L shoulder WNL R Shoulder exam: No deformity of shoulders on inspection. + tender to palpation at R subacromial bursa FROM in abduction and forward flexion. No pain or weakness with testing SITS in ext/int rotation. No pain with empty can sign. Neg Yerguson, Speed test. No impingement. No pain with crossover test. No pain with rotation of humeral head in GH joint.   Lymphadenopathy:    She has no cervical adenopathy.  Neurological: She has normal strength. No sensory deficit.  5/5 strength BLE  Skin: Skin is warm and dry. No rash noted.  Nursing note and vitals reviewed.      Assessment & Plan:   Problem List Items Addressed This Visit    Right shoulder pain    Anticipate right subacromial bursitis. Discussed treatment: ice, rest, NSAID (with GERD precautions), and provided with exercises from [facility] pt advisor. Update if not improved with treatment, consider steroid injection. Pt will let me know if no better.      Hypothyroidism - Primary    Due for labwork - recheck today. Check thyroid US for possible R nodule/thyromegaly.      Relevant Orders   US Soft Tissue Head/Neck   TSH   T3   T4, free   Fatigue   Relevant Orders   CBC with Differential/Platelet   Basic metabolic panel    Other Visit Diagnoses    Thyromegaly        Relevant Orders    US Soft Tissue Head/Neck        Follow up plan: Return if symptoms worsen or fail to improve.

## 2015-05-19 NOTE — Assessment & Plan Note (Signed)
Anticipate right subacromial bursitis. Discussed treatment: ice, rest, NSAID (with GERD precautions), and provided with exercises from [facility] pt advisor. Update if not improved with treatment, consider steroid injection. Pt will let me know if no better.

## 2015-05-19 NOTE — Progress Notes (Signed)
Pre visit review using our clinic review tool, if applicable. No additional management support is needed unless otherwise documented below in the visit note. 

## 2015-05-19 NOTE — Addendum Note (Signed)
Addended by: Alvina Chou on: 05/19/2015 12:00 PM   Modules accepted: Orders

## 2015-05-20 LAB — T3: T3 TOTAL: 94 ng/dL (ref 71–180)

## 2015-05-20 LAB — CBC WITH DIFFERENTIAL/PLATELET
BASOS ABS: 0 10*3/uL (ref 0.0–0.2)
Basos: 0 %
EOS (ABSOLUTE): 0.3 10*3/uL (ref 0.0–0.4)
Eos: 3 %
Hematocrit: 38 % (ref 34.0–46.6)
Hemoglobin: 12.6 g/dL (ref 11.1–15.9)
Immature Grans (Abs): 0 10*3/uL (ref 0.0–0.1)
Immature Granulocytes: 0 %
LYMPHS ABS: 3.2 10*3/uL — AB (ref 0.7–3.1)
LYMPHS: 28 %
MCH: 26.5 pg — AB (ref 26.6–33.0)
MCHC: 33.2 g/dL (ref 31.5–35.7)
MCV: 80 fL (ref 79–97)
Monocytes Absolute: 0.6 10*3/uL (ref 0.1–0.9)
Monocytes: 5 %
NEUTROS ABS: 7.2 10*3/uL — AB (ref 1.4–7.0)
Neutrophils: 64 %
PLATELETS: 281 10*3/uL (ref 150–379)
RBC: 4.75 x10E6/uL (ref 3.77–5.28)
RDW: 14 % (ref 12.3–15.4)
WBC: 11.3 10*3/uL — ABNORMAL HIGH (ref 3.4–10.8)

## 2015-05-20 LAB — TSH: TSH: 1.66 u[IU]/mL (ref 0.450–4.500)

## 2015-05-20 LAB — BASIC METABOLIC PANEL
BUN / CREAT RATIO: 15 (ref 8–20)
BUN: 13 mg/dL (ref 6–20)
CALCIUM: 9.5 mg/dL (ref 8.7–10.2)
CHLORIDE: 100 mmol/L (ref 96–106)
CO2: 24 mmol/L (ref 18–29)
Creatinine, Ser: 0.87 mg/dL (ref 0.57–1.00)
GFR calc non Af Amer: 84 mL/min/{1.73_m2} (ref >59)
GFR, EST AFRICAN AMERICAN: 97 mL/min/{1.73_m2} (ref >59)
Glucose: 88 mg/dL (ref 65–99)
POTASSIUM: 4 mmol/L (ref 3.5–5.2)
SODIUM: 140 mmol/L (ref 134–144)

## 2015-05-20 LAB — T4, FREE: Free T4: 1.15 ng/dL (ref 0.82–1.77)

## 2015-05-22 ENCOUNTER — Other Ambulatory Visit: Payer: Self-pay | Admitting: Family Medicine

## 2015-05-22 MED ORDER — SYNTHROID 125 MCG PO TABS: 125.0000 ug | ORAL_TABLET | Freq: Every day | ORAL | Status: DC

## 2015-05-26 ENCOUNTER — Ambulatory Visit (INDEPENDENT_AMBULATORY_CARE_PROVIDER_SITE_OTHER): Payer: BC Managed Care – PPO | Admitting: Family Medicine

## 2015-05-26 ENCOUNTER — Encounter: Payer: Self-pay | Admitting: *Deleted

## 2015-05-26 ENCOUNTER — Encounter: Payer: Self-pay | Admitting: Family Medicine

## 2015-05-26 VITALS — BP 118/84 | HR 102 | Temp 100.6°F | Wt 208.8 lb

## 2015-05-26 DIAGNOSIS — J029 Acute pharyngitis, unspecified: Secondary | ICD-10-CM | POA: Diagnosis not present

## 2015-05-26 DIAGNOSIS — J111 Influenza due to unidentified influenza virus with other respiratory manifestations: Secondary | ICD-10-CM

## 2015-05-26 LAB — POCT RAPID STREP A (OFFICE): Rapid Strep A Screen: NEGATIVE

## 2015-05-26 LAB — POC INFLUENZA A&B (BINAX/QUICKVUE)
INFLUENZA A, POC: NEGATIVE
INFLUENZA B, POC: POSITIVE — AB

## 2015-05-26 MED ORDER — OSELTAMIVIR PHOSPHATE 75 MG PO CAPS: 75.0000 mg | ORAL_CAPSULE | Freq: Two times a day (BID) | ORAL | Status: DC

## 2015-05-26 MED ORDER — GUAIFENESIN-CODEINE 100-10 MG/5ML PO SYRP: 5.0000 mL | ORAL_SOLUTION | Freq: Two times a day (BID) | ORAL | Status: DC | PRN

## 2015-05-26 NOTE — Assessment & Plan Note (Addendum)
Flu swab positive Treat with tamiflu given sxs worsening despite outside of window for optimal treatment. Further supportive care as per instructions. Discussed monitoring for sxs in family members.  Pt agrees with plan. Work excuse note written.

## 2015-05-26 NOTE — Progress Notes (Signed)
Pre visit review using our clinic review tool, if applicable. No additional management support is needed unless otherwise documented below in the visit note. 

## 2015-05-26 NOTE — Patient Instructions (Signed)
You have the flu. Treat with tamiflu.  Continue naprosyn and push fluids and rest.  Codeine cough syrup for night time.  Influenza, Adult Influenza ("the flu") is a viral infection of the respiratory tract. It occurs more often in winter months because people spend more time in close contact with one another. Influenza can make you feel very sick. Influenza easily spreads from person to person (contagious). CAUSES  Influenza is caused by a virus that infects the respiratory tract. You can catch the virus by breathing in droplets from an infected person's cough or sneeze. You can also catch the virus by touching something that was recently contaminated with the virus and then touching your mouth, nose, or eyes. RISKS AND COMPLICATIONS You may be at risk for a more severe case of influenza if you smoke cigarettes, have diabetes, have chronic heart disease (such as heart failure) or lung disease (such as asthma), or if you have a weakened immune system. Elderly people and pregnant women are also at risk for more serious infections. The most common problem of influenza is a lung infection (pneumonia). Sometimes, this problem can require emergency medical care and may be life threatening. SIGNS AND SYMPTOMS  Symptoms typically last 4 to 10 days and may include:  Fever.  Chills.  Headache, body aches, and muscle aches.  Sore throat.  Chest discomfort and cough.  Poor appetite.  Weakness or feeling tired.  Dizziness.  Nausea or vomiting. DIAGNOSIS  Diagnosis of influenza is often made based on your history and a physical exam. A nose or throat swab test can be done to confirm the diagnosis. TREATMENT  In mild cases, influenza goes away on its own. Treatment is directed at relieving symptoms. For more severe cases, your health care provider may prescribe antiviral medicines to shorten the sickness. Antibiotic medicines are not effective because the infection is caused by a virus, not by  bacteria. HOME CARE INSTRUCTIONS  Take medicines only as directed by your health care provider.  Use a cool mist humidifier to make breathing easier.  Get plenty of rest until your temperature returns to normal. This usually takes 3 to 4 days.  Drink enough fluid to keep your urine clear or pale yellow.  Cover yourmouth and nosewhen coughing or sneezing,and wash your handswellto prevent thevirusfrom spreading.  Stay homefromwork orschool untilthe fever is gonefor at least 721full day. PREVENTION  An annual influenza vaccination (flu shot) is the best way to avoid getting influenza. An annual flu shot is now routinely recommended for all adults in the [place] SEEK MEDICAL CARE IF:  You experiencechest pain, yourcough worsens,or you producemore mucus.  Youhave nausea,vomiting, ordiarrhea.  Your fever returns or gets worse. SEEK IMMEDIATE MEDICAL CARE IF:  You havetrouble breathing, you become short of breath,or your skin ornails becomebluish.  You have severe painor stiffnessin the neck.  You develop a sudden headache, or pain in the face or ear.  You have nausea or vomiting that you cannot control. MAKE SURE YOU:   Understand these instructions.  Will watch your condition.  Will get help right away if you are not doing well or get worse.   This information is not intended to replace advice given to you by your health care provider. Make sure you discuss any questions you have with your health care provider.   Document Released: 03/03/2000 Document Revised: 03/27/2014 Document Reviewed: 06/05/2011 Elsevier Interactive Patient Education [employer]2016 [employer]

## 2015-05-26 NOTE — Addendum Note (Signed)
Addended by: Josph MachoANCE, KIMBERLY A on: 05/26/2015 02:15 PM   Modules accepted: Orders, Medications

## 2015-05-26 NOTE — Addendum Note (Signed)
Addended by: Eustaquio BoydenGUTIERREZ, Manuelito Poage on: 05/26/2015 12:04 PM   Modules accepted: Orders

## 2015-05-26 NOTE — Progress Notes (Signed)
BP 118/84 mmHg  Pulse 102  Temp(Src) 100.6 F (38.1 C) (Oral)  Wt 208 lb 12 oz (94.688 kg)  SpO2 98%  LMP 05/04/2015   CC: flu like symptoms  Subjective:    Patient ID: Tamara Shaffer, female    DOB: 09-04-1975, 40 y.o.   MRN: [id number]  HPI: Tamara Shaffer is a 40 y.o. female presenting on 05/26/2015 for Influenza   Seen here 1 wk ago at that time feeling ok. 4d ago with cough, congestion, facial pressure, run down, fevers/chills, body aches, ST. Chest tightness as well. Tmax 102. Has been going to school.   + sick contacts at home - flu, strep, stomach flu, etc.   No h/o asthma.  + seasonal allergic rhinitis.   Relevant past medical, surgical, family and social history reviewed and updated as indicated. Interim medical history since our last visit reviewed. Allergies and medications reviewed and updated. Current Outpatient Prescriptions on File Prior to Visit  Medication Sig  . hydrochlorothiazide (HYDRODIURIL) 25 MG tablet Take 1 tablet (25 mg total) by mouth daily.  Marland Kitchen loratadine (CLARITIN) 10 MG tablet Take 10 mg by mouth daily.  . Multiple Vitamin (MULTIVITAMIN) tablet Take 1 tablet by mouth daily.    . Multiple Vitamins-Minerals (AIRBORNE PO) Take 1 tablet by mouth daily.  . naproxen (NAPROSYN) 500 MG tablet Take one po bid x 5 days then prn pain, take with food  . omeprazole (PRILOSEC) 20 MG capsule Take 1 capsule (20 mg total) by mouth daily.  . SUMAtriptan (IMITREX) 100 MG tablet Take 1 tablet (100 mg total) by mouth once as needed for migraine.  Marland Kitchen SYNTHROID 125 MCG tablet Take 1 tablet (125 mcg total) by mouth daily before breakfast.  . Topiramate ER (TROKENDI XR) 50 MG CP24 Take 50 mg by mouth at bedtime.  Marland Kitchen venlafaxine XR (EFFEXOR-XR) 75 MG 24 hr capsule Take 1 capsule (75 mg total) by mouth 3 (three) times daily.  . vitamin C (ASCORBIC ACID) 500 MG tablet Take 500 mg by mouth daily.   No current facility-administered medications on file prior to visit.     Review of Systems Per HPI unless specifically indicated in ROS section     Objective:    BP 118/84 mmHg  Pulse 102  Temp(Src) 100.6 F (38.1 C) (Oral)  Wt 208 lb 12 oz (94.688 kg)  SpO2 98%  LMP 05/04/2015  Wt Readings from Last 3 Encounters:  05/26/15 208 lb 12 oz (94.688 kg)  05/19/15 207 lb 8 oz (94.121 kg)  01/07/15 201 lb 12 oz (91.513 kg)    Physical Exam  Constitutional: She appears well-developed and well-nourished. No distress.  Tired appearing  HENT:  Head: Normocephalic and atraumatic.  Right Ear: Hearing, tympanic membrane, external ear and ear canal normal.  Left Ear: Hearing, tympanic membrane, external ear and ear canal normal.  Nose: Mucosal edema present. No rhinorrhea. Right sinus exhibits maxillary sinus tenderness and frontal sinus tenderness. Left sinus exhibits maxillary sinus tenderness and frontal sinus tenderness.  Mouth/Throat: Uvula is midline and mucous membranes are normal. Posterior oropharyngeal edema and posterior oropharyngeal erythema present. No oropharyngeal exudate or tonsillar abscesses.  Slight TM injection Nasal mucosal erythema  Eyes: Conjunctivae and EOM are normal. Pupils are equal, round, and reactive to light. No scleral icterus.  Neck: Normal range of motion. Neck supple.  Cardiovascular: Normal rate, regular rhythm, normal heart sounds and intact distal pulses.   No murmur heard. Pulmonary/Chest: Effort normal and breath sounds normal.  No respiratory distress. She has no wheezes. She has no rales.  Lymphadenopathy:    She has cervical adenopathy (R sided AC chain).  Skin: Skin is warm and dry. No rash noted.  Nursing note and vitals reviewed.  Results for orders placed or performed in visit on 05/26/15  POCT rapid strep A  Result Value Ref Range   Rapid Strep A Screen Negative Negative  POC Influenza A&B (Binax test)  Result Value Ref Range   Influenza A, POC Negative Negative   Influenza B, POC Positive (A) Negative       Assessment & Plan:   Problem List Items Addressed This Visit    Influenza - Primary    Flu swab positive Treat with tamiflu given sxs worsening despite outside of window for optimal treatment. Further supportive care as per instructions. Discussed monitoring for sxs in family members.  Pt agrees with plan. Work excuse note written.      Relevant Orders   POC Influenza A&B (Binax test) (Completed)    Other Visit Diagnoses    Sore throat        Relevant Orders    POCT rapid strep A (Completed)        Follow up plan: Return if symptoms worsen or fail to improve.

## 2015-05-28 ENCOUNTER — Ambulatory Visit (INDEPENDENT_AMBULATORY_CARE_PROVIDER_SITE_OTHER): Payer: BC Managed Care – PPO | Admitting: Adult Health

## 2015-05-28 ENCOUNTER — Encounter: Payer: Self-pay | Admitting: Adult Health

## 2015-05-28 ENCOUNTER — Telehealth: Payer: Self-pay | Admitting: Family Medicine

## 2015-05-28 VITALS — BP 110/80 | HR 121 | Temp 99.8°F | Ht 63.0 in | Wt 203.7 lb

## 2015-05-28 DIAGNOSIS — R0602 Shortness of breath: Secondary | ICD-10-CM

## 2015-05-28 MED ORDER — IPRATROPIUM-ALBUTEROL 0.5-2.5 (3) MG/3ML IN SOLN
3.0000 mL | Freq: Once | RESPIRATORY_TRACT | Status: AC
  Administered 2015-05-28: 3 mL via RESPIRATORY_TRACT

## 2015-05-28 MED ORDER — MAGIC MOUTHWASH W/LIDOCAINE: 5.0000 mL | Freq: Three times a day (TID) | ORAL | Status: DC | PRN

## 2015-05-28 MED ORDER — PREDNISONE 10 MG PO TABS: 10.0000 mg | ORAL_TABLET | Freq: Every day | ORAL | Status: DC

## 2015-05-28 NOTE — Patient Instructions (Signed)
It was great meeting you today!  I have sent in a prescription for Prednisone, take it as directed  40 mg x 3 days 20 mg x 3 days 10 mg x 3 days  Use the magic mouthwash as directed- gargle for 15 seconds and spit.   Rest your voice.  Drink warm liquids with honey   Follow up with PCP if needed

## 2015-05-28 NOTE — Progress Notes (Signed)
Pre visit review using our clinic review tool, if applicable. No additional management support is needed unless otherwise documented below in the visit note. 

## 2015-05-28 NOTE — Telephone Encounter (Signed)
Patient Name: Tamara HerringCHANTEL Eckersley DOB: 06/12/1975 Initial Comment caller states she is coughing up mucus and blood, difficulty swallowing, glands are swollen Nurse Assessment Nurse: Elijah Birkaldwell, RN, Stark BrayLynda Date/Time (Eastern Time): 05/28/2015 9:14:42 AM Confirm and document reason for call. If symptomatic, describe symptoms. You must click the next button to save text entered. ---Caller states she is coughing up mucus - green & yellow and small amt. of blood. Having difficulty swallowing, glands are swollen. Symptoms started Sunday. Low-grade fever Tuesday with body aches. Seen on Wednesday. Tested positive for flu, negative for strep. The cough is still there, even with the cough rx prescribed. tightness of chest. Still has fever. Taking Tylenol. Has the patient traveled out of the country within the last 30 days? ---Not Applicable Does the patient have any new or worsening symptoms? ---Yes Will a triage be completed? ---Yes Related visit to physician within the last 2 weeks? ---Yes Does the PT have any chronic conditions? (i.e. diabetes, asthma, etc.) ---Yes List chronic conditions. ---thyroid rx, anxiety, heart burn, depression Is the patient pregnant or possibly pregnant? (Ask all females between the ages of 5812-55) ---No Is this a behavioral health or substance abuse call? ---No Guidelines Guideline Title Affirmed Question Affirmed Notes Lymph Nodes Swollen Fever present > 3 days (72 hours) Final Disposition User See Physician within 24 Hours Hollidayaldwell, RN, Hilton HotelsLynda Comments Caller states it feels like shards of glass in her throat when she tried to swallow. It is very painful & she must intentionally think through the process of swallowing as her glands are swollen also. [facility], or [facility], so scheduled with NP at [facility]. Referrals REFERRED TO PCP OFFICE Disagree/Comply: Comply

## 2015-05-28 NOTE — Telephone Encounter (Signed)
Appoint[facility] 1115 [facility] with Shirline Freesory Nafziger, NP.

## 2015-05-28 NOTE — Telephone Encounter (Signed)
I'm glad she was seen today.  Treated with alb/atrovent neb and prednisone course prescribed. Can we call on Monday for f/u sxs? Thanks.

## 2015-05-28 NOTE — Progress Notes (Signed)
Subjective:    Patient ID: Tamara Shaffer, female    DOB: 07/19/1975, 40 y.o.   MRN: [id number]  HPI  40 year old female, patient of Dr. Sharen HonesGutierrez. She was recently seen by him on 05/26/2015 and diagnosed with the flu. She also had a rapid strep done which was negative. She reports that she continues to have severe throat pain that is worse when she is trying to swallow her medications, she reports " I am to the point of tears when I have to take my medication because the pain is so bad.   She also reports that over the past few days she has had increasing shortness of breath, with wheezing.    Review of Systems  Constitutional: Positive for activity change and fatigue.  HENT: Positive for congestion, rhinorrhea, sinus pressure and sore throat. Negative for tinnitus.   Respiratory: Positive for cough, chest tightness, shortness of breath and wheezing.   Cardiovascular: Negative.   Neurological: Negative.   All other systems reviewed and are negative.  Past Medical History  Diagnosis Date  . GDM (gestational diabetes mellitus)   . Hypothyroidism     medically treated  . 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 Insomnia   . GERD (gastroesophageal reflux disease)   . Allergic rhinitis     seasonal  . Prediabetes     A1c 5.8% 11/2010  . Vulvar burning   . Urinary incontinence   . Obesity     Social History   Social History  . Marital Status: Married    Spouse Name: N/A  . Number of Children: 2  . Years of Education: N/A   Occupational History  . Teaches school, 2nd grade    Social History Main Topics  . Smoking status: Never Smoker   . Smokeless tobacco: Never Used  . Alcohol Use: Yes     Comment: Socially  . Drug Use: No  . Sexual Activity:    Partners: Male    Birth Control/ Protection: Condom   Other Topics Concern  . Not on file   Social History Narrative   Caffeine:  1 cup daily    Past Surgical History   Procedure Laterality Date  . Cholecystectomy    . Breast surgery  06/1994    Reconstruction  . Ectopic pregnancy surgery  10/2008    Tube remained  . Thyroid us  06/2010    heterogeneous normal sized thyroid gland consistent with chronic thyroiditis    Family History  Problem Relation Age of Onset  . Hypertension Father   . Heart disease Father     Porcine Valve Replacement  . Cancer Maternal Grandmother     Ovarian cancer  . Diabetes Neg Hx   . Stroke Neg Hx   . Cancer Cousin     Thyroid cancer  . Cancer Mother     leiomyosarcoma of abdomen    Allergies  Allergen Reactions  . Erythromycin     REACTION: GI upset, rash, SOB  . Penicillins     REACTION: as child, ok with amoxicillin in past    Current Outpatient Prescriptions on File Prior to Visit  Medication Sig Dispense Refill  . guaiFENesin-codeine (CHERATUSSIN AC) 100-10 MG/5ML syrup Take 5 mLs by mouth 2 (two) times daily as needed. 140 mL 0  . hydrochlorothiazide (HYDRODIURIL) 25 MG tablet Take 1 tablet (25 mg total) by mouth daily. 90 tablet 3  .  loratadine (CLARITIN) 10 MG tablet Take 10 mg by mouth daily.    . Multiple Vitamin (MULTIVITAMIN) tablet Take 1 tablet by mouth daily.      . Multiple Vitamins-Minerals (AIRBORNE PO) Take 1 tablet by mouth daily.    . naproxen (NAPROSYN) 500 MG tablet Take one po bid x 5 days then prn pain, take with food 30 tablet 0  . omeprazole (PRILOSEC) 20 MG capsule Take 1 capsule (20 mg total) by mouth daily. 90 capsule 0  . oseltamivir (TAMIFLU) 75 MG capsule Take 1 capsule (75 mg total) by mouth 2 (two) times daily. 10 capsule 0  . SUMAtriptan (IMITREX) 100 MG tablet Take 1 tablet (100 mg total) by mouth once as needed for migraine. 27 tablet 4  . SYNTHROID 125 MCG tablet Take 1 tablet (125 mcg total) by mouth daily before breakfast. 90 tablet 3  . Topiramate ER (TROKENDI XR) 50 MG CP24 Take 50 mg by mouth at bedtime. 90 capsule 4  . venlafaxine XR (EFFEXOR-XR) 75 MG 24 hr  capsule Take 1 capsule (75 mg total) by mouth 3 (three) times daily. 180 capsule 2  . vitamin C (ASCORBIC ACID) 500 MG tablet Take 500 mg by mouth daily.     No current facility-administered medications on file prior to visit.    BP 110/80 mmHg  Pulse 121  Temp(Src) 99.8 F (37.7 C) (Oral)  Ht  (1.6 m)  Wt 203 lb 11.2 oz (92.398 kg)  BMI 36.09 kg/m2  SpO2 97%  LMP 05/04/2015       Objective:   Physical Exam  Constitutional: She is oriented to person, place, and time. She appears well-developed and well-nourished. No distress.  Neck: Normal range of motion. Neck supple. Thyromegaly present.  Cardiovascular: Normal rate, regular rhythm, normal heart sounds and intact distal pulses.  Exam reveals no gallop and no friction rub.   No murmur heard. Pulmonary/Chest: Effort normal. No respiratory distress. She has wheezes in the right upper field, the right middle field, the right lower field, the left upper field, the left middle field and the left lower field. She has no rhonchi. She has no rales. She exhibits no tenderness.  Audible wheezing while talking  Musculoskeletal: Normal range of motion. She exhibits no edema or tenderness.  Neurological: She is alert and oriented to person, place, and time.  Skin: Skin is warm and dry. No rash noted. She is not diaphoretic. No erythema. No pallor.  Psychiatric: She has a normal mood and affect. Her behavior is normal. Judgment and thought content normal.  Nursing note and vitals reviewed.     Assessment & Plan:  1. SOB (shortness of breath) - Asthma symptoms complicated by recent influenza - ipratropium-albuterol (DUONEB) 0.5-2.5 (3) MG/3ML nebulizer solution 3 mL; Take 3 mLs by nebulization once. - predniSONE (DELTASONE) 10 MG tablet; Take 1 tablet (10 mg total) by mouth daily with breakfast. 40 mg x 3 days, 20 mg x 3 days, 10 mg x 3 days  Dispense: 21 tablet; Refill: 0 - magic mouthwash w/lidocaine SOLN; Take 5 mLs by mouth 3  (three) times daily as needed for mouth pain.  Dispense: 21 mL; Refill: 0

## 2015-05-31 ENCOUNTER — Ambulatory Visit: Payer: BC Managed Care – PPO

## 2015-05-31 ENCOUNTER — Telehealth: Payer: Self-pay | Admitting: Family Medicine

## 2015-05-31 ENCOUNTER — Other Ambulatory Visit: Payer: Self-pay | Admitting: Family Medicine

## 2015-05-31 NOTE — Telephone Encounter (Signed)
Pt has appt 06/01/15 at 10:45 with Mayra ReelKate Clark NP.

## 2015-05-31 NOTE — Telephone Encounter (Signed)
[facility] - Client TELEPHONE ADVICE RECORD [facility]  Patient Name: Tamara Shaffer  DOB: 10/17/1975    Initial Comment Caller states she was seen last week and was diagnosed with the flu. She is now feeling even worse and has the beginnings of pneumonia. Still has a low grade fever of 100. Her sinus cavity is now stuffed up and is afraid she may be getting a sinus infection.    Nurse Assessment  Nurse: [name], RN, [name] Date/Time (Eastern Time): 05/31/2015 2:30:15 PM  Confirm and document reason for call. If symptomatic, describe symptoms. You must click the next button to save text entered. ---Caller states she was diagnosed with flu last week. She had the beginning stages of pneumonia last week. She states she is still running fevers of 100. She states her nasal passages are full. She is taking Prednisone. Not on any antibiotics right now. She is taking nebulizer treatments  Has the patient traveled out of the country within the last 30 days? ---Not Applicable  Does the patient have any new or worsening symptoms? ---Yes  Will a triage be completed? ---Yes  Related visit to physician within the last 2 weeks? ---No  Does the PT have any chronic conditions? (i.e. diabetes, asthma, etc.) ---No  Is the patient pregnant or possibly pregnant? (Ask all females between the ages of 4012-55) ---No  Is this a behavioral health or substance abuse call? ---No     Guidelines    Guideline Title Affirmed Question Affirmed Notes  Influenza Follow-up Call Fever present > 3 days (72 hours)    Final Disposition User   See Physician within 24 Hours [place] NorthPatten, RN, [name]    Comments  Caller scheduled with [name], NP at 1045 on 03/14   Referrals  REFERRED TO PCP OFFICE   Disagree/Comply: Comply

## 2015-05-31 NOTE — Telephone Encounter (Signed)
Spoke with patient. She still has a low grade temp, cough and lots of sinus pressure and drainage. She has an appt with Jae DireKate tomorrow for re-check.

## 2015-05-31 NOTE — Telephone Encounter (Signed)
Noted  

## 2015-06-01 ENCOUNTER — Ambulatory Visit (INDEPENDENT_AMBULATORY_CARE_PROVIDER_SITE_OTHER): Payer: BC Managed Care – PPO | Admitting: Primary Care

## 2015-06-01 ENCOUNTER — Encounter: Payer: Self-pay | Admitting: Primary Care

## 2015-06-01 VITALS — BP 120/82 | HR 82 | Temp 98.6°F | Ht 63.0 in | Wt 200.8 lb

## 2015-06-01 DIAGNOSIS — J3489 Other specified disorders of nose and nasal sinuses: Secondary | ICD-10-CM | POA: Diagnosis not present

## 2015-06-01 NOTE — Progress Notes (Signed)
Subjective:    Patient ID: Tamara Shaffer, female    DOB: March 09, 1976, 40 y.o.   MRN: [id number]  HPI  Tamara Shaffer is a 40 year old female who presents today with a chief complaint of cough. She also reports shortness of breath, sore throat, nasal congestion. Her sinus pressure began Sunday night and is blowing yellow mucous from her nasal cavity. Today she would like re-evaluation and clearance to return to work.   She was diagnosed with the flu on 05/26/2015 and provided with a Tamiflu course. She was then evaluated at [facility] on 05/28/15 with complaints of sore throat, shortness of breath, and wheezing. She was provided with a Duoneb treatment in the office and prescriptions for prednisone taper and magic mouthwash. She tested negative for strep.  Since her visit on the 10 th she has noticed an improvement in her sore throat. Over this past weekend she developed sinus pressure with productive cough. She's taking Mucinex and using the Cheratussin AC cough medication provided by her PCP. She doesn't feel as though she can return to work as she's still fatigued, has sinus pressure, and headaches. She teaches kindergarten. Her last fever was yesterday. No fevers today. She's continued to take her prednisone. Overall she's feeling improved.   Review of Systems  Constitutional: Positive for fatigue. Negative for fever.  HENT: Positive for congestion, sinus pressure and sore throat.   Respiratory: Positive for cough and shortness of breath.   Cardiovascular: Negative for chest pain.  Gastrointestinal: Negative for nausea and vomiting.  Musculoskeletal: Negative for myalgias.       Past Medical History  Diagnosis Date  . GDM (gestational diabetes mellitus)   . Hypothyroidism     medically treated  . 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 Insomnia   . GERD (gastroesophageal reflux disease)   . Allergic rhinitis     seasonal  .  Prediabetes     A1c 5.8% 11/2010  . Vulvar burning   . Urinary incontinence   . Obesity     Social History   Social History  . Marital Status: Married    Spouse Name: N/A  . Number of Children: 2  . Years of Education: N/A   Occupational History  . Teaches school, 2nd grade    Social History Main Topics  . Smoking status: Never Smoker   . Smokeless tobacco: Never Used  . Alcohol Use: Yes     Comment: Socially  . Drug Use: No  . Sexual Activity:    Partners: Male    Birth Control/ Protection: Condom   Other Topics Concern  . Not on file   Social History Narrative   Caffeine:  1 cup daily    Past Surgical History  Procedure Laterality Date  . Cholecystectomy    . Breast surgery  06/1994    Reconstruction  . Ectopic pregnancy surgery  10/2008    Tube remained  . Thyroid US  06/2010    heterogeneous normal sized thyroid gland consistent with chronic thyroiditis    Family History  Problem Relation Age of Onset  . Hypertension Father   . Heart disease Father     Porcine Valve Replacement  . Cancer Maternal Grandmother     Ovarian cancer  . Diabetes Neg Hx   . Stroke Neg Hx   . Cancer Cousin     Thyroid cancer  . Cancer Mother  leiomyosarcoma of abdomen    Allergies  Allergen Reactions  . Erythromycin     REACTION: GI upset, rash, SOB  . Penicillins     REACTION: as child, ok with amoxicillin in past    Current Outpatient Prescriptions on File Prior to Visit  Medication Sig Dispense Refill  . hydrochlorothiazide (HYDRODIURIL) 25 MG tablet Take 1 tablet (25 mg total) by mouth daily. 90 tablet 3  . loratadine (CLARITIN) 10 MG tablet Take 10 mg by mouth daily.    . magic mouthwash w/lidocaine SOLN Take 5 mLs by mouth 3 (three) times daily as needed for mouth pain. 180 mL 0  . Multiple Vitamin (MULTIVITAMIN) tablet Take 1 tablet by mouth daily.      . Multiple Vitamins-Minerals (AIRBORNE PO) Take 1 tablet by mouth daily.    . naproxen (NAPROSYN) 500  MG tablet Take one po bid x 5 days then prn pain, take with food 30 tablet 0  . omeprazole (PRILOSEC) 20 MG capsule TAKE 1 CAPSULE DAILY 90 capsule 3  . predniSONE (DELTASONE) 10 MG tablet Take 1 tablet (10 mg total) by mouth daily with breakfast. 40 mg x 3 days, 20 mg x 3 days, 10 mg x 3 days 21 tablet 0  . SUMAtriptan (IMITREX) 100 MG tablet Take 1 tablet (100 mg total) by mouth once as needed for migraine. 27 tablet 4  . SYNTHROID 125 MCG tablet Take 1 tablet (125 mcg total) by mouth daily before breakfast. 90 tablet 3  . SYNTHROID 125 MCG tablet Take 1 tablet (125 mcg total) by mouth daily before breakfast. 90 tablet 3  . Topiramate ER (TROKENDI XR) 50 MG CP24 Take 50 mg by mouth at bedtime. 90 capsule 4  . venlafaxine XR (EFFEXOR-XR) 75 MG 24 hr capsule Take 1 capsule (75 mg total) by mouth 3 (three) times daily. 180 capsule 2  . vitamin C (ASCORBIC ACID) 500 MG tablet Take 500 mg by mouth daily.     No current facility-administered medications on file prior to visit.    BP 120/82 mmHg  Pulse 82  Temp(Src) 98.6 F (37 C) (Oral)  Ht 5\' 3"  (1.6 m)  Wt 200 lb 12.8 oz (91.082 kg)  BMI 35.58 kg/m2  SpO2 98%  LMP 05/04/2015    Objective:   Physical Exam  Constitutional: She appears well-nourished.  HENT:  Right Ear: Tympanic membrane and ear canal normal.  Left Ear: Tympanic membrane and ear canal normal.  Nose: Mucosal edema present. Right sinus exhibits maxillary sinus tenderness. Right sinus exhibits no frontal sinus tenderness. Left sinus exhibits maxillary sinus tenderness. Left sinus exhibits no frontal sinus tenderness.  Mouth/Throat: Posterior oropharyngeal erythema present. No oropharyngeal exudate or posterior oropharyngeal edema.  Eyes: Conjunctivae are normal.  Neck: Neck supple.  Cardiovascular: Normal rate and regular rhythm.   Pulmonary/Chest: Effort normal. She has wheezes in the right upper field and the left upper field.  Lymphadenopathy:    She has no cervical  adenopathy.  Skin: Skin is warm and dry.          Assessment & Plan:  Sinus Pressure:  Diagnosed with flu on 05/26/15 and provided with Tamiflu course. Treated on 05/28/15 for sore throat and SOB with duoneb, prednisone, magic mouthwash. Overall feeling improved, does have sinus pressure and headaches for 2 days. Exam mostly unremarkable. Mild wheezing to upper lobes, otherwise clear. Mild erythema to posterior pharynx. Does not appear to have bacterial involvement. Discussed that symptoms may linger for several more days  and that Tamiflu was not a cure.  Will allow her to return to work Thursday if she's feeling well. Continue prednisone, Cheratussin. Start Flonase and ibuprofen PRN for headaches and sinus pressure. Return precautions provdied.

## 2015-06-01 NOTE — Patient Instructions (Signed)
Continue prednisone for wheezing and shortness of breath. Your lungs sounds good overall.  Continue Magic Mouthwash as needed for sore throat. You may also take ibuprofen 600 mg three times daily as needed. Warm salt gargles four times daily will also provide relief.  Continue Mucinex for any chest congestion.  You may take the Tessalon Pearls three times daily as needed for daytime cough.  Ensure you are staying hydrated with water daily.   Please call us if no improvement or if symptoms become worse. You may return to work on Thursday this week.  It was a pleasure meeting you!

## 2015-06-01 NOTE — Progress Notes (Signed)
Pre visit review using our clinic review tool, if applicable. No additional management support is needed unless otherwise documented below in the visit note. 

## 2015-06-08 ENCOUNTER — Ambulatory Visit: Payer: BC Managed Care – PPO | Admitting: Obstetrics & Gynecology

## 2015-06-09 ENCOUNTER — Encounter: Payer: Self-pay | Admitting: Family Medicine

## 2015-06-09 ENCOUNTER — Ambulatory Visit
Admission: RE | Admit: 2015-06-09 | Discharge: 2015-06-09 | Disposition: A | Discharge status: Home / Self Care | Payer: BC Managed Care – PPO | Source: Ambulatory Visit | Attending: Family Medicine | Admitting: Family Medicine

## 2015-06-09 DIAGNOSIS — E039 Hypothyroidism, unspecified: Secondary | ICD-10-CM

## 2015-06-09 DIAGNOSIS — E010 Iodine-deficiency related diffuse (endemic) goiter: Secondary | ICD-10-CM

## 2015-06-10 ENCOUNTER — Encounter: Payer: Self-pay | Admitting: *Deleted

## 2015-06-22 ENCOUNTER — Ambulatory Visit: Payer: BC Managed Care – PPO | Admitting: Obstetrics and Gynecology

## 2015-07-01 ENCOUNTER — Telehealth: Payer: Self-pay | Admitting: Family Medicine

## 2015-07-01 NOTE — Telephone Encounter (Signed)
Pt is out shopping now but will ck with mychart and possibly do E visit or will go to [facility].

## 2015-07-01 NOTE — Telephone Encounter (Signed)
Agree with E visit or [facility].

## 2015-07-01 NOTE — Telephone Encounter (Signed)
[facility] Day - Client TELEPHONE ADVICE RECORD [facility] Patient Name: Tamara Shaffer DOB: 02/27/1976 Initial Comment Caller states she woke up with a fever this morning of 102 and her throat on fire. All her family has had strep throat. Nurse Assessment Nurse: Josie SaundersGerard, RN, Erskine SquibbJane Date/Time [name](Eastern Time): 07/01/2015 4:44:56 PM Confirm and document reason for call. If symptomatic, describe symptoms. You must click the next button to save text entered. ---Caller reports she has a sore throat, fever and exposed to family members with strep. Taking Advil with relief Has the patient traveled out of the country within the last 30 days? ---No Does the patient have any new or worsening symptoms? ---Yes Will a triage be completed? ---Yes Related visit to physician within the last 2 weeks? ---No Does the PT have any chronic conditions? (i.e. diabetes, asthma, etc.) ---No Is the patient pregnant or possibly pregnant? (Ask all females between the ages of 7212-55) ---No Is this a behavioral health or substance abuse call? ---No Guidelines Guideline Title Affirmed Question Affirmed Notes Sore Throat SEVERE (e.g., excruciating) throat pain Final Disposition User See Physician within 24 Hours Josie SaundersGerard, RN, Erskine SquibbJane Comments Primary Number: Female answered and states he is out chopping now Disagree/Comply: Comply

## 2015-07-14 ENCOUNTER — Ambulatory Visit (INDEPENDENT_AMBULATORY_CARE_PROVIDER_SITE_OTHER): Payer: BC Managed Care – PPO | Admitting: Family Medicine

## 2015-07-14 ENCOUNTER — Encounter: Payer: Self-pay | Admitting: Family Medicine

## 2015-07-14 DIAGNOSIS — B9789 Other viral agents as the cause of diseases classified elsewhere: Secondary | ICD-10-CM

## 2015-07-14 DIAGNOSIS — J069 Acute upper respiratory infection, unspecified: Secondary | ICD-10-CM | POA: Insufficient documentation

## 2015-07-14 DIAGNOSIS — B349 Viral infection, unspecified: Secondary | ICD-10-CM

## 2015-07-14 DIAGNOSIS — J988 Other specified respiratory disorders: Principal | ICD-10-CM

## 2015-07-14 MED ORDER — GUAIFENESIN-CODEINE 100-10 MG/5ML PO SYRP: 5.0000 mL | ORAL_SOLUTION | Freq: Three times a day (TID) | ORAL | Status: DC | PRN

## 2015-07-14 NOTE — Patient Instructions (Signed)
This is viral/post infectious.  Continue what you're doing.  Give it some time.  Use the cough medication as needed.  Call with concerns or if you fail to improve or worsen.  Take care  Dr. Adriana Simasook

## 2015-07-14 NOTE — Progress Notes (Signed)
Pre visit review using our clinic review tool, if applicable. No additional management support is needed unless otherwise documented below in the visit note. 

## 2015-07-14 NOTE — Assessment & Plan Note (Signed)
New acute problem. Exam unremarkable. Viral/post infectious in etiology. Treating with OTC antihistamine, rest/fluids, PRN Cheratussin.

## 2015-07-14 NOTE — Progress Notes (Signed)
Subjective:  Patient ID: Tamara Shaffer, female    DOB: 12/26/1975  Age: 40 y.o. MRN: [id number]  CC: Sore throat, ear pressure/fluid, hoarseness, cough  HPI:  40 year old female presents with the above complaints.  Patient was sick with influenza. She was later treated with prednisone for an asthma exacerbation. She reports that on 3/24 she was seen in a local urgent care was diagnosed with a sinus infection and an ear infection. She was treated with Biaxin.  Patient states that since that time she's continued to have symptoms. She's been experiencing ear pain/pressure/fluid behind the ears. She has most recently developed sore throat (started Sunday). She also has chest tightness, cough, and hoarseness. She been taking over-the-counter medications without resolution. She states that she's had a low-grade temperature but no fever. No known exacerbating factors. No other complaints this time.  Social Hx   Social History   Social History  . Marital Status: Married    Spouse Name: N/A  . Number of Children: 2  . Years of Education: N/A   Occupational History  . Teaches school, 2nd grade    Social History Main Topics  . Smoking status: Never Smoker   . Smokeless tobacco: Never Used  . Alcohol Use: Yes     Comment: Socially  . Drug Use: No  . Sexual Activity:    Partners: Male    Birth Control/ Protection: Condom   Other Topics Concern  . None   Social History Narrative   Caffeine:  1 cup daily   Review of Systems  Constitutional: Positive for fatigue. Negative for fever.  HENT: Positive for sore throat and voice change.   Respiratory: Positive for cough and chest tightness.    Objective:  BP 120/82 mmHg  Pulse 96  Temp(Src) 99.2 F (37.3 C) (Oral)  Wt 218 lb 8 oz (99.111 kg)  SpO2 97%  BP/Weight 07/14/2015 06/01/2015 05/28/2015  Systolic BP 120 120 110  Diastolic BP 82 82 80  Wt. (Lbs) 218.5 200.8 203.7  BMI 38.72 35.58 36.09   Physical Exam  Constitutional:  She is oriented to person, place, and time. She appears well-developed. No distress.  HENT:  Oropharynx with mild erythema. Tonsillar hypertrophy noted. No exudate. Normal TMs bilaterally. No effusion. No evidence of infection.  Eyes: Conjunctivae are normal.  Cardiovascular: Normal rate and regular rhythm.   Pulmonary/Chest: Effort normal and breath sounds normal. She has no wheezes. She has no rales.  Lymphadenopathy:    She has cervical adenopathy.  Neurological: She is alert and oriented to person, place, and time.  Psychiatric: She has a normal mood and affect.  Vitals reviewed.  Lab Results  Component Value Date   WBC 11.3* 05/19/2015   HGB 12.5 11/06/2013   HCT 38.0 05/19/2015   PLT 281 05/19/2015   GLUCOSE 88 05/19/2015   CHOL 169 11/06/2013   TRIG 133 11/06/2013   HDL 49 11/06/2013   LDLCALC 93 11/06/2013   ALT 16 11/06/2013   AST 12 11/06/2013   NA 140 05/19/2015   K 4.0 05/19/2015   CL 100 05/19/2015   CREATININE 0.87 05/19/2015   BUN 13 05/19/2015   CO2 24 05/19/2015   TSH 1.660 05/19/2015   HGBA1C 5.4 11/06/2013   Assessment & Plan:   Problem List Items Addressed This Visit    Viral respiratory infection    New acute problem. Exam unremarkable. Viral/post infectious in etiology. Treating with OTC antihistamine, rest/fluids, PRN Cheratussin.  Relevant Medications   guaiFENesin-codeine (ROBITUSSIN AC) 100-10 MG/5ML syrup   Other Relevant Orders   POCT rapid strep A   Culture, Group A Strep     Meds ordered this encounter  Medications  . guaiFENesin-codeine (ROBITUSSIN AC) 100-10 MG/5ML syrup    Sig: Take 5 mLs by mouth 3 (three) times daily as needed for cough.    Dispense:  120 mL    Refill:  0   Follow-up: PRN  Everlene OtherJayce Fadumo Heng DO [facility]

## 2015-07-16 LAB — CULTURE, GROUP A STREP

## 2015-07-16 MED ORDER — AMOXICILLIN 500 MG PO CAPS: 500.0000 mg | ORAL_CAPSULE | Freq: Two times a day (BID) | ORAL | Status: DC

## 2015-07-16 NOTE — Addendum Note (Signed)
Addended by: Jennelle HumanNEWTON, ASHLEIGH E on: 07/16/2015 08:37 AM   Modules accepted: Orders

## 2015-07-19 ENCOUNTER — Telehealth: Payer: Self-pay

## 2015-07-19 NOTE — Telephone Encounter (Signed)
Patient had requested a call when June 2017 schedule became available so she could reschedule a cancelled Annual Exam appointment. Called patient, no answer, left voicemail instructing her to return my call here at the office.

## 2015-08-30 ENCOUNTER — Ambulatory Visit: Payer: BC Managed Care – PPO | Admitting: Obstetrics & Gynecology

## 2015-09-06 ENCOUNTER — Ambulatory Visit: Payer: BC Managed Care – PPO | Admitting: Obstetrics & Gynecology

## 2015-09-29 ENCOUNTER — Ambulatory Visit: Payer: BC Managed Care – PPO | Admitting: Obstetrics & Gynecology

## 2015-10-04 ENCOUNTER — Ambulatory Visit (INDEPENDENT_AMBULATORY_CARE_PROVIDER_SITE_OTHER): Payer: BC Managed Care – PPO | Admitting: Obstetrics & Gynecology

## 2015-10-04 ENCOUNTER — Encounter: Payer: Self-pay | Admitting: Obstetrics & Gynecology

## 2015-10-04 VITALS — BP 125/88 | HR 86 | Resp 18 | Ht 63.0 in | Wt 216.0 lb

## 2015-10-04 DIAGNOSIS — F32A Depression, unspecified: Secondary | ICD-10-CM

## 2015-10-04 DIAGNOSIS — Z01419 Encounter for gynecological examination (general) (routine) without abnormal findings: Secondary | ICD-10-CM

## 2015-10-04 DIAGNOSIS — Z1151 Encounter for screening for human papillomavirus (HPV): Secondary | ICD-10-CM

## 2015-10-04 DIAGNOSIS — Z124 Encounter for screening for malignant neoplasm of cervix: Secondary | ICD-10-CM

## 2015-10-04 DIAGNOSIS — F329 Major depressive disorder, single episode, unspecified: Secondary | ICD-10-CM | POA: Diagnosis not present

## 2015-10-04 DIAGNOSIS — N938 Other specified abnormal uterine and vaginal bleeding: Secondary | ICD-10-CM

## 2015-10-04 MED ORDER — MISOPROSTOL 200 MCG PO TABS: ORAL_TABLET | ORAL | Status: DC

## 2015-10-04 MED ORDER — VENLAFAXINE HCL ER 75 MG PO CP24: 75.0000 mg | ORAL_CAPSULE | Freq: Three times a day (TID) | ORAL | Status: DC

## 2015-10-04 NOTE — Progress Notes (Signed)
Subjective:    Tamara Shaffer is a 40 y.o. MW P2( 6 and 39 yo kids) female who presents for an annual exam. The patient has no complaints today. The patient is sexually active. GYN screening history: last pap: was normal. The patient wears seatbelts: no. The patient participates in regular exercise: yes. Has the patient ever been transfused or tattooed?: no. The patient reports that there is not domestic violence in her life.   Menstrual History: OB History    Gravida Para Term Preterm AB TAB SAB Ectopic Multiple Living   2 2        2       Menarche age: [age]  Patient's last menstrual period was 09/28/2015.    The following portions of the patient's history were reviewed and updated as appropriate: allergies, current medications, past family history, past medical history, past social history, past surgical history and problem list.  Review of Systems Pertinent items are noted in HPI.  Married for 13 years. Uses condoms. Periods are irregular for the last 7 months.   Objective:    BP 125/88 mmHg  Pulse 86  Resp 18  Ht 5\' 3"  (1.6 m)  Wt 216 lb (97.977 kg)  BMI 38.27 kg/m2  LMP 09/28/2015  General Appearance:    Alert, cooperative, no distress, appears stated age  Head:    Normocephalic, without obvious abnormality, atraumatic  Eyes:    PERRL, conjunctiva/corneas clear, EOM's intact, fundi    benign, both eyes  Ears:    Normal TM's and external ear canals, both ears  Nose:   Nares normal, septum midline, mucosa normal, no drainage    or sinus tenderness  Throat:   Lips, mucosa, and tongue normal; teeth and gums normal  Neck:   Supple, symmetrical, trachea midline, no adenopathy;    thyroid:  no enlargement/tenderness/nodules; no carotid   bruit or JVD  Back:     Symmetric, no curvature, ROM normal, no CVA tenderness  Lungs:     Clear to auscultation bilaterally, respirations unlabored  Chest Wall:    No tenderness or deformity   Heart:    Regular rate and rhythm, S1 and S2  normal, no murmur, rub   or gallop  Breast Exam:    No tenderness, masses, or nipple abnormality  Abdomen:     Soft, non-tender, bowel sounds active all four quadrants,    no masses, no organomegaly  Genitalia:    Normal female without lesion, discharge or tenderness, NSSA, NT, no palpable adnexal masses     Extremities:   Extremities normal, atraumatic, no cyanosis or edema  Pulses:   2+ and symmetric all extremities  Skin:   Skin color, texture, turgor normal, no rashes or lesions  Lymph nodes:   Cervical, supraclavicular, and axillary nodes normal  Neurologic:   CNII-XII intact, normal strength, sensation and reflexes    throughout  .    Assessment:    Healthy female exam.   DUB with FH of uterine cancer   Plan:     Thin prep Pap smear. with cotesting EMBX and next visit. Pretreat with cytotec

## 2015-10-05 LAB — CYTOLOGY - PAP

## 2015-10-28 ENCOUNTER — Ambulatory Visit (INDEPENDENT_AMBULATORY_CARE_PROVIDER_SITE_OTHER): Payer: BC Managed Care – PPO | Admitting: Obstetrics & Gynecology

## 2015-10-28 ENCOUNTER — Encounter: Payer: Self-pay | Admitting: Obstetrics & Gynecology

## 2015-10-28 VITALS — BP 127/83 | HR 70 | Resp 18 | Wt 217.0 lb

## 2015-10-28 DIAGNOSIS — N938 Other specified abnormal uterine and vaginal bleeding: Secondary | ICD-10-CM

## 2015-10-28 MED ORDER — MISOPROSTOL 200 MCG PO TABS: ORAL_TABLET | ORAL | 0 refills | Status: DC

## 2015-10-28 NOTE — Progress Notes (Signed)
   Subjective:    Patient ID: Nyoka Linthantel E Maynor, female    DOB: 04/17/1975, 40 y.o.   MRN: [id number]  HPI 40 yo MW lady with DUB and obesity who is here for a embx. She took cytotec 400 mcg last night and 800mg  IBU now. She is finishing up her period today.   Review of Systems     Objective:   Physical Exam WNWHWFNAD Breathing, conversing, and ambulating normally  UPT negative, consent signed, time out done Cervix prepped with betadine and grasped with a single tooth tenaculum Uterus sounded to 9 cm Pipelle used for 2 passes with a moderate amount of tissue obtained. She tolerated the procedure well.          Assessment & Plan:  DUB- await patholgy Contraception- RTC in 2 weeks for Liletta Pretreat with cytotec

## 2015-10-29 ENCOUNTER — Encounter: Payer: Self-pay | Admitting: *Deleted

## 2015-11-02 ENCOUNTER — Telehealth: Payer: Self-pay | Admitting: *Deleted

## 2015-11-02 NOTE — Telephone Encounter (Signed)
Pt called requesting results on endometrial bx, informed her of negative biopsy.  Pt will follow-up on Thursday for IUD insertion.

## 2015-11-04 ENCOUNTER — Encounter: Payer: Self-pay | Admitting: Obstetrics & Gynecology

## 2015-11-04 ENCOUNTER — Ambulatory Visit (INDEPENDENT_AMBULATORY_CARE_PROVIDER_SITE_OTHER): Payer: BC Managed Care – PPO | Admitting: Obstetrics & Gynecology

## 2015-11-04 VITALS — BP 139/92 | HR 80 | Resp 18 | Wt 217.0 lb

## 2015-11-04 DIAGNOSIS — Z3202 Encounter for pregnancy test, result negative: Secondary | ICD-10-CM | POA: Diagnosis not present

## 2015-11-04 DIAGNOSIS — Z30430 Encounter for insertion of intrauterine contraceptive device: Secondary | ICD-10-CM | POA: Diagnosis not present

## 2015-11-04 LAB — POCT URINE PREGNANCY: Preg Test, Ur: NEGATIVE

## 2015-11-04 MED ORDER — LEVONORGESTREL 20 MCG/24HR IU IUD
INTRAUTERINE_SYSTEM | Freq: Once | INTRAUTERINE | Status: AC
  Administered 2015-11-04: 10:00:00 via INTRAUTERINE

## 2015-11-04 MED ORDER — LEVONORGESTREL 20 MCG/24HR IU IUD: INTRAUTERINE_SYSTEM | Freq: Once | INTRAUTERINE | Status: DC

## 2015-11-04 MED ORDER — FLIBANSERIN 100 MG PO TABS: 1.0000 | ORAL_TABLET | Freq: Every day | ORAL | Status: DC

## 2015-11-04 NOTE — Progress Notes (Signed)
   Subjective:    Patient ID: Tamara Shaffer, female    DOB: 11/19/1975, 40 y.o.   MRN: [id number]  HPI 40 yo MW P2 here for Mirena insertion for contraception and menstrual management. Her EMBX from last week was negative. She currently uses condoms.  She has a h/o "NO libido" for the last 2 years but she has had a very stressful job and is raising 2 kids. She loves her husband very much. She has read about Addyi and would like to try it.   Review of Systems     Objective:   Physical Exam WNWHWFNAD Breathing, conversing, and ambulating normally UPT negative, consent signed, Time out procedure done. Cervix prepped with betadine and grasped with a single tooth tenaculum. Mirena was easily placed and the strings were cut to 3-4 cm. Uterus sounded to 9 cm. She tolerated the procedure well.         Assessment & Plan:  Contraception/menstrual management- Mirena Rec back up for 2 weeks Decreased libido- Addyi RTC 3 months

## 2015-12-09 ENCOUNTER — Telehealth: Payer: Self-pay | Admitting: *Deleted

## 2015-12-09 ENCOUNTER — Encounter: Payer: Self-pay | Admitting: *Deleted

## 2015-12-09 DIAGNOSIS — G43009 Migraine without aura, not intractable, without status migrainosus: Secondary | ICD-10-CM

## 2015-12-09 MED ORDER — PROMETHAZINE HCL 25 MG PO TABS: 25.0000 mg | ORAL_TABLET | Freq: Four times a day (QID) | ORAL | 1 refills | Status: DC | PRN

## 2015-12-09 MED ORDER — NAPROXEN SODIUM 550 MG PO TABS: 550.0000 mg | ORAL_TABLET | Freq: Two times a day (BID) | ORAL | 1 refills | Status: DC

## 2015-12-09 NOTE — Telephone Encounter (Signed)
Pt came in office stating that she started having a migraine headache while at work and had to leave, states she has had several on and off for the past few weeks.  Took Imitrex twice which had some relief but wants to know if she can try something else as well.  Sent Phenergan and Anaprox to the pharmacy per Nada MaclachlanKaren Teague Clark order.  Scheduled appt for headache follow-up on 02-04-16

## 2016-01-21 ENCOUNTER — Ambulatory Visit: Payer: BC Managed Care – PPO | Admitting: Obstetrics & Gynecology

## 2016-01-28 ENCOUNTER — Encounter: Payer: Self-pay | Admitting: Family Medicine

## 2016-01-28 ENCOUNTER — Ambulatory Visit (INDEPENDENT_AMBULATORY_CARE_PROVIDER_SITE_OTHER): Payer: BC Managed Care – PPO | Admitting: Family Medicine

## 2016-01-28 VITALS — BP 128/85 | HR 86 | Ht 63.0 in | Wt 219.0 lb

## 2016-01-28 DIAGNOSIS — Z30431 Encounter for routine checking of intrauterine contraceptive device: Secondary | ICD-10-CM

## 2016-01-28 DIAGNOSIS — N921 Excessive and frequent menstruation with irregular cycle: Secondary | ICD-10-CM

## 2016-01-28 DIAGNOSIS — M545 Low back pain, unspecified: Secondary | ICD-10-CM

## 2016-01-28 DIAGNOSIS — N920 Excessive and frequent menstruation with regular cycle: Secondary | ICD-10-CM | POA: Insufficient documentation

## 2016-01-28 MED ORDER — DICLOFENAC SODIUM 25 MG PO TBEC: 75.0000 mg | DELAYED_RELEASE_TABLET | Freq: Two times a day (BID) | ORAL | Status: DC

## 2016-01-28 NOTE — Assessment & Plan Note (Signed)
Better with IUD, if intolerable side effects, consider endometrial ablation with BTL vs hysterectomy

## 2016-01-28 NOTE — Progress Notes (Signed)
Pt here to follow up on IUD. She had IUD inserted because of cyclic breast pain, migraines, and cramping. Since IUD has been inserted she has had abnormal weight gain, increased bladder leakage, ance, facial hair. She has decreased sex drive and was unable to get Addyi filled due to cost of over $800. She states she is boarderline HTN and has hashimoto disease.

## 2016-01-28 NOTE — Patient Instructions (Signed)
Levonorgestrel intrauterine device (IUD) What is this medicine? LEVONORGESTREL IUD (LEE voe nor jes trel) is a contraceptive (birth control) device. The device is placed inside the uterus by a healthcare professional. It is used to prevent pregnancy and can also be used to treat heavy bleeding that occurs during your period. Depending on the device, it can be used for 3 to 5 years. This medicine may be used for other purposes; ask your health care provider or pharmacist if you have questions. What should I tell my health care provider before I take this medicine? They need to know if you have any of these conditions: -abnormal Pap smear -cancer of the breast, uterus, or cervix -diabetes -endometritis -genital or pelvic infection now or in the past -have more than one sexual partner or your partner has more than one partner -heart disease -history of an ectopic or tubal pregnancy -immune system problems -IUD in place -liver disease or tumor -problems with blood clots or take blood-thinners -use intravenous drugs -uterus of unusual shape -vaginal bleeding that has not been explained -an unusual or allergic reaction to levonorgestrel, other hormones, silicone, or polyethylene, medicines, foods, dyes, or preservatives -pregnant or trying to get pregnant -breast-feeding How should I use this medicine? This device is placed inside the uterus by a health care professional. Talk to your pediatrician regarding the use of this medicine in children. Special care may be needed. Overdosage: If you think you have taken too much of this medicine contact a poison control center or emergency room at once. NOTE: This medicine is only for you. Do not share this medicine with others. What if I miss a dose? This does not apply. What may interact with this medicine? Do not take this medicine with any of the following medications: -amprenavir -bosentan -fosamprenavir This medicine may also interact with  the following medications: -aprepitant -barbiturate medicines for inducing sleep or treating seizures -bexarotene -griseofulvin -medicines to treat seizures like carbamazepine, ethotoin, felbamate, oxcarbazepine, phenytoin, topiramate -modafinil -pioglitazone -rifabutin -rifampin -rifapentine -some medicines to treat HIV infection like atazanavir, indinavir, lopinavir, nelfinavir, tipranavir, ritonavir -St. John's wort -warfarin This list may not describe all possible interactions. Give your health care provider a list of all the medicines, herbs, non-prescription drugs, or dietary supplements you use. Also tell them if you smoke, drink alcohol, or use illegal drugs. Some items may interact with your medicine. What should I watch for while using this medicine? Visit your doctor or health care professional for regular check ups. See your doctor if you or your partner has sexual contact with others, becomes HIV positive, or gets a sexual transmitted disease. This product does not protect you against HIV infection (AIDS) or other sexually transmitted diseases. You can check the placement of the IUD yourself by reaching up to the top of your vagina with clean fingers to feel the threads. Do not pull on the threads. It is a good habit to check placement after each menstrual period. Call your doctor right away if you feel more of the IUD than just the threads or if you cannot feel the threads at all. The IUD may come out by itself. You may become pregnant if the device comes out. If you notice that the IUD has come out use a backup birth control method like condoms and call your health care provider. Using tampons will not change the position of the IUD and are okay to use during your period. What side effects may I notice from receiving this medicine?   Side effects that you should report to your doctor or health care professional as soon as possible: -allergic reactions like skin rash, itching or  hives, swelling of the face, lips, or tongue -fever, flu-like symptoms -genital sores -high blood pressure -no menstrual period for 6 weeks during use -pain, swelling, warmth in the leg -pelvic pain or tenderness -severe or sudden headache -signs of pregnancy -stomach cramping -sudden shortness of breath -trouble with balance, talking, or walking -unusual vaginal bleeding, discharge -yellowing of the eyes or skin Side effects that usually do not require medical attention (report to your doctor or health care professional if they continue or are bothersome): -acne -breast pain -change in sex drive or performance -changes in weight -cramping, dizziness, or faintness while the device is being inserted -headache -irregular menstrual bleeding within first 3 to 6 months of use -nausea This list may not describe all possible side effects. Call your doctor for medical advice about side effects. You may report side effects to FDA at 1-800-FDA-1088. Where should I keep my medicine? This does not apply. NOTE: This sheet is a summary. It may not cover all possible information. If you have questions about this medicine, talk to your doctor, pharmacist, or health care provider.    2016, Elsevier/Gold Standard. (2011-04-06 13:54:04)  

## 2016-01-28 NOTE — Progress Notes (Signed)
   Subjective:    Patient ID: Tamara Shaffer is a 40 y.o. female presenting with Follow-up  on 01/28/2016  HPI: Heavy cycles and cramping and migraine headaches in spring and summer. Many people recommended IUD. Has had one placed in 10/2015. Notes acne, hair growth. Also reports weight gain. Thinks she was up to 229 in October and she has lost 10 pounds. Notes overactive bladder is worse due to weight. Still spotting and noted this. Notes poor sex drive, Addyi costs too much. Still notes back pain and bloating.  Review of Systems  Constitutional: Negative for chills and fever.  Respiratory: Negative for shortness of breath.   Cardiovascular: Negative for chest pain.  Gastrointestinal: Negative for abdominal pain, nausea and vomiting.  Genitourinary: Negative for dysuria.  Skin: Negative for rash.      Objective:    BP 128/85   Pulse 86   Ht 5\' 3"  (1.6 m)   Wt 219 lb (99.3 kg)   BMI 38.79 kg/m  Physical Exam  Constitutional: She is oriented to person, place, and time. She appears well-developed and well-nourished. No distress.  HENT:  Head: Normocephalic and atraumatic.  Eyes: No scleral icterus.  Neck: Neck supple.  Cardiovascular: Normal rate.   Pulmonary/Chest: Effort normal.  Abdominal: Soft.  Neurological: She is alert and oriented to person, place, and time.  Skin: Skin is warm and dry.  Psychiatric: She has a normal mood and affect.        Assessment & Plan:   Problem List Items Addressed This Visit      Unprioritized   Menorrhagia    Better with IUD, if intolerable side effects, consider endometrial ablation with BTL vs hysterectomy       Other Visit Diagnoses    Acute bilateral low back pain without sciatica    -  Primary   Trial of Voltaren   Relevant Medications   diclofenac (VOLTAREN) EC tablet 75 mg   IUD check up       will continue for 3 more months, and may desire removal at that time or may get used to levonorgestrel      Total  face-to-face time with patient: 25 minutes. Over 50% of encounter was spent on counseling and coordination of care. Return in about 3 months (around 04/29/2016) for a follow-up.    Tamara Shaffer 01/28/2016 9:57 AM

## 2016-02-04 ENCOUNTER — Institutional Professional Consult (permissible substitution): Payer: BC Managed Care – PPO | Admitting: Physician Assistant

## 2016-02-22 ENCOUNTER — Ambulatory Visit: Payer: BC Managed Care – PPO | Admitting: Primary Care

## 2016-02-23 ENCOUNTER — Ambulatory Visit (INDEPENDENT_AMBULATORY_CARE_PROVIDER_SITE_OTHER): Payer: BC Managed Care – PPO | Admitting: Family Medicine

## 2016-02-23 ENCOUNTER — Encounter: Payer: Self-pay | Admitting: Family Medicine

## 2016-02-23 ENCOUNTER — Ambulatory Visit (INDEPENDENT_AMBULATORY_CARE_PROVIDER_SITE_OTHER)
Admission: RE | Admit: 2016-02-23 | Discharge: 2016-02-23 | Disposition: A | Discharge status: Home / Self Care | Payer: BC Managed Care – PPO | Source: Ambulatory Visit | Attending: Family Medicine | Admitting: Family Medicine

## 2016-02-23 VITALS — BP 110/84 | HR 81 | Ht 63.0 in | Wt 227.1 lb

## 2016-02-23 DIAGNOSIS — M25562 Pain in left knee: Secondary | ICD-10-CM | POA: Diagnosis not present

## 2016-02-23 NOTE — Patient Instructions (Signed)
I will notify you of your xray results via MyChart

## 2016-02-23 NOTE — Progress Notes (Signed)
Subjective:    Patient ID: Tamara Shaffer, female    DOB: 06/23/1975, 40 y.o.   MRN: [id number]  HPI This is a 40 yo female who presents today with left knee pain. She fell about 2 months ago while at work. She slipped on a wet floor, fell onto left knee, was red and tender immediately following fall. Had rare ntermittent pain until 6 days ago when she rolled over in bed and felt a sharp, stabbing pain. Pain resolved and she continues to have pain with palpation only, not with activity. 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   Past Medical History:  Diagnosis Date  . Allergic rhinitis    seasonal  . GDM (gestational diabetes mellitus)   . GERD (gastroesophageal reflux disease)   . Hypothyroidism    medically treated, likely initially thyroiditis (heterogeneous gland on US 05/2015)  . Insomnia   . 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 Obesity   . Prediabetes    A1c 5.8% 11/2010  . Urinary incontinence   . Vulvar burning    Past Surgical History:  Procedure Laterality Date  . BREAST SURGERY  06/1994   Reconstruction  . CHOLECYSTECTOMY    . ECTOPIC PREGNANCY SURGERY  10/2008   Tube remained  . thyroid us  06/2010   heterogeneous normal sized thyroid gland consistent with chronic thyroiditis   Family History  Problem Relation Age of Onset  . Cancer Mother     leiomyosarcoma of abdomen  . Hypertension Father   . Heart disease Father     Porcine Valve Replacement  . Cancer Maternal Grandmother     Ovarian cancer  . Cancer Cousin     Thyroid cancer  . Diabetes Neg Hx   . Stroke Neg Hx    Social History  Substance Use Topics  . Smoking status: Never Smoker  . Smokeless tobacco: Never Used  . Alcohol use Yes     Comment: Socially      Review of Systems Per HPI    Objective:   Physical Exam  Constitutional: She is oriented to person, place, and time.  She appears well-developed and well-nourished. No distress.  HENT:  Head: Normocephalic.  Pulmonary/Chest: Effort normal.  Musculoskeletal: Normal range of motion.       Left knee: She exhibits normal range of motion, no swelling, no effusion, no ecchymosis, no erythema, no LCL laxity, normal patellar mobility and no MCL laxity.       Legs: Neurological: She is alert and oriented to person, place, and time.  Skin: Skin is warm and dry. She is not diaphoretic.  Psychiatric: She has a normal mood and affect. Her behavior is normal. Judgment and thought content normal.  Vitals reviewed.     BP 110/84   Pulse 81   Ht 5\' 3"  (1.6 m)   Wt 227 lb 1.9 oz (103 kg)   SpO2 98%   BMI 40.23 kg/m  Wt Readings from Last 3 Encounters:  02/23/16 227 lb 1.9 oz (103 kg)  01/28/16 219 lb (99.3 kg)  11/04/15 217 lb (98.4 kg)       Assessment & Plan:  1. Acute pain of left knee - given fall onto patella and subsequent pain, will obtain xray - DG Knee 1-2 Views Left; Future - will notify her of results and if negative, will  continue to monitor for any swelling, limit of movement or increased pain  Olean Reeeborah Laryn Venning, FNP-BC   [facility] [place]  02/23/2016 4:32 PM

## 2016-03-01 ENCOUNTER — Encounter: Payer: Self-pay | Admitting: Internal Medicine

## 2016-03-01 ENCOUNTER — Ambulatory Visit (INDEPENDENT_AMBULATORY_CARE_PROVIDER_SITE_OTHER): Payer: BC Managed Care – PPO | Admitting: Internal Medicine

## 2016-03-01 VITALS — BP 124/82 | HR 59 | Temp 98.3°F | Wt 224.8 lb

## 2016-03-01 DIAGNOSIS — J0100 Acute maxillary sinusitis, unspecified: Secondary | ICD-10-CM | POA: Diagnosis not present

## 2016-03-01 MED ORDER — DOXYCYCLINE HYCLATE 100 MG PO TABS: 100.0000 mg | ORAL_TABLET | Freq: Two times a day (BID) | ORAL | 0 refills | Status: DC

## 2016-03-01 NOTE — Progress Notes (Signed)
Pre visit review using our clinic review tool, if applicable. No additional management support is needed unless otherwise documented below in the visit note. 

## 2016-03-01 NOTE — Progress Notes (Signed)
HPI  Pt presents to the clinic today with c/o headache, facial pain and pressure, nasal congestion, sore throat and cough. This started 1 week ago. She is blowing yellow/green mucous out of her nose. She denies difficulty swallowing. The cough is productive of yellow/green mucous. She has run fevers up to 100.6. She denies chills or body aches. She has tried Sudafed, Dayquil, Nyquil, Mucinex and Tylenol with some relief. She has a history of seasonal allergies. She has had sick contacts.  Review of Systems     Past Medical History:  Diagnosis Date  . Allergic rhinitis    seasonal  . GDM (gestational diabetes mellitus)   . GERD (gastroesophageal reflux disease)   . Hypothyroidism    medically treated, likely initially thyroiditis (heterogeneous gland on US 05/2015)  . Insomnia   . 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 Obesity   . Prediabetes    A1c 5.8% 11/2010  . Urinary incontinence   . Vulvar burning     Family History  Problem Relation Age of Onset  . Cancer Mother     leiomyosarcoma of abdomen  . Hypertension Father   . Heart disease Father     Porcine Valve Replacement  . Cancer Maternal Grandmother     Ovarian cancer  . Cancer Cousin     Thyroid cancer  . Diabetes Neg Hx   . Stroke Neg Hx     Social History   Social History  . Marital status: Married    Spouse name: N/A  . Number of children: 2  . Years of education: N/A   Occupational History  . 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 Smoking status: Never Smoker  . Smokeless tobacco: Never Used  . Alcohol use Yes     Comment: Socially  . Drug use: No  . Sexual activity: Yes    Partners: Male    Birth control/ protection: Condom   Other Topics Concern  . Not on file   Social History Narrative   Caffeine:  1 cup daily    Allergies  Allergen Reactions  . Erythromycin     REACTION: GI upset, rash, SOB  .  Penicillins     REACTION: as child, ok with amoxicillin in past     Constitutional: Positive headache, fatigue and fever. Denies abrupt weight changes.  HEENT:  Positive facial pain, nasal congestion and sore throat. Denies eye redness, ear pain, ringing in the ears, wax buildup, runny nose or bloody nose. Respiratory: Positive cough. Denies difficulty breathing or shortness of breath.  Cardiovascular: Denies chest pain, chest tightness, palpitations or swelling in the hands or feet.   No other specific complaints in a complete review of systems (except as listed in HPI above).  Objective:   BP 124/82   Pulse (!) 59   Temp 98.3 F (36.8 C) (Oral)   Wt 224 lb 12 oz (101.9 kg)   SpO2 98%   BMI 39.81 kg/m   General: Appears his stated age, ill appearing in NAD. HEENT: Head: normal shape and size, maxillary sinus tenderness noted; Eyes: sclera white, no icterus, conjunctiva pink; Ears: Tm's gray and intact, normal light reflex, + serous effusion bilaterally; Nose: mucosa pink and moist, septum midline; Throat/Mouth: + PND. Teeth present, mucosa erythematous and moist, no exudate noted, no lesions or ulcerations noted.  Neck:  No adenopathy noted.  Cardiovascular: Normal rate and rhythm.  S1,S2 noted.  No murmur, rubs or gallops noted.  Pulmonary/Chest: Normal effort and positive vesicular breath sounds. No respiratory distress. No wheezes, rales or ronchi noted.       Assessment & Plan:   Acute bacterial sinusitis  Can use a Neti Pot which can be purchased from your local drug store. Flonase 2 sprays each nostril for 3 days and then as needed. eRx for Doxycycline 100 mg BID for 10 days Work note provided  RTC as needed or if symptoms persist. Nicki ReaperBAITY, REGINA, NP

## 2016-03-01 NOTE — Patient Instructions (Signed)

## 2016-03-29 ENCOUNTER — Other Ambulatory Visit: Payer: Self-pay | Admitting: *Deleted

## 2016-03-29 MED ORDER — HYDROCHLOROTHIAZIDE 25 MG PO TABS: 25.0000 mg | ORAL_TABLET | Freq: Every day | ORAL | 3 refills | Status: DC

## 2016-04-04 ENCOUNTER — Encounter: Payer: Self-pay | Admitting: Family Medicine

## 2016-04-07 ENCOUNTER — Encounter: Payer: Self-pay | Admitting: Physician Assistant

## 2016-04-07 ENCOUNTER — Ambulatory Visit: Payer: Self-pay | Admitting: Family Medicine

## 2016-04-07 ENCOUNTER — Encounter: Payer: Self-pay | Admitting: Family Medicine

## 2016-04-07 ENCOUNTER — Ambulatory Visit (INDEPENDENT_AMBULATORY_CARE_PROVIDER_SITE_OTHER): Payer: BC Managed Care – PPO | Admitting: Physician Assistant

## 2016-04-07 ENCOUNTER — Ambulatory Visit (INDEPENDENT_AMBULATORY_CARE_PROVIDER_SITE_OTHER): Payer: BC Managed Care – PPO | Admitting: Family Medicine

## 2016-04-07 VITALS — BP 120/84 | HR 83 | Ht 63.0 in | Wt 231.0 lb

## 2016-04-07 VITALS — BP 128/72 | HR 86 | Temp 98.8°F | Wt 232.8 lb

## 2016-04-07 DIAGNOSIS — F32A Depression, unspecified: Secondary | ICD-10-CM

## 2016-04-07 DIAGNOSIS — K219 Gastro-esophageal reflux disease without esophagitis: Secondary | ICD-10-CM | POA: Diagnosis not present

## 2016-04-07 DIAGNOSIS — G43009 Migraine without aura, not intractable, without status migrainosus: Secondary | ICD-10-CM

## 2016-04-07 DIAGNOSIS — G4733 Obstructive sleep apnea (adult) (pediatric): Secondary | ICD-10-CM | POA: Diagnosis not present

## 2016-04-07 DIAGNOSIS — R030 Elevated blood-pressure reading, without diagnosis of hypertension: Secondary | ICD-10-CM

## 2016-04-07 DIAGNOSIS — E039 Hypothyroidism, unspecified: Secondary | ICD-10-CM

## 2016-04-07 DIAGNOSIS — F329 Major depressive disorder, single episode, unspecified: Secondary | ICD-10-CM

## 2016-04-07 NOTE — Progress Notes (Signed)
BP 128/72   Pulse 86   Temp 98.8 F (37.1 C) (Oral)   Wt 232 lb 12 oz (105.6 kg)   SpO2 97%   BMI 41.23 kg/m    CC: snoring Subjective:    Patient ID: Tamara Shaffer, female    DOB: 10/16/1975, 41 y.o.   MRN: [id number]  HPI: Tamara LintChantel E Furgason is a 41 y.o. female presenting on 04/07/2016 for Snoring   Saw headache clinic this morning, note reviewed. On Effexor prophylactically.   Witnessed apneic episodes at night time on recent trip out of town with friends. Known chronic and loud snorer. Daytime somnolence, non-restorative sleeping, easily falls asleep in couch. No trouble with driving - doesn't really drive long distances.   Hypothyroidism diagnosed 2009, on treatment since then. Never hyperthyroid. Compliant with brand synthroid 125mcg daily.  Lab Results  Component Value Date   TSH 1.660 05/19/2015    IUD placed 10/2015.   Planning on starting nutrition classes at hospital.   Taking aleve daily for L knee pain - takes omeprazole every morning   Relevant past medical, surgical, family and social history reviewed and updated as indicated. Interim medical history since our last visit reviewed. Allergies and medications reviewed and updated. Current Outpatient Prescriptions on File Prior to Visit  Medication Sig  . hydrochlorothiazide (HYDRODIURIL) 25 MG tablet Take 1 tablet (25 mg total) by mouth daily.  Marland Kitchen. loratadine (CLARITIN) 10 MG tablet Take 10 mg by mouth daily.  . Multiple Vitamin (MULTIVITAMIN) tablet Take 1 tablet by mouth daily.    . naproxen sodium (ANAPROX DS) 550 MG tablet Take 1 tablet (550 mg total) by mouth 2 (two) times daily with a meal.  . omeprazole (PRILOSEC) 20 MG capsule TAKE 1 CAPSULE DAILY  . promethazine (PHENERGAN) 25 MG tablet Take 1 tablet (25 mg total) by mouth every 6 (six) hours as needed for nausea or vomiting.  . SUMAtriptan (IMITREX) 100 MG tablet Take 1 tablet (100 mg total) by mouth once as needed for migraine.  Marland Kitchen. SYNTHROID 125 MCG  tablet Take 1 tablet (125 mcg total) by mouth daily before breakfast.  . venlafaxine XR (EFFEXOR-XR) 75 MG 24 hr capsule    Current Facility-Administered Medications on File Prior to Visit  Medication  . diclofenac (VOLTAREN) EC tablet 75 mg    Review of Systems Per HPI unless specifically indicated in ROS section     Objective:    BP 128/72   Pulse 86   Temp 98.8 F (37.1 C) (Oral)   Wt 232 lb 12 oz (105.6 kg)   SpO2 97%   BMI 41.23 kg/m   Wt Readings from Last 3 Encounters:  04/07/16 232 lb 12 oz (105.6 kg)  04/07/16 231 lb (104.8 kg)  03/01/16 224 lb 12 oz (101.9 kg)    Physical Exam  Constitutional: She appears well-developed and well-nourished. No distress.  HENT:  Mouth/Throat: Oropharynx is clear and moist. No oropharyngeal exudate.  3+ tonsils  Eyes: Conjunctivae are normal. Pupils are equal, round, and reactive to light.  Neck: Normal range of motion. Neck supple. No thyromegaly present.  Cardiovascular: Normal rate, regular rhythm, normal heart sounds and intact distal pulses.   No murmur heard. Pulmonary/Chest: Effort normal and breath sounds normal. No respiratory distress. She has no wheezes. She has no rales.  Musculoskeletal: She exhibits no edema.  Lymphadenopathy:    She has no cervical adenopathy.  Skin: Skin is warm and dry. No rash noted.  Psychiatric: She has a  normal mood and affect.  Nursing note and vitals reviewed.      Assessment & Plan:   Problem List Items Addressed This Visit    GERD (gastroesophageal reflux disease)    Controlled with daily PPI.      Hypothyroidism    Compliant with brand synthroid daily. Continue. Check TSH today.       Relevant Orders   TSH   Migraine without aura    Stable on current regimen, followed by HA center.      OSA (obstructive sleep apnea) - Primary    Endorses snoring, witnessed night time apnea, daytime somnolence and easily falling asleep. Along with obesity, at high risk for sleep  apnea. Will refer to sleep doctor for further eval/management.  ESS = 19.      Relevant Orders   Ambulatory referral to Pulmonology   Severe obesity (BMI >= 40) (HCC)    Weight gain noted. Planning on seeing nutritionist weekly for improved family health and goal weight loss.       Relevant Orders   Ambulatory referral to Pulmonology       Follow up plan: Return in about 1 year (around 04/07/2017) for follow up visit.  Eustaquio Boyden, MD

## 2016-04-07 NOTE — Assessment & Plan Note (Signed)
Stable on current regimen, followed by HA center.

## 2016-04-07 NOTE — Assessment & Plan Note (Signed)
Endorses snoring, witnessed night time apnea, daytime somnolence and easily falling asleep. Along with obesity, at high risk for sleep apnea. Will refer to sleep doctor for further eval/management.  ESS = 19.

## 2016-04-07 NOTE — Addendum Note (Signed)
Addended by: Liane ComberHAVERS, NATASHA C on: 04/07/2016 11:07 AM   Modules accepted: Orders

## 2016-04-07 NOTE — Progress Notes (Signed)
History:  Tamara Shaffer is a 41 y.o. G2P2 who presents to clinic today for followup of migraine headaches.   She was formerly seen here for this problem by my colleague, Tamara Shaffer.  She has not been seen for this complaint in well over a year.  Her meds have been refilled by other providers and she is presently doing quite well from a headache standpoint.    Migraines are caused by stress/depression and anxiety.  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  This was very helpful.  She has 2 boys: ages 456 and 769.  They are well.  Her [place] in [place].  THis time last year her mom was found to have a cancer.  It was initially thought to be terminal but she recovered well.  However her mom recently developed symptoms again and the patient is very worried.   She was using both Effexor 75mg  and Trokendi 50mg  for migraine prevention.  Came off Trokendi and has not noticed increase in HA.  She continues to use Effexor daily.  Her BP has been borderline on this med.  HCTZ prescribed to help.   HA's have been great lately.  There were more HAs initially when she got her IUD (8/17).  This seems to have improved in recent months.   Anaprox and Imitrex work well for acute HA.    HIT6:62 Number of days in the last 4 weeks with:  Severe headache: 0 Moderate headache: 1 Mild headache: 0  No headache: 27   Past Medical History:  Diagnosis Date  . Allergic rhinitis    seasonal  . GDM (gestational diabetes mellitus)   . GERD (gastroesophageal reflux disease)   . Hypothyroidism    medically treated, likely initially thyroiditis (heterogeneous gland on US 05/2015)  . Insomnia   . 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 Obesity   . Prediabetes    A1c 5.8% 11/2010  . Urinary incontinence   . Vulvar burning     Social History   Social History  . Marital status: Married    Spouse name: N/A  . Number of  children: 2  . Years of education: N/A   Occupational History  . Teaches school, 2nd grade /[employer]   Social History Main Topics  . Smoking status: Never Smoker  . Smokeless tobacco: Never Used  . Alcohol use Yes     Comment: Socially  . Drug use: No  . Sexual activity: Yes    Partners: Male    Birth control/ protection: Condom   Other Topics Concern  . Not on file   Social History Narrative   Caffeine:  1 cup daily    Family History  Problem Relation Age of Onset  . Cancer Mother     leiomyosarcoma of abdomen  . Hypertension Father   . Heart disease Father     Porcine Valve Replacement  . Cancer Maternal Grandmother     Ovarian cancer  . Cancer Cousin     Thyroid cancer  . Diabetes Neg Hx   . Stroke Neg Hx     Allergies  Allergen Reactions  . Erythromycin     REACTION: GI upset, rash, SOB  . Penicillins     REACTION: as child, ok with amoxicillin in past    Current Outpatient Prescriptions on File Prior to Visit  Medication Sig Dispense Refill  .  hydrochlorothiazide (HYDRODIURIL) 25 MG tablet Take 1 tablet (25 mg total) by mouth daily. 90 tablet 3  . loratadine (CLARITIN) 10 MG tablet Take 10 mg by mouth daily.    . Multiple Vitamin (MULTIVITAMIN) tablet Take 1 tablet by mouth daily.      . naproxen sodium (ANAPROX DS) 550 MG tablet Take 1 tablet (550 mg total) by mouth 2 (two) times daily with a meal. 60 tablet 1  . omeprazole (PRILOSEC) 20 MG capsule TAKE 1 CAPSULE DAILY 90 capsule 3  . promethazine (PHENERGAN) 25 MG tablet Take 1 tablet (25 mg total) by mouth every 6 (six) hours as needed for nausea or vomiting. 30 tablet 1  . SUMAtriptan (IMITREX) 100 MG tablet Take 1 tablet (100 mg total) by mouth once as needed for migraine. 27 tablet 4  . SYNTHROID 125 MCG tablet Take 1 tablet (125 mcg total) by mouth daily before breakfast. 90 tablet 3  . venlafaxine XR (EFFEXOR-XR) 75 MG 24 hr capsule      Current Facility-Administered Medications on  File Prior to Visit  Medication Dose Route Frequency Provider Last Rate Last Dose  . diclofenac (VOLTAREN) EC tablet 75 mg  75 mg Oral BID Reva Bores, MD         Review of Systems:  All pertinent positive/negative included in HPI, all other review of systems are negative   Objective:  Physical Exam BP 120/84   Pulse 83   Ht 5\' 3"  (1.6 m)   Wt 231 lb (104.8 kg)   BMI 40.92 kg/m  CONSTITUTIONAL: Well-developed, well-nourished female in no acute distress.  EYES: EOM intact ENT: Normocephalic CARDIOVASCULAR: Regular rate and rhythm with no adventitious sounds.  RESPIRATORY: Normal rate.  MUSCULOSKELETAL: Normal ROM SKIN: Warm, dry without erythema  NEUROLOGICAL: Alert, oriented, CN II-XII grossly intact, Appropriate balance PSYCH: Normal behavior, mood   Assessment & Plan:  Assessment: 1. Migraine without aura and without status migrainosus, not intractable   2. Elevated BP without diagnosis of hypertension   3. Depression, unspecified depression type    Stable problems  Plan: Cont Effexor for migraine prevention/depression Cont Imitrex/anaprox for acute treatment Cont HCTZ for borderline HTN that may be caused by Effexor Can call in rx should they be needed, although it appears she is up to date at present. Follow-up in 12 months or sooner PRN  Bertram Denver, PA-C 04/07/2016 8:40 AM

## 2016-04-07 NOTE — Patient Instructions (Signed)
Labs today Pass by [facility] to schedule appointment with sleep doctors.  Good to see you today, call us with questions.

## 2016-04-07 NOTE — Patient Instructions (Signed)
Migraine Headache A migraine headache is an intense, throbbing pain on one side or both sides of the head. Migraines may also cause other symptoms, such as nausea, vomiting, and sensitivity to light and noise. What are the causes? Doing or taking certain things may also trigger migraines, such as:  Alcohol.  Smoking.  Medicines, such as: ? Medicine used to treat chest pain (nitroglycerine). ? Birth control pills. ? Estrogen pills. ? Certain blood pressure medicines.  Aged cheeses, chocolate, or caffeine.  Foods or drinks that contain nitrates, glutamate, aspartame, or tyramine.  Physical activity.  Other things that may trigger a migraine include:  Menstruation.  Pregnancy.  Hunger.  Stress, lack of sleep, too much sleep, or fatigue.  Weather changes.  What increases the risk? The following factors may make you more likely to experience migraine headaches:  Age. Risk increases with age.  Family history of migraine headaches.  Being Caucasian.  Depression and anxiety.  Obesity.  Being a woman.  Having a hole in the heart (patent foramen ovale) or other heart problems.  What are the signs or symptoms? The main symptom of this condition is pulsating or throbbing pain. Pain may:  Happen in any area of the head, such as on one side or both sides.  Interfere with daily activities.  Get worse with physical activity.  Get worse with exposure to bright lights or loud noises.  Other symptoms may include:  Nausea.  Vomiting.  Dizziness.  General sensitivity to bright lights, loud noises, or smells.  Before you get a migraine, you may get warning signs that a migraine is developing (aura). An aura may include:  Seeing flashing lights or having blind spots.  Seeing bright spots, halos, or zigzag lines.  Having tunnel vision or blurred vision.  Having numbness or a tingling feeling.  Having trouble talking.  Having muscle weakness.  How is this  diagnosed? A migraine headache can be diagnosed based on:  Your symptoms.  A physical exam.  Tests, such as CT scan or MRI of the head. These imaging tests can help rule out other causes of headaches.  Taking fluid from the spine (lumbar puncture) and analyzing it (cerebrospinal fluid analysis, or CSF analysis).  How is this treated? A migraine headache is usually treated with medicines that:  Relieve pain.  Relieve nausea.  Prevent migraines from coming back.  Treatment may also include:  Acupuncture.  Lifestyle changes like avoiding foods that trigger migraines.  Follow these instructions at home: Medicines  Take over-the-counter and prescription medicines only as told by your health care provider.  Do not drive or use heavy machinery while taking prescription pain medicine.  To prevent or treat constipation while you are taking prescription pain medicine, your health care provider may recommend that you: ? Drink enough fluid to keep your urine clear or pale yellow. ? Take over-the-counter or prescription medicines. ? Eat foods that are high in fiber, such as fresh fruits and vegetables, whole grains, and beans. ? Limit foods that are high in fat and processed sugars, such as fried and sweet foods. Lifestyle  Avoid alcohol use.  Do not use any products that contain nicotine or tobacco, such as cigarettes and e-cigarettes. If you need help quitting, ask your health care provider.  Get at least 8 hours of sleep every night.  Limit your stress. General instructions   Keep a journal to find out what may trigger your migraine headaches. For example, write down: ? What you eat and   drink. ? How much sleep you get. ? Any change to your diet or medicines.  If you have a migraine: ? Avoid things that make your symptoms worse, such as bright lights. ? It may help to lie down in a dark, quiet room. ? Do not drive or use heavy machinery. ? Ask your health care provider  what activities are safe for you while you are experiencing symptoms.  Keep all follow-up visits as told by your health care provider. This is important. Contact a health care provider if:  You develop symptoms that are different or more severe than your usual migraine symptoms. Get help right away if:  Your migraine becomes severe.  You have a fever.  You have a stiff neck.  You have vision loss.  Your muscles feel weak or like you cannot control them.  You start to lose your balance often.  You develop trouble walking.  You faint. This information is not intended to replace advice given to you by your health care provider. Make sure you discuss any questions you have with your health care provider. Document Released: 03/06/2005 Document Revised: 09/24/2015 Document Reviewed: 08/23/2015 Elsevier Interactive Patient Education  2017 [employer] Inc[employer]   

## 2016-04-07 NOTE — Assessment & Plan Note (Signed)
Compliant with brand synthroid daily. Continue. Check TSH today.

## 2016-04-07 NOTE — Assessment & Plan Note (Signed)
Controlled with daily PPI 

## 2016-04-07 NOTE — Assessment & Plan Note (Signed)
Weight gain noted. Planning on seeing nutritionist weekly for improved family health and goal weight loss.

## 2016-04-08 LAB — TSH: TSH: 1.47 u[IU]/mL (ref 0.450–4.500)

## 2016-04-26 ENCOUNTER — Encounter: Payer: Self-pay | Admitting: Pulmonary Disease

## 2016-04-26 ENCOUNTER — Ambulatory Visit (INDEPENDENT_AMBULATORY_CARE_PROVIDER_SITE_OTHER): Payer: BC Managed Care – PPO | Admitting: Pulmonary Disease

## 2016-04-26 VITALS — BP 132/92 | HR 105 | Ht 63.0 in | Wt 233.6 lb

## 2016-04-26 DIAGNOSIS — G4710 Hypersomnia, unspecified: Secondary | ICD-10-CM

## 2016-04-26 NOTE — Progress Notes (Signed)
Subjective:    Patient ID: Tamara Shaffer, female    DOB: 1975-05-16, 41 y.o.   MRN: [id number]  HPI   This is the case of Tamara Shaffer, 41 y.o. Female, who was referred by Dr. Eustaquio Boyden in consultation regarding possible OSA.   As you very well know, patient is a non smoker, not known to have asthma or copd.  She has snoring, witnessed apneas, gasping, choking, tiredness, fatigue, frequent awakenings, unrefreshed sleep. She sleeps 8-10 hrs/night and feels unrefreshed.   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  By 3 pm, she gets really sleepy.   (+) sleep talking.  (-) other abnormal behavior in sleep.   She has depression and anxiety and both are stable.   ESS 20. Very symptomatic.   Her friends husbands have OSA and they use cpap.   Review of Systems  Constitutional: Negative.  Negative for fever and unexpected weight change.  HENT: Negative.  Negative for congestion, dental problem, ear pain, nosebleeds, postnasal drip, rhinorrhea, sinus pressure, sneezing, sore throat and trouble swallowing.   Eyes: Negative.  Negative for redness and itching.  Respiratory: Negative.  Negative for cough, chest tightness, shortness of breath and wheezing.   Cardiovascular: Negative.  Negative for palpitations and leg swelling.  Gastrointestinal: Negative.  Negative for nausea and vomiting.  Endocrine: Negative.   Genitourinary: Negative.  Negative for dysuria.  Musculoskeletal: Negative.  Negative for joint swelling.  Skin: Negative.  Negative for rash.  Allergic/Immunologic: Positive for environmental allergies.  Neurological: Negative.  Negative for headaches.  Hematological: Does not bruise/bleed easily.  Psychiatric/Behavioral: Negative.  Negative for dysphoric mood. The patient is not nervous/anxious.    Past Medical History:  Diagnosis Date  . Allergic rhinitis    seasonal  . GDM (gestational diabetes mellitus)   . GERD (gastroesophageal reflux disease)   . Hypothyroidism    medically treated, likely initially thyroiditis (heterogeneous gland on [place] 05/2015)  . Insomnia   . 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 Obesity   . Prediabetes    A1c 5.8% 11/2010  . Urinary incontinence   . Vulvar burning    (-) CA, DVT  Family History  Problem Relation Age of Onset  . Cancer Mother     leiomyosarcoma of abdomen  . Hypertension Father   . Heart disease Father     Porcine Valve Replacement  . Cancer Maternal Grandmother     Ovarian cancer  . Cancer Cousin     Thyroid cancer  . Diabetes Neg Hx   . Stroke Neg Hx      Past Surgical History:  Procedure Laterality Date  . BREAST SURGERY  06/1994   Reconstruction  . CHOLECYSTECTOMY    . ECTOPIC PREGNANCY SURGERY  10/2008   Tube remained  . thyroid US  06/2010   heterogeneous normal sized thyroid gland consistent with chronic thyroiditis    Social History   Social History  . Marital status: Married    Spouse name: N/A  . Number of children: 2  . Years of education: N/A   Occupational History  . Teaches school, 2nd grade [employer]   Social History Main Topics  . Smoking status: Never Smoker  . Smokeless tobacco: Never Used  . Alcohol use Yes     Comment: Socially  . Drug use: No  . Sexual activity: Yes    Partners: Male    Birth control/ protection: Condom  Other Topics Concern  . Not on file   Social History Narrative   Caffeine:  1 cup daily   Married, 2 boys.  She is a Runner, broadcasting/film/videoteacher.   Allergies  Allergen Reactions  . Erythromycin     REACTION: GI upset, rash, SOB  . Penicillins     REACTION: as child, ok with amoxicillin in past     Outpatient Medications Prior to Visit  Medication Sig Dispense Refill  . hydrochlorothiazide (HYDRODIURIL) 25 MG tablet Take 1 tablet (25 mg total) by mouth daily. 90 tablet 3  . loratadine (CLARITIN) 10 MG tablet Take 10 mg by mouth daily.    . Multiple Vitamin (MULTIVITAMIN) tablet Take 1  tablet by mouth daily.      . naproxen sodium (ANAPROX DS) 550 MG tablet Take 1 tablet (550 mg total) by mouth 2 (two) times daily with a meal. 60 tablet 1  . omeprazole (PRILOSEC) 20 MG capsule TAKE 1 CAPSULE DAILY 90 capsule 3  . promethazine (PHENERGAN) 25 MG tablet Take 1 tablet (25 mg total) by mouth every 6 (six) hours as needed for nausea or vomiting. 30 tablet 1  . SUMAtriptan (IMITREX) 100 MG tablet Take 1 tablet (100 mg total) by mouth once as needed for migraine. 27 tablet 4  . SYNTHROID 125 MCG tablet Take 1 tablet (125 mcg total) by mouth daily before breakfast. 90 tablet 3  . venlafaxine XR (EFFEXOR-XR) 75 MG 24 hr capsule      Facility-Administered Medications Prior to Visit  Medication Dose Route Frequency Provider Last Rate Last Dose  . diclofenac (VOLTAREN) EC tablet 75 mg  75 mg Oral BID Reva Boresanya S Pratt, MD       No orders of the defined types were placed in this encounter.        Objective:   Physical Exam  Vitals:  Vitals:   04/26/16 1112  BP: (!) 132/92  Pulse: (!) 105  SpO2: 98%  Weight: 233 lb 9.6 oz (106 kg)  Height: 5\' 3"  (1.6 m)    Constitutional/General:  Pleasant, well-nourished, well-developed, not in any distress,  Comfortably seating.  Well kempt  Body mass index is 41.38 kg/m. Wt Readings from Last 3 Encounters:  04/26/16 233 lb 9.6 oz (106 kg)  04/07/16 232 lb 12 oz (105.6 kg)  04/07/16 231 lb (104.8 kg)    HEENT: Pupils equal and reactive to light and accommodation. Anicteric sclerae. Normal nasal mucosa.   No oral  lesions,  mouth clear,  oropharynx clear, no postnasal drip. (-) Oral thrush. No dental caries.  Airway - Mallampati class IV.   Neck: No masses. Midline trachea. No JVD, (-) LAD. (-) bruits appreciated.  Respiratory/Chest: Grossly normal chest. (-) deformity. (-) Accessory muscle use.  Symmetric expansion. (-) Tenderness on palpation.  Resonant on percussion.  Diminished BS on both lower lung zones. (-) wheezing,  crackles, rhonchi (-) egophony  Cardiovascular: Regular rate and  rhythm, heart sounds normal, no murmur or gallops, no peripheral edema  Gastrointestinal:  Normal bowel sounds. Soft, non-tender. No hepatosplenomegaly.  (-) masses.   Musculoskeletal:  Normal muscle tone. Normal gait.   Extremities: Grossly normal. (-) clubbing, cyanosis.  (-) edema  Skin: (-) rash,lesions seen.   Neurological/Psychiatric : alert, oriented to time, place, person. Normal mood and affect          Assessment & Plan:  Hypersomnia Pt has snoring, witnessed apneas, gasping, choking, tiredness, fatigue, frequent awakenings, unrefreshed sleep. She sleeps 8-10 hrs/night and feels unrefreshed.  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  By 3 pm, she gets really sleepy.   (+) sleep talking.  (-) other abnormal behavior in sleep.   She has depression and anxiety and is stable.   ESS 20. Very symptomatic.   Plan ; We discussed about the diagnosis of Obstructive Sleep Apnea (OSA) and implications of untreated OSA. We discussed about CPAP and BiPaP as possible treatment options.    We will schedule the patient for a sleep study. Plan for a HST. If (-), she will need a lab study. Very symptomatic.  Likely has severe.  May have claustrophobia, may benefit from nasal mask or nasal pillows. May need Bipap. Pt will be away from march 3-10 for a cruise.    Patient was instructed to call the office if he/she has not heard back from the office 1-2 weeks after the sleep study.   Patient was instructed to call the office if he/she is having issues with the PAP device.   We discussed good sleep hygiene.   Patient was advised not to engage in activities requiring concentration and/or vigilance if he/she is sleepy.  Patient was advised not to drive if he/she is sleepy.      Thank you very much for letting me participate in this patient's care. Please do not hesitate to give me a call if you have any questions or concerns  regarding the treatment plan.   Patient will follow up with me mid April.     Pollie Meyer, MD 04/26/2016   11:40 AM Pulmonary and Critical Care Medicine  [facility] Pager: [phone] Office: [phone], Fax: [phone]

## 2016-04-26 NOTE — Assessment & Plan Note (Signed)
Pt has snoring, witnessed apneas, gasping, choking, tiredness, fatigue, frequent awakenings, unrefreshed sleep. She sleeps 8-10 hrs/night and feels unrefreshed.   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  By 3 pm, she gets really sleepy.   (+) sleep talking.  (-) other abnormal behavior in sleep.   She has depression and anxiety and is stable.   ESS 20. Very symptomatic.   Plan ; We discussed about the diagnosis of Obstructive Sleep Apnea (OSA) and implications of untreated OSA. We discussed about CPAP and BiPaP as possible treatment options.    We will schedule the patient for a sleep study. Plan for a HST. If (-), she will need a lab study. Very symptomatic.  Likely has severe.  May have claustrophobia, may benefit from nasal mask or nasal pillows. May need Bipap. Pt will be away from march 3-10 for a cruise.    Patient was instructed to call the office if he/she has not heard back from the office 1-2 weeks after the sleep study.   Patient was instructed to call the office if he/she is having issues with the PAP device.   We discussed good sleep hygiene.   Patient was advised not to engage in activities requiring concentration and/or vigilance if he/she is sleepy.  Patient was advised not to drive if he/she is sleepy.

## 2016-04-26 NOTE — Patient Instructions (Signed)
It was a pleasure taking care of you today!  We will schedule you to have a sleep study to determine if you have sleep apnea.   We will get a home sleep test.  You will be instructed to come back to the office to get an apparatus to sleep with overnight.  Once we have the apparatus, it will usually take us 1-2 weeks to read the study and get back at you with results of the test.  Please give us a call in 2 weeks after your study if you do not hear back from us.    If the sleep study is positive, we will order you a CPAP  machine.  Please call the office if you do NOT receive your machine in the next 1-2 weeks.   Please make sure you use your CPAP device everytime you sleep.  We will monitor the usage of your machine per your insurance requirement.  Your insurance company may take the machine from you if you are not using it regularly.   Please clean the mask, tubings, filter, water reservoir with soapy water every week.  Please use distilled water for the water reservoir.   Please call the office or your machine provider (DME company) if you are having issues with the device.   Return to clinic in end of April  with Dr. Christene Slatese Dios or NP

## 2016-05-09 ENCOUNTER — Institutional Professional Consult (permissible substitution): Payer: BC Managed Care – PPO | Admitting: Pulmonary Disease

## 2016-05-09 DIAGNOSIS — G4733 Obstructive sleep apnea (adult) (pediatric): Secondary | ICD-10-CM | POA: Diagnosis not present

## 2016-05-16 ENCOUNTER — Ambulatory Visit: Payer: BC Managed Care – PPO | Admitting: Family Medicine

## 2016-05-16 ENCOUNTER — Institutional Professional Consult (permissible substitution): Payer: BC Managed Care – PPO | Admitting: Pulmonary Disease

## 2016-05-17 ENCOUNTER — Other Ambulatory Visit: Payer: Self-pay | Admitting: *Deleted

## 2016-05-17 ENCOUNTER — Telehealth: Payer: Self-pay | Admitting: Pulmonary Disease

## 2016-05-17 DIAGNOSIS — G4733 Obstructive sleep apnea (adult) (pediatric): Secondary | ICD-10-CM | POA: Diagnosis not present

## 2016-05-17 DIAGNOSIS — G4710 Hypersomnia, unspecified: Secondary | ICD-10-CM

## 2016-05-17 NOTE — Telephone Encounter (Signed)
  Please call the pt and tell the pt the HOME SLEEP STUDY  showed OSA   Pt stops breathing 35   times an hour.   Home sleep study was done on : 05/09/16  Ideally, my recommendation is to have a cpap/bipap titration study.  But, we can see if autocpap will work.   Please order autoCPAP 5-15 cm H2O. Patient will need a mask fitting session. Patient will need a 1 month download.   Patient needs to be seen by me or any of the NPs/APPs  4-6 weeks after obtaining the cpap machine. Let me know if you receive this.   Thanks!   J. Alexis FrockAngelo A de Dios, MD 05/17/2016, 3:53 AM

## 2016-05-19 NOTE — Telephone Encounter (Signed)
Spoke with pt about her sleep study results per AD. Pt agreed to the order being placed for cpap. The order was placed. Nothing further is needed. Pt follow up appointment will work. If needed she is aware that we will change it.

## 2016-05-19 NOTE — Telephone Encounter (Signed)
Patient stated she lost connection, she is returning phone call.

## 2016-05-30 ENCOUNTER — Ambulatory Visit: Payer: BC Managed Care – PPO | Admitting: Family Medicine

## 2016-07-06 ENCOUNTER — Ambulatory Visit (INDEPENDENT_AMBULATORY_CARE_PROVIDER_SITE_OTHER): Payer: BC Managed Care – PPO | Admitting: Pulmonary Disease

## 2016-07-06 ENCOUNTER — Encounter: Payer: Self-pay | Admitting: Pulmonary Disease

## 2016-07-06 DIAGNOSIS — G4733 Obstructive sleep apnea (adult) (pediatric): Secondary | ICD-10-CM | POA: Diagnosis not present

## 2016-07-06 NOTE — Assessment & Plan Note (Signed)
Pt has snoring, witnessed apneas, gasping, choking, tiredness, fatigue, frequent awakenings, unrefreshed sleep. She sleeps 8-10 hrs/night and feels unrefreshed.   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  By 3 pm, she gets really sleepy.   (+) sleep talking.  (-) other abnormal behavior in sleep.   She has depression and anxiety and is stable.   ESS 20. Very symptomatic.   HST 04/2016 with AHI 35.  On autocpap 5-15 cm water.  Good compliance and DL. Feels better using cpap, more energy, less sleepiness.  Feels benefit of cpap.  No issues with it.   Plan : We extensively discussed the importance of treating OSA and the need to use PAP therapy.   Continue with autocpap 5-15 cm water.  No issues. Has a nasal mask.    Patient was instructed to have mask, tubings, filter, reservoir cleaned at least once a week with soapy water.  Patient was instructed to call the office if he/she is having issues with the PAP device.    I advised patient to obtain sufficient amount of sleep --  7 to 8 hours at least in a 24 hr period.  Patient was advised to follow good sleep hygiene.  Patient was advised NOT to engage in activities requiring concentration and/or vigilance if he/she is and  sleepy.  Patient is NOT to drive if he/she is sleepy.

## 2016-07-06 NOTE — Patient Instructions (Signed)
  It was a pleasure taking care of you today!  Continue using your CPAP machine.   Please make sure you use your CPAP device everytime you sleep.  We will monitor the usage of your machine per your insurance requirement.  Your insurance company may take the machine from you if you are not using it regularly.   Please clean the mask, tubings, filter, water reservoir with soapy water every week.  Please use distilled water for the water reservoir.   Please call the office or your machine provider (DME company) if you are having issues with the device.   Return to clinic in 1 year    With  NP/APP

## 2016-07-06 NOTE — Progress Notes (Signed)
Subjective:    Patient ID: Nyoka Lint, female    DOB: 07/16/1975, 41 y.o.   MRN: [id number]  HPI   This is the case of Rosealyn E Cadiente, 41 y.o. Female, who was referred by Dr. Eustaquio Boyden in consultation regarding possible OSA.   As you very well know, patient is a non smoker, not known to have asthma or copd.  She has snoring, witnessed apneas, gasping, choking, tiredness, fatigue, frequent awakenings, unrefreshed sleep. She sleeps 8-10 hrs/night and feels unrefreshed.   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  By 3 pm, she gets really sleepy.   (+) sleep talking.  (-) other abnormal behavior in sleep.   She has depression and anxiety and both are stable.   ESS 20. Very symptomatic.   Her friends husbands have OSA and they use cpap.   ROV 07/06/2016  Pt returns to office for her OSA.  Since last seenm she had a HST on 04/2016 and her AHI was 35.  She was started on cpap machine.  Feels better using it.  More energy. Less sleepiness.  Feels benefit of cpap. DL last month :95%, AHI 3.2. No issues with it.   Review of Systems  Constitutional: Negative.  Negative for fever and unexpected weight change.  HENT: Negative.  Negative for congestion, dental problem, ear pain, nosebleeds, postnasal drip, rhinorrhea, sinus pressure, sneezing, sore throat and trouble swallowing.   Eyes: Negative.  Negative for redness and itching.  Respiratory: Negative.  Negative for cough, chest tightness, shortness of breath and wheezing.   Cardiovascular: Negative.  Negative for palpitations and leg swelling.  Gastrointestinal: Negative.  Negative for nausea and vomiting.  Endocrine: Negative.   Genitourinary: Negative.  Negative for dysuria.  Musculoskeletal: Negative.  Negative for joint swelling.  Skin: Negative.  Negative for rash.  Allergic/Immunologic: Positive for environmental allergies.  Neurological: Negative.  Negative for headaches.  Hematological: Does not bruise/bleed easily.    Psychiatric/Behavioral: Negative.  Negative for dysphoric mood. The patient is not nervous/anxious.       Objective:   Physical Exam  Vitals:  Vitals:   07/06/16 1617  BP: 120/82  Pulse: 75  SpO2: 97%  Weight: 232 lb 6.4 oz (105.4 kg)  Height:  (1.6 m)    Constitutional/General:  Pleasant, well-nourished, well-developed, not in any distress,  Comfortably seating.  Well kempt  Body mass index is 41.17 kg/m. Wt Readings from Last 3 Encounters:  07/06/16 232 lb 6.4 oz (105.4 kg)  04/26/16 233 lb 9.6 oz (106 kg)  04/07/16 232 lb 12 oz (105.6 kg)    HEENT: Pupils equal and reactive to light and accommodation. Anicteric sclerae. Normal nasal mucosa.   No oral  lesions,  mouth clear,  oropharynx clear, no postnasal drip. (-) Oral thrush. No dental caries.  Airway - Mallampati class IV.   Neck: No masses. Midline trachea. No JVD, (-) LAD. (-) bruits appreciated.  Respiratory/Chest: Grossly normal chest. (-) deformity. (-) Accessory muscle use.  Symmetric expansion. (-) Tenderness on palpation.  Resonant on percussion.  Diminished BS on both lower lung zones. (-) wheezing, crackles, rhonchi (-) egophony  Cardiovascular: Regular rate and  rhythm, heart sounds normal, no murmur or gallops, no peripheral edema  Gastrointestinal:  Normal bowel sounds. Soft, non-tender. No hepatosplenomegaly.  (-) masses.   Musculoskeletal:  Normal muscle tone. Normal gait.   Extremities: Grossly normal. (-) clubbing, cyanosis.  (-) edema  Skin: (-) rash,lesions seen.   Neurological/Psychiatric : alert, oriented to  time, place, person. Normal mood and affect          Assessment & Plan:  OSA (obstructive sleep apnea) Pt has snoring, witnessed apneas, gasping, choking, tiredness, fatigue, frequent awakenings, unrefreshed sleep. She sleeps 8-10 hrs/night and feels unrefreshed.   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  By 3 pm, she gets really sleepy.   (+) sleep talking.  (-) other  abnormal behavior in sleep.   She has depression and anxiety and is stable.   ESS 20. Very symptomatic.   HST 04/2016 with AHI 35.  On autocpap 5-15 cm water.  Good compliance and DL. Feels better using cpap, more energy, less sleepiness.  Feels benefit of cpap.  No issues with it.   Plan : We extensively discussed the importance of treating OSA and the need to use PAP therapy.   Continue with autocpap 5-15 cm water.  No issues. Has a nasal mask.    Patient was instructed to have mask, tubings, filter, reservoir cleaned at least once a week with soapy water.  Patient was instructed to call the office if he/she is having issues with the PAP device.    I advised patient to obtain sufficient amount of sleep --  7 to 8 hours at least in a 24 hr period.  Patient was advised to follow good sleep hygiene.  Patient was advised NOT to engage in activities requiring concentration and/or vigilance if he/she is and  sleepy.  Patient is NOT to drive if he/she is sleepy.    F/U in 1 yr.    Pollie Meyer, MD 07/06/2016   10:41 PM Pulmonary and Critical Care Medicine [facility] Pager: [phone] Office: [phone], Fax: [phone]

## 2016-08-28 ENCOUNTER — Other Ambulatory Visit: Payer: Self-pay | Admitting: Family Medicine

## 2016-10-09 ENCOUNTER — Other Ambulatory Visit: Payer: Self-pay | Admitting: Obstetrics & Gynecology

## 2016-10-09 DIAGNOSIS — F32A Depression, unspecified: Secondary | ICD-10-CM

## 2016-10-09 DIAGNOSIS — F329 Major depressive disorder, single episode, unspecified: Secondary | ICD-10-CM

## 2016-11-29 ENCOUNTER — Telehealth: Payer: Self-pay

## 2016-11-29 NOTE — Telephone Encounter (Signed)
Pt left v/m 4:56 pm concerned about getting refill on synthroid with in 2 days. I called pt back and she said she had spoken with [facility] and was advised [facility] to [facility]. [facility]. Nothing further needed.

## 2016-12-22 ENCOUNTER — Encounter: Payer: Self-pay | Admitting: Family Medicine

## 2017-02-07 ENCOUNTER — Encounter: Payer: Self-pay | Admitting: Obstetrics and Gynecology

## 2017-02-07 ENCOUNTER — Ambulatory Visit (INDEPENDENT_AMBULATORY_CARE_PROVIDER_SITE_OTHER): Payer: BC Managed Care – PPO | Admitting: Obstetrics and Gynecology

## 2017-02-07 VITALS — BP 122/90 | HR 90 | Wt 226.1 lb

## 2017-02-07 DIAGNOSIS — Z01419 Encounter for gynecological examination (general) (routine) without abnormal findings: Secondary | ICD-10-CM

## 2017-02-07 DIAGNOSIS — Z1151 Encounter for screening for human papillomavirus (HPV): Secondary | ICD-10-CM | POA: Diagnosis not present

## 2017-02-07 DIAGNOSIS — Z124 Encounter for screening for malignant neoplasm of cervix: Secondary | ICD-10-CM

## 2017-02-07 MED ORDER — VENLAFAXINE HCL ER 75 MG PO CP24: 150.0000 mg | ORAL_CAPSULE | Freq: Every day | ORAL | 6 refills | Status: DC

## 2017-02-07 NOTE — Progress Notes (Signed)
Last pap 2017 Discuss Effexor

## 2017-02-07 NOTE — Progress Notes (Signed)
Subjective:     Tamara Shaffer is a 41 y.o. female G2P2 with BMI 40 who is here for a comprehensive physical exam. The patient reports no problems. She is sexually active without complaints. She is using Mirena IUD for contraception. She has been amenorrheic with IUD. Patient is without complaints. Patient denies any pelvic pain or abnormal discharge. Patient admits to worsening urinary incontinence since gaining 25 lb. She also admits to increasing her caffeine intake. Patient reports feeling more depressed since the death of two close friends. She is requesting an increase in her Effexor.  Past Medical History:  Diagnosis Date  . Allergic rhinitis    seasonal  . GDM (gestational diabetes mellitus)   . GERD (gastroesophageal reflux disease)   . Hypothyroidism    medically treated, likely initially thyroiditis (heterogeneous gland on US 05/2015)  . Insomnia   . 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 Obesity   . Prediabetes    A1c 5.8% 11/2010  . Urinary incontinence   . Vulvar burning    Past Surgical History:  Procedure Laterality Date  . BREAST SURGERY  06/1994   Reconstruction  . CHOLECYSTECTOMY    . ECTOPIC PREGNANCY SURGERY  10/2008   Tube remained  . thyroid us  06/2010   heterogeneous normal sized thyroid gland consistent with chronic thyroiditis   Family History  Problem Relation Age of Onset  . Cancer Mother        leiomyosarcoma of abdomen  . Hypertension Father   . Heart disease Father        Porcine Valve Replacement  . Cancer Maternal Grandmother        Ovarian cancer  . Cancer Cousin        Thyroid cancer  . Diabetes Neg Hx   . Stroke Neg Hx     Social History   Socioeconomic History  . Marital status: Married    Spouse name: Not on file  . Number of children: 2  . Years of education: Not on file  . Highest education level: Not on file  Social Needs  . Financial resource strain: Not on file  . Food  insecurity - worry: Not on file  . Food insecurity - inability: Not on file  . Transportation needs - medical: Not on file  . Transportation needs - non-medical: Not on file  Occupational History  . Occupation: Surveyor, miningTeaches school, 2nd grade    Employer: [employer]  Tobacco Use  . Smoking status: Never Smoker  . Smokeless tobacco: Never Used  Substance and Sexual Activity  . Alcohol use: Yes    Comment: Socially  . Drug use: No  . Sexual activity: Yes    Partners: Male    Birth control/protection: Condom  Other Topics Concern  . Not on file  Social History Narrative   Caffeine:  1 cup daily   Health Maintenance  Topic Date Due  . HIV Screening  03/22/1991  . DTaP/Tdap/Td (1 - Tdap) 03/22/1995  . TETANUS/TDAP  03/22/1995  . INFLUENZA VACCINE  10/18/2016  . PAP SMEAR  10/04/2018     Review of Systems Pertinent items are noted in HPI.   Objective:  Blood pressure 122/90, pulse 90, weight 226 lb 1.6 oz (102.6 kg).     GENERAL: Well-developed, well-nourished female in no acute distress.  HEENT: Normocephalic, atraumatic. Sclerae anicteric.  NECK: Supple. Normal thyroid.  LUNGS: Clear to auscultation bilaterally.  HEART: Regular  rate and rhythm. BREASTS: Symmetric in size. No palpable masses or lymphadenopathy, skin changes, or nipple drainage. ABDOMEN: Soft, nontender, nondistended. Obese PELVIC: Normal external female genitalia. Vagina is pink and rugated.  Normal discharge. Normal appearing cervix, IUD strings visualized extending from os. Uterus is normal in size. No adnexal mass or tenderness. EXTREMITIES: No cyanosis, clubbing, or edema, 2+ distal pulses.    Assessment:    Healthy female exam.      Plan:    pap smear collected Screening mammogram ordered Effexor increased to 150 mg daily Patient will be contacted with abnormal results  See After Visit Summary for Counseling Recommendations

## 2017-02-12 LAB — CYTOLOGY - PAP
Diagnosis: NEGATIVE
HPV: NOT DETECTED

## 2017-02-15 ENCOUNTER — Encounter: Payer: Self-pay | Admitting: Radiology

## 2017-03-05 ENCOUNTER — Telehealth: Payer: Self-pay | Admitting: *Deleted

## 2017-03-05 ENCOUNTER — Other Ambulatory Visit: Payer: Self-pay | Admitting: Obstetrics and Gynecology

## 2017-03-05 NOTE — Telephone Encounter (Signed)
-----   Message from Lindell SparHeather L Bacon, [place] sent at 03/01/2017  9:13 AM EST ----- Regarding: question about medication dosage Contact: [phone] Patient saw Constant in Nov, talked about changing dosage to anxiety med, [facility] [place] same dosage, pt wants to know if that was correct? Pt wanted the dosage increased or new medication, but same sent into pharmacy. States that Dr Jolayne Pantheronstant was fine with the increase please research

## 2017-03-08 NOTE — Telephone Encounter (Signed)
Pt is on max dose per Dr Jolayne Pantheronstant. LM for pt to rtn call to advise.

## 2017-04-05 ENCOUNTER — Ambulatory Visit
Admission: RE | Admit: 2017-04-05 | Discharge: 2017-04-05 | Disposition: A | Discharge status: Home / Self Care | Payer: BC Managed Care – PPO | Source: Ambulatory Visit | Attending: Obstetrics and Gynecology | Admitting: Obstetrics and Gynecology

## 2017-04-05 DIAGNOSIS — Z01419 Encounter for gynecological examination (general) (routine) without abnormal findings: Secondary | ICD-10-CM

## 2017-04-05 DIAGNOSIS — Z1231 Encounter for screening mammogram for malignant neoplasm of breast: Secondary | ICD-10-CM | POA: Diagnosis present

## 2017-05-04 ENCOUNTER — Encounter: Payer: Self-pay | Admitting: *Deleted

## 2017-05-04 ENCOUNTER — Ambulatory Visit (INDEPENDENT_AMBULATORY_CARE_PROVIDER_SITE_OTHER): Payer: BC Managed Care – PPO | Admitting: Physician Assistant

## 2017-05-04 ENCOUNTER — Encounter: Payer: Self-pay | Admitting: Physician Assistant

## 2017-05-04 ENCOUNTER — Encounter: Payer: BC Managed Care – PPO | Admitting: Physician Assistant

## 2017-05-04 DIAGNOSIS — G43009 Migraine without aura, not intractable, without status migrainosus: Secondary | ICD-10-CM

## 2017-05-04 DIAGNOSIS — G43911 Migraine, unspecified, intractable, with status migrainosus: Secondary | ICD-10-CM

## 2017-05-04 DIAGNOSIS — F329 Major depressive disorder, single episode, unspecified: Secondary | ICD-10-CM | POA: Diagnosis not present

## 2017-05-04 DIAGNOSIS — F32A Depression, unspecified: Secondary | ICD-10-CM

## 2017-05-04 MED ORDER — CYCLOBENZAPRINE HCL 10 MG PO TABS: 10.0000 mg | ORAL_TABLET | Freq: Three times a day (TID) | ORAL | 1 refills | Status: DC | PRN

## 2017-05-04 MED ORDER — PROMETHAZINE HCL 25 MG PO TABS: 25.0000 mg | ORAL_TABLET | Freq: Four times a day (QID) | ORAL | 1 refills | Status: DC | PRN

## 2017-05-04 MED ORDER — VENLAFAXINE HCL ER 75 MG PO CP24: 75.0000 mg | ORAL_CAPSULE | Freq: Three times a day (TID) | ORAL | 3 refills | Status: DC

## 2017-05-04 MED ORDER — SUMATRIPTAN SUCCINATE 100 MG PO TABS: 100.0000 mg | ORAL_TABLET | Freq: Once | ORAL | 4 refills | Status: DC | PRN

## 2017-05-04 MED ORDER — OMEPRAZOLE 20 MG PO CPDR: 20.0000 mg | DELAYED_RELEASE_CAPSULE | Freq: Every day | ORAL | 3 refills | Status: DC

## 2017-05-04 NOTE — Patient Instructions (Signed)
Stress and Stress Management Stress is a normal reaction to life events. It is what you feel when life demands more than you are used to or more than you can handle. Some stress can be useful. For example, the stress reaction can help you catch the last bus of the day, study for a test, or meet a deadline at work. But stress that occurs too often or for too long can cause problems. It can affect your emotional health and interfere with relationships and normal daily activities. Too much stress can weaken your immune system and increase your risk for physical illness. If you already have a medical problem, stress can make it worse. What are the causes? All sorts of life events may cause stress. An event that causes stress for one person may not be stressful for another person. Major life events commonly cause stress. These may be positive or negative. Examples include losing your job, moving into a new home, getting married, having a baby, or losing a loved one. Less obvious life events may also cause stress, especially if they occur day after day or in combination. Examples include working long hours, driving in traffic, caring for children, being in debt, or being in a difficult relationship. What are the signs or symptoms? Stress may cause emotional symptoms including, the following:  Anxiety. This is feeling worried, afraid, on edge, overwhelmed, or out of control.  Anger. This is feeling irritated or impatient.  Depression. This is feeling sad, down, helpless, or guilty.  Difficulty focusing, remembering, or making decisions.  Stress may cause physical symptoms, including the following:  Aches and pains. These may affect your head, neck, back, stomach, or other areas of your body.  Tight muscles or clenched jaw.  Low energy or trouble sleeping.  Stress may cause unhealthy behaviors, including the following:  Eating to feel better (overeating) or skipping meals.  Sleeping too little,  too much, or both.  Working too much or putting off tasks (procrastination).  Smoking, drinking alcohol, or using drugs to feel better.  How is this diagnosed? Stress is diagnosed through an assessment by your health care provider. Your health care provider will ask questions about your symptoms and any stressful life events.Your health care provider will also ask about your medical history and may order blood tests or other tests. Certain medical conditions and medicine can cause physical symptoms similar to stress. Mental illness can cause emotional symptoms and unhealthy behaviors similar to stress. Your health care provider may refer you to a mental health professional for further evaluation. How is this treated? Stress management is the recommended treatment for stress.The goals of stress management are reducing stressful life events and coping with stress in healthy ways. Techniques for reducing stressful life events include the following:  Stress identification. Self-monitor for stress and identify what causes stress for you. These skills may help you to avoid some stressful events.  Time management. Set your priorities, keep a calendar of events, and learn to say "no." These tools can help you avoid making too many commitments.  Techniques for coping with stress include the following:  Rethinking the problem. Try to think realistically about stressful events rather than ignoring them or overreacting. Try to find the positives in a stressful situation rather than focusing on the negatives.  Exercise. Physical exercise can release both physical and emotional tension. The key is to find a form of exercise you enjoy and do it regularly.  Relaxation techniques. These relax the body and  mind. Examples include yoga, meditation, tai chi, biofeedback, deep breathing, progressive muscle relaxation, listening to music, being out in nature, journaling, and other hobbies. Again, the key is to find  one or more that you enjoy and can do regularly.  Healthy lifestyle. Eat a balanced diet, get plenty of sleep, and do not smoke. Avoid using alcohol or drugs to relax.  Strong support network. Spend time with family, friends, or other people you enjoy being around.Express your feelings and talk things over with someone you trust.  Counseling or talktherapy with a mental health professional may be helpful if you are having difficulty managing stress on your own. Medicine is typically not recommended for the treatment of stress.Talk to your health care provider if you think you need medicine for symptoms of stress. Follow these instructions at home:  Keep all follow-up visits as directed by your health care provider.  Take all medicines as directed by your health care provider. Contact a health care provider if:  Your symptoms get worse or you start having new symptoms.  You feel overwhelmed by your problems and can no longer manage them on your own. Get help right away if:  You feel like hurting yourself or someone else. This information is not intended to replace advice given to you by your health care provider. Make sure you discuss any questions you have with your health care provider. Document Released: 08/30/2000 Document Revised: 08/12/2015 Document Reviewed: 10/29/2012 Elsevier Interactive Patient Education  2017 [employer]. Migraine Headache A migraine headache is a very strong throbbing pain on one side or both sides of your head. Migraines can also cause other symptoms. Talk with your doctor about what things may bring on (trigger) your migraine headaches. Follow these instructions at home: Medicines  Take over-the-counter and prescription medicines only as told by your doctor.  Do not drive or use heavy machinery while taking prescription pain medicine.  To prevent or treat constipation while you are taking prescription pain medicine, your doctor may recommend that  you: ? Drink enough fluid to keep your pee (urine) clear or pale yellow. ? Take over-the-counter or prescription medicines. ? Eat foods that are high in fiber. These include fresh fruits and vegetables, whole grains, and beans. ? Limit foods that are high in fat and processed sugars. These include fried and sweet foods. Lifestyle  Avoid alcohol.  Do not use any products that contain nicotine or tobacco, such as cigarettes and e-cigarettes. If you need help quitting, ask your doctor.  Get at least 8 hours of sleep every night.  Limit your stress. General instructions   Keep a journal to find out what may bring on your migraines. For example, write down: ? What you eat and drink. ? How much sleep you get. ? Any change in what you eat or drink. ? Any change in your medicines.  If you have a migraine: ? Avoid things that make your symptoms worse, such as bright lights. ? It may help to lie down in a dark, quiet room. ? Do not drive or use heavy machinery. ? Ask your doctor what activities are safe for you.  Keep all follow-up visits as told by your doctor. This is important. Contact a doctor if:  You get a migraine that is different or worse than your usual migraines. Get help right away if:  Your migraine gets very bad.  You have a fever.  You have a stiff neck.  You have trouble seeing.  Your muscles  feel weak or like you cannot control them.  You start to lose your balance a lot.  You start to have trouble walking.  You pass out (faint). This information is not intended to replace advice given to you by your health care provider. Make sure you discuss any questions you have with your health care provider. Document Released: 12/14/2007 Document Revised: 09/24/2015 Document Reviewed: 08/23/2015 Elsevier Interactive Patient Education  2018 [employer].

## 2017-05-04 NOTE — Progress Notes (Signed)
History:  Tamara Shaffer is a 42 y.o. G2P2 who presents to clinic today for annual headache eval.  She is extremely stressed.  She is waking every am with major jaw pain. This may contribute to HA.  She is using tylenol and ibuprofen regularly.   She has been using CPAP almost one year which has helped with life in general.  No change in her HA quality.    HIT6:55 Number of days in the last 4 weeks with:  Severe headache: 1 Moderate headache: 1 Mild headache: 1  No headache: 25   Past Medical History:  Diagnosis Date  . Allergic rhinitis    seasonal  . GDM (gestational diabetes mellitus)   . GERD (gastroesophageal reflux disease)   . Hypothyroidism    medically treated, likely initially thyroiditis (heterogeneous gland on [place] 05/2015)  . Insomnia   . 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 Obesity   . Prediabetes    A1c 5.8% 11/2010  . Urinary incontinence   . Vulvar burning     Social History   Socioeconomic History  . Marital status: Married    Spouse name: Not on file  . Number of children: 2  . Years of education: Not on file  . Highest education level: Not on file  Social Needs  . Financial resource strain: Not on file  . Food insecurity - worry: Not on file  . Food insecurity - inability: Not on file  . Transportation needs - medical: Not on file  . Transportation needs - non-medical: Not on file  Occupational History  . 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 Smoking status: Never Smoker  . Smokeless tobacco: Never Used  Substance and Sexual Activity  . Alcohol use: Yes    Comment: Socially  . Drug use: No  . Sexual activity: Yes    Partners: Male    Birth control/protection: Condom  Other Topics Concern  . Not on file  Social History Narrative   Caffeine:  1 cup daily    Family History  Problem Relation Age of Onset  . Cancer Mother    leiomyosarcoma of abdomen  . Hypertension Father   . Heart disease Father        Porcine Valve Replacement  . Cancer Maternal Grandmother        Ovarian cancer  . Cancer Cousin        Thyroid cancer  . Diabetes Neg Hx   . Stroke Neg Hx     Allergies  Allergen Reactions  . Erythromycin     REACTION: GI upset, rash, SOB  . Penicillins     REACTION: as child, ok with amoxicillin in past    Current Outpatient Medications on File Prior to Visit  Medication Sig Dispense Refill  . hydrochlorothiazide (HYDRODIURIL) 25 MG tablet Take 1 tablet (25 mg total) by mouth daily. 90 tablet 3  . loratadine (CLARITIN) 10 MG tablet Take 10 mg by mouth daily.    . Multiple Vitamin (MULTIVITAMIN) tablet Take 1 tablet by mouth daily.      Marland Kitchen omeprazole (PRILOSEC) 20 MG capsule TAKE 1 CAPSULE DAILY 90 capsule 3  . promethazine (PHENERGAN) 25 MG tablet Take 1 tablet (25 mg total) by mouth every 6 (six) hours as needed for nausea or vomiting. 30 tablet 1  . SUMAtriptan (IMITREX) 100 MG tablet Take 1 tablet (100 mg  total) by mouth once as needed for migraine. 27 tablet 4  . SYNTHROID 125 MCG tablet TAKE 1 TABLET DAILY BEFORE BREAKFAST 90 tablet 2  . venlafaxine XR (EFFEXOR-XR) 75 MG 24 hr capsule TAKE 1 CAPSULE (75 MG TOTAL) BY MOUTH 3 (THREE) TIMES DAILY. 180 capsule 5  . venlafaxine XR (EFFEXOR-XR) 75 MG 24 hr capsule Take 2 capsules (150 mg total) by mouth daily with breakfast. 60 capsule 6   Current Facility-Administered Medications on File Prior to Visit  Medication Dose Route Frequency Provider Last Rate Last Dose  . diclofenac (VOLTAREN) EC tablet 75 mg  75 mg Oral BID Reva BoresPratt, Tanya S, MD         Review of Systems:  All pertinent positive/negative included in HPI, all other review of systems are negative   Objective:  Physical Exam BP 126/70   Pulse 69   Ht 5\' 3"  (1.6 m)   Wt 235 lb (106.6 kg)   LMP 04/05/2017   BMI 41.63 kg/m  CONSTITUTIONAL: Well-developed, well-nourished female in no  acute distress.  EYES: EOM intact ENT: Normocephalic CARDIOVASCULAR: Regular rate   RESPIRATORY: Normal rate.  MUSCULOSKELETAL: Normal ROM muscle spasm noted SKIN: Warm, dry without erythema  NEUROLOGICAL: Alert, oriented, CN II-XII grossly intact, Appropriate balance PSYCH: Normal behavior, mood   Assessment & Plan:  Assessment: 1. Migraine without aura and without status migrainosus, not intractable   2. Depression   3. Intractable migraine with status migrainosus, unspecified migraine type   4. Severe obesity (BMI >= 40) (HCC) Chronic  Headaches are overall stable with some worsening related to jaw pain in the am.  Plan:  Add Flexeril in the evenings to help with jaw pain and headaches.   Continue imitrex for acute migraine Continue effexor without dosage change Encouraged, advised and motivated to do something to care for herself: exercise, medication, relaxation... Work towards weight loss Follow-up in 12 months or sooner PRN  Bertram Denvereague Clark, Karen E, PA-C 05/04/2017 10:00 AM

## 2017-05-07 ENCOUNTER — Other Ambulatory Visit: Payer: Self-pay

## 2017-05-07 MED ORDER — HYDROCHLOROTHIAZIDE 25 MG PO TABS: 25.0000 mg | ORAL_TABLET | Freq: Every day | ORAL | 1 refills | Status: DC

## 2017-05-07 NOTE — Telephone Encounter (Signed)
Refill on Hydrochlorothiazide 25 sent to [facility] pharmacy.

## 2017-05-25 NOTE — Progress Notes (Signed)
Err

## 2017-06-05 ENCOUNTER — Other Ambulatory Visit: Payer: Self-pay | Admitting: Family Medicine

## 2017-06-15 ENCOUNTER — Encounter: Payer: BC Managed Care – PPO | Admitting: Physician Assistant

## 2017-07-04 ENCOUNTER — Ambulatory Visit (INDEPENDENT_AMBULATORY_CARE_PROVIDER_SITE_OTHER): Payer: BC Managed Care – PPO | Admitting: Family Medicine

## 2017-07-04 ENCOUNTER — Encounter: Payer: Self-pay | Admitting: Family Medicine

## 2017-07-04 VITALS — BP 118/78 | HR 80 | Temp 98.3°F | Ht 63.0 in | Wt 239.2 lb

## 2017-07-04 DIAGNOSIS — R5383 Other fatigue: Secondary | ICD-10-CM | POA: Diagnosis not present

## 2017-07-04 DIAGNOSIS — R232 Flushing: Secondary | ICD-10-CM | POA: Diagnosis not present

## 2017-07-04 DIAGNOSIS — G4733 Obstructive sleep apnea (adult) (pediatric): Secondary | ICD-10-CM

## 2017-07-04 DIAGNOSIS — E02 Subclinical iodine-deficiency hypothyroidism: Secondary | ICD-10-CM | POA: Diagnosis not present

## 2017-07-04 DIAGNOSIS — E039 Hypothyroidism, unspecified: Secondary | ICD-10-CM | POA: Diagnosis not present

## 2017-07-04 NOTE — Patient Instructions (Signed)
Labs today to check for other causes of fatigue and hot flashes.  We will be in touch with results.

## 2017-07-04 NOTE — Progress Notes (Signed)
BP 118/78 (BP Location: Left Arm, Patient Position: Sitting, Cuff Size: Large)   Pulse 80   Temp 98.3 F (36.8 C) (Oral)   Ht 5\' 3"  (1.6 m)   Wt 239 lb 4 oz (108.5 kg)   LMP 06/24/2017   SpO2 97%   BMI 42.38 kg/m    CC: fatigue, hot flashes Subjective:    Patient ID: Tamara Shaffer, female    DOB: 12/14/1975, 42 y.o.   MRN: [id number]  HPI: Tamara Shaffer is a 42 y.o. female presenting on 07/04/2017 for Hot Flashes (Started more frequently several times a day, weekly.  Thinks it may be thyroid related. )   3 wk h/o increasing fatigue, hot flashes during the day, daytime sleepiness. Worsening migraines recently. 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 OSA on CPAP which was started 06/2016 - initially did better with this. Now noticing worsening daytime somnolence again.  No fevers/chills, hot or cold intolerance, skin or hair changes, diarrhea, constipation, palpitations. No night sweats. No abd pain, nausea/vomiting.   Continues omeprazole 20mg  daily for GERD - breakthrough symptoms if dose missed.   Hypothyroidism dx 2009 on brand synthroid since, compliant with 125mcg dose.   Weight gain over the past year - more recently working again on weight loss with resultant 10 lb weight loss since Christmas!  LMP - 2 wks ago - regular monthly with Mirena IUD  Lab Results  Component Value Date   TSH 1.470 04/07/2016   Relevant past medical, surgical, family and social history reviewed and updated as indicated. Interim medical history since our last visit reviewed. Allergies and medications reviewed and updated. Outpatient Medications Prior to Visit  Medication Sig Dispense Refill  . cyclobenzaprine (FLEXERIL) 10 MG tablet Take 1 tablet (10 mg total) by mouth 3 (three) times daily as needed for muscle spasms. 180 tablet 1  . hydrochlorothiazide (HYDRODIURIL) 25 MG tablet Take 1 tablet (25 mg total) by mouth daily. 90 tablet 1  . loratadine (CLARITIN) 10 MG tablet Take 10 mg by mouth  daily.    . Multiple Vitamin (MULTIVITAMIN) tablet Take 1 tablet by mouth daily.      Marland Kitchen. omeprazole (PRILOSEC) 20 MG capsule Take 1 capsule (20 mg total) by mouth daily. 90 capsule 3  . promethazine (PHENERGAN) 25 MG tablet Take 1 tablet (25 mg total) by mouth every 6 (six) hours as needed for nausea or vomiting. 30 tablet 1  . SUMAtriptan (IMITREX) 100 MG tablet Take 1 tablet (100 mg total) by mouth once as needed for migraine. 27 tablet 4  . SYNTHROID 125 MCG tablet TAKE 1 TABLET BY MOUTH DAILY BEFORE BREAKFAST 90 tablet 0  . venlafaxine XR (EFFEXOR-XR) 75 MG 24 hr capsule Take 1 capsule (75 mg total) by mouth 3 (three) times daily. 270 capsule 3   Facility-Administered Medications Prior to Visit  Medication Dose Route Frequency Provider Last Rate Last Dose  . diclofenac (VOLTAREN) EC tablet 75 mg  75 mg Oral BID Reva BoresPratt, Tanya S, MD         Per HPI unless specifically indicated in ROS section below Review of Systems     Objective:    BP 118/78 (BP Location: Left Arm, Patient Position: Sitting, Cuff Size: Large)   Pulse 80   Temp 98.3 F (36.8 C) (Oral)   Ht 5\' 3"  (1.6 m)   Wt 239 lb 4 oz (108.5 kg)   LMP 06/24/2017   SpO2 97%   BMI 42.38 kg/m  Wt Readings from Last 3 Encounters:  07/04/17 239 lb 4 oz (108.5 kg)  05/04/17 235 lb (106.6 kg)  02/07/17 226 lb 1.6 oz (102.6 kg)    Physical Exam  Constitutional: She appears well-developed and well-nourished. No distress.  HENT:  Head: Normocephalic and atraumatic.  Mouth/Throat: Oropharynx is clear and moist. No oropharyngeal exudate.  Eyes: Pupils are equal, round, and reactive to light. Conjunctivae and EOM are normal.  Neck: Normal range of motion. Neck supple. No thyromegaly present.  Cardiovascular: Normal rate, regular rhythm and normal heart sounds.  No murmur heard. Pulmonary/Chest: Effort normal and breath sounds normal. No respiratory distress. She has no wheezes. She has no rales.  Musculoskeletal: She exhibits no  edema.  Skin: No rash noted.  Psychiatric: She has a normal mood and affect.  Nursing note and vitals reviewed.      Assessment & Plan:   Problem List Items Addressed This Visit    Fatigue - Primary    Possibly related to increasing work stressors as end of year approaches. Will check for reversible causes of fatigue.       Relevant Orders   TSH   Comprehensive metabolic panel   T4, free   CBC with Differential/Platelet   Vitamin B12   VITAMIN D 25 Hydroxy (Vit-D Deficiency, Fractures)   Hot flashes    Premenopausal. Check labs today.       Hypothyroidism    Reports compliance with brand synthroid daily. Update TSH, free T4.       Relevant Orders   TSH   T4, free   OSA (obstructive sleep apnea)    Significant improvement in daytime somnolence since CPAP started           No orders of the defined types were placed in this encounter.  Orders Placed This Encounter  Procedures  . TSH  . Comprehensive metabolic panel  . T4, free  . CBC with Differential/Platelet  . Vitamin B12  . VITAMIN D 25 Hydroxy (Vit-D Deficiency, Fractures)    Follow up plan: Return if symptoms worsen or fail to improve.  Eustaquio Boyden, MD

## 2017-07-04 NOTE — Assessment & Plan Note (Addendum)
Reports compliance with brand synthroid daily. Update TSH, free T4.

## 2017-07-04 NOTE — Assessment & Plan Note (Addendum)
Possibly related to increasing work stressors as end of year approaches. Will check for reversible causes of fatigue.

## 2017-07-04 NOTE — Assessment & Plan Note (Signed)
Premenopausal. Check labs today.

## 2017-07-04 NOTE — Assessment & Plan Note (Signed)
Significant improvement in daytime somnolence since CPAP started

## 2017-07-05 LAB — CBC WITH DIFFERENTIAL/PLATELET
BASOS ABS: 0.1 10*3/uL (ref 0.0–0.1)
Basophils Relative: 0.9 % (ref 0.0–3.0)
EOS ABS: 0.4 10*3/uL (ref 0.0–0.7)
Eosinophils Relative: 2.7 % (ref 0.0–5.0)
HCT: 38.4 % (ref 36.0–46.0)
Hemoglobin: 12.6 g/dL (ref 12.0–15.0)
Lymphocytes Relative: 26.8 % (ref 12.0–46.0)
Lymphs Abs: 3.5 10*3/uL (ref 0.7–4.0)
MCHC: 32.9 g/dL (ref 30.0–36.0)
MCV: 80.3 fl (ref 78.0–100.0)
MONO ABS: 0.8 10*3/uL (ref 0.1–1.0)
MONOS PCT: 5.9 % (ref 3.0–12.0)
Neutro Abs: 8.3 10*3/uL — ABNORMAL HIGH (ref 1.4–7.7)
Neutrophils Relative %: 63.7 % (ref 43.0–77.0)
Platelets: 312 10*3/uL (ref 150.0–400.0)
RBC: 4.78 Mil/uL (ref 3.87–5.11)
RDW: 15.1 % (ref 11.5–15.5)
WBC: 13 10*3/uL — ABNORMAL HIGH (ref 4.0–10.5)

## 2017-07-05 LAB — COMPREHENSIVE METABOLIC PANEL
ALBUMIN: 4.2 g/dL (ref 3.5–5.2)
ALT: 34 U/L (ref 0–35)
AST: 21 U/L (ref 0–37)
Alkaline Phosphatase: 60 U/L (ref 39–117)
BUN: 14 mg/dL (ref 6–23)
CHLORIDE: 97 meq/L (ref 96–112)
CO2: 30 mEq/L (ref 19–32)
CREATININE: 0.93 mg/dL (ref 0.40–1.20)
Calcium: 9.4 mg/dL (ref 8.4–10.5)
GFR: 70.51 mL/min (ref >60.00)
GLUCOSE: 96 mg/dL (ref 70–99)
POTASSIUM: 3.5 meq/L (ref 3.5–5.1)
SODIUM: 136 meq/L (ref 135–145)
Total Bilirubin: 0.3 mg/dL (ref 0.2–1.2)
Total Protein: 7.8 g/dL (ref 6.0–8.3)

## 2017-07-05 LAB — T4, FREE: FREE T4: 0.82 ng/dL (ref 0.60–1.60)

## 2017-07-05 LAB — TSH: TSH: 1.53 u[IU]/mL (ref 0.35–4.50)

## 2017-07-05 LAB — VITAMIN D 25 HYDROXY (VIT D DEFICIENCY, FRACTURES): VITD: 20.95 ng/mL — AB (ref 30.00–100.00)

## 2017-07-05 LAB — VITAMIN B12: VITAMIN B 12: 626 pg/mL (ref 211–911)

## 2017-07-07 ENCOUNTER — Other Ambulatory Visit: Payer: Self-pay | Admitting: Family Medicine

## 2017-07-07 MED ORDER — VITAMIN D 50 MCG (2000 UT) PO CAPS: 1.0000 | ORAL_CAPSULE | Freq: Every day | ORAL | Status: AC

## 2017-07-20 ENCOUNTER — Encounter: Payer: Self-pay | Admitting: Family Medicine

## 2017-07-23 ENCOUNTER — Other Ambulatory Visit: Payer: Self-pay

## 2017-07-23 ENCOUNTER — Encounter: Payer: Self-pay | Admitting: Family Medicine

## 2017-07-23 ENCOUNTER — Ambulatory Visit: Payer: BC Managed Care – PPO | Admitting: Family Medicine

## 2017-07-23 VITALS — BP 130/80 | HR 99 | Temp 98.9°F | Ht 63.0 in | Wt 239.8 lb

## 2017-07-23 DIAGNOSIS — M545 Low back pain, unspecified: Secondary | ICD-10-CM

## 2017-07-23 DIAGNOSIS — J029 Acute pharyngitis, unspecified: Secondary | ICD-10-CM | POA: Diagnosis not present

## 2017-07-23 LAB — POC URINALSYSI DIPSTICK (AUTOMATED)
Bilirubin, UA: NEGATIVE
GLUCOSE UA: NEGATIVE
Ketones, UA: NEGATIVE
Leukocytes, UA: NEGATIVE
NITRITE UA: NEGATIVE
PH UA: 6 (ref 5.0–8.0)
Protein, UA: NEGATIVE
Urobilinogen, UA: 0.2 E.U./dL

## 2017-07-23 LAB — POCT RAPID STREP A (OFFICE): RAPID STREP A SCREEN: NEGATIVE

## 2017-07-23 NOTE — Progress Notes (Signed)
Dr. Karleen Hampshire T. Soma Lizak, MD, CAQ Sports [facility] Sports [facility] [address] [place], [address] Phone: [phone] Fax: [phone]  07/23/2017  Patient: Tamara Shaffer, MRN: [id number], DOB: 1975/05/02, 42 y.o.  Primary Physician:  Eustaquio Boyden, MD   Chief Complaint  Patient presents with  . Sore Throat  . Follow-up    Dr. Reece Agar visit 07/04/2017   Subjective:   Tamara Shaffer is a 42 y.o. very pleasant female patient who presents with the following:  LBP and ST: Patient came in for evaluation.  She is a third grade teacher, and she is having some low back pain in the region of L4-5 /L5-S1 and to a lesser extent some SI joints.  She became worried that she might possibly have a kidney infection, so she came in today for evaluation.  She also is having a sore throat, she wanted to make sure that she does not have strep throat.  She also has some runny nose and other URI type symptoms.  Is not having any numbness or tingling or any radicular symptoms in her back or down her legs.  Past Medical History, Surgical History, Social History, Family History, Problem List, Medications, and Allergies have been reviewed and updated if relevant.  Patient Active Problem List   Diagnosis Date Noted  . Hot flashes 07/04/2017  . OSA (obstructive sleep apnea) 04/07/2016  . Menorrhagia 01/28/2016  . Right shoulder pain 05/19/2015  . Anxiety 02/24/2014  . Migraine without aura 12/10/2012  . Elevated BP 12/10/2012  . Severe obesity (BMI >= 40) (HCC) 08/06/2012  . Sacroiliitis (HCC) 10/18/2011  . Anemia 09/02/2010  . Fatigue 07/12/2010  . ALLERGIC RHINITIS CAUSE UNSPECIFIED 05/27/2010  . AMENORRHEA 05/19/2010  . Hypothyroidism 04/14/2010  . STRESS INCONTINENCE 04/14/2010  . GERD (gastroesophageal reflux disease) 10/16/2007  . DEPRESSION 06/05/2007    Past Medical History:  Diagnosis Date  . Allergic rhinitis    seasonal  . GDM (gestational diabetes  mellitus)   . GERD (gastroesophageal reflux disease)   . Hypothyroidism    medically treated, likely initially thyroiditis (heterogeneous gland on [place] 05/2015)  . Insomnia   . 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 Obesity   . Prediabetes    A1c 5.8% 11/2010  . Urinary incontinence   . Vulvar burning     Past Surgical History:  Procedure Laterality Date  . BREAST SURGERY  06/1994   Reconstruction  . CHOLECYSTECTOMY    . ECTOPIC PREGNANCY SURGERY  10/2008   Tube remained  . REDUCTION MAMMAPLASTY Left 1996  . thyroid US  06/2010   heterogeneous normal sized thyroid gland consistent with chronic thyroiditis    Social History   Socioeconomic History  . Marital status: Married    Spouse name: Not on file  . Number of children: 2  . Years of education: Not on file  . Highest education level: Not on file  Occupational History  . 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 Financial resource strain: Not on file  . Food insecurity:    Worry: Not on file    Inability: Not on file  . Transportation needs:    Medical: Not on file    Non-medical: Not on file  Tobacco Use  . Smoking status: Never Smoker  . Smokeless tobacco: Never Used  Substance and Sexual Activity  . Alcohol use:  Yes    Comment: Socially  . Drug use: No  . Sexual activity: Yes    Partners: Male    Birth control/protection: Condom  Lifestyle  . Physical activity:    Days per week: Not on file    Minutes per session: Not on file  . Stress: Not on file  Relationships  . Social connections:    Talks on phone: Not on file    Gets together: Not on file    Attends religious service: Not on file    Active member of club or organization: Not on file    Attends meetings of clubs or organizations: Not on file    Relationship status: Not on file  . Intimate partner violence:    Fear of current or ex partner: Not on  file    Emotionally abused: Not on file    Physically abused: Not on file    Forced sexual activity: Not on file  Other Topics Concern  . Not on file  Social History Narrative   Caffeine:  1 cup daily    Family History  Problem Relation Age of Onset  . Cancer Mother        leiomyosarcoma of abdomen  . Hypertension Father   . Heart disease Father        Porcine Valve Replacement  . Cancer Maternal Grandmother        Ovarian cancer  . Cancer Cousin        Thyroid cancer  . Diabetes Neg Hx   . Stroke Neg Hx     Allergies  Allergen Reactions  . Erythromycin     REACTION: GI upset, rash, SOB  . Penicillins     REACTION: as child, ok with amoxicillin in past    Medication list reviewed and updated in full in  Link.  GEN: No fevers, chills. Nontoxic. Primarily MSK c/o today. MSK: Detailed in the HPI GI: tolerating PO intake without difficulty Neuro: No numbness, parasthesias, or tingling associated. Otherwise the pertinent positives of the ROS are noted above.   Objective:   BP 130/80   Pulse 99   Temp 98.9 F (37.2 C) (Oral)   Ht  (1.6 m)   Wt 239 lb 12 oz (108.7 kg)   LMP 06/24/2017   BMI 42.47 kg/m    GEN: Well-developed,well-nourished,in no acute distress; alert,appropriate and cooperative throughout examination HEENT: Normocephalic and atraumatic without obvious abnormalities. Ears, externally no deformities PULM: Breathing comfortably in no respiratory distress EXT: No clubbing, cyanosis, or edema PSYCH: Normally interactive. Cooperative during the interview. Pleasant. Friendly and conversant. Not anxious or depressed appearing. Normal, full affect.  Range of motion at  the waist: Flexion: normal Extension: normal Lateral bending: normal Rotation: all normal  No echymosis or edema Rises to examination table with no difficulty Gait: non antalgic  Inspection/Deformity: N Paraspinus Tenderness: L5-s1 b  B Ankle Dorsiflexion (L5,4):  5/5 B Great Toe Dorsiflexion (L5,4): 5/5 Heel Walk (L5): WNL Toe Walk (S1): WNL Rise/Squat (L4): WNL  SENSORY B Medial Foot (L4): WNL B Dorsum (L5): WNL B Lateral (S1): WNL Light Touch: WNL Pinprick: WNL  REFLEXES Knee (L4): 2+ Ankle (S1): 2+  B SLR, seated: neg B SLR, supine: neg B FABER: neg B Reverse FABER: neg B Greater Troch: NT B Log Roll: neg B Stork: NT B Sciatic Notch: ttp  Radiology: Results for orders placed or performed in visit on 07/23/17  POCT Urinalysis Dipstick (Automated)  Result Value Ref Range  Color, UA yellow    Clarity, UA clear    Glucose, UA negative    Bilirubin, UA negative    Ketones, UA negative    Spec Grav, UA >=1.030 (A) 1.010 - 1.025   Blood, UA trace    pH, UA 6.0 5.0 - 8.0   Protein, UA negative    Urobilinogen, UA 0.2 0.2 or 1.0 E.U./dL   Nitrite, UA negative    Leukocytes, UA Negative Negative  POCT rapid strep A  Result Value Ref Range   Rapid Strep A Screen Negative Negative     Assessment and Plan:   Acute bilateral low back pain without sciatica - Plan: POCT Urinalysis Dipstick (Automated)  Sore throat - Plan: POCT rapid strep A  Classic mechanical back pain. Body mass index is 42.47 kg/m.  A rehabilitation program from the [employer] was reviewed with the patient face to face for their condition.  She is also been to work on losing weight.  Likely viral sore throat.  Follow-up: No follow-ups on file.  Orders Placed This Encounter  Procedures  . POCT Urinalysis Dipstick (Automated)  . POCT rapid strep A    Signed,  Idania Desouza T. Latash Nouri, MD   Allergies as of 07/23/2017      Reactions   Erythromycin    REACTION: GI upset, rash, SOB   Penicillins    REACTION: as child, ok with amoxicillin in past      Medication List        Accurate as of 07/23/17 11:59 PM. Always use your most recent med list.          cyclobenzaprine 10 MG tablet Commonly known as:  FLEXERIL Take 1  tablet (10 mg total) by mouth 3 (three) times daily as needed for muscle spasms.   hydrochlorothiazide 25 MG tablet Commonly known as:  HYDRODIURIL Take 1 tablet (25 mg total) by mouth daily.   loratadine 10 MG tablet Commonly known as:  CLARITIN Take 10 mg by mouth daily.   multivitamin tablet Take 1 tablet by mouth daily.   omeprazole 20 MG capsule Commonly known as:  PRILOSEC Take 1 capsule (20 mg total) by mouth daily.   promethazine 25 MG tablet Commonly known as:  PHENERGAN Take 1 tablet (25 mg total) by mouth every 6 (six) hours as needed for nausea or vomiting.   SUMAtriptan 100 MG tablet Commonly known as:  IMITREX Take 1 tablet (100 mg total) by mouth once as needed for migraine.   SYNTHROID 125 MCG tablet Generic drug:  levothyroxine TAKE 1 TABLET BY MOUTH DAILY BEFORE BREAKFAST   venlafaxine XR 75 MG 24 hr capsule Commonly known as:  EFFEXOR-XR Take 1 capsule (75 mg total) by mouth 3 (three) times daily.   Vitamin D 2000 units Caps Take 1 capsule (2,000 Units total) by mouth daily.

## 2017-09-03 ENCOUNTER — Other Ambulatory Visit: Payer: Self-pay | Admitting: Family Medicine

## 2017-10-11 ENCOUNTER — Other Ambulatory Visit: Payer: Self-pay

## 2017-10-11 ENCOUNTER — Telehealth: Payer: Self-pay | Admitting: Obstetrics & Gynecology

## 2017-10-11 DIAGNOSIS — F329 Major depressive disorder, single episode, unspecified: Secondary | ICD-10-CM

## 2017-10-11 DIAGNOSIS — F32A Depression, unspecified: Secondary | ICD-10-CM

## 2017-10-11 MED ORDER — VENLAFAXINE HCL ER 75 MG PO CP24: 75.0000 mg | ORAL_CAPSULE | Freq: Three times a day (TID) | ORAL | 0 refills | Status: DC

## 2017-10-11 NOTE — Telephone Encounter (Signed)
Patient is currently on effexor xr for headaches.Call in another refill. Patient last appointment was 04/2017 advised to continued effexor xr

## 2017-10-11 NOTE — Telephone Encounter (Signed)
Pt has one dose for tomorrow EFFEXOR. No refills remain. Please call in or she will be without medication after tomorrow. Call pt mobile. Pt uses [facility].

## 2017-10-31 ENCOUNTER — Ambulatory Visit (INDEPENDENT_AMBULATORY_CARE_PROVIDER_SITE_OTHER)
Admission: RE | Admit: 2017-10-31 | Discharge: 2017-10-31 | Disposition: A | Discharge status: Home / Self Care | Payer: BC Managed Care – PPO | Source: Ambulatory Visit | Attending: Family Medicine | Admitting: Family Medicine

## 2017-10-31 ENCOUNTER — Ambulatory Visit: Payer: BC Managed Care – PPO | Admitting: Family Medicine

## 2017-10-31 ENCOUNTER — Encounter: Payer: Self-pay | Admitting: Family Medicine

## 2017-10-31 VITALS — BP 138/80 | HR 86 | Temp 98.6°F | Ht 63.0 in | Wt 243.8 lb

## 2017-10-31 DIAGNOSIS — M545 Low back pain, unspecified: Secondary | ICD-10-CM

## 2017-10-31 DIAGNOSIS — G8929 Other chronic pain: Secondary | ICD-10-CM | POA: Diagnosis not present

## 2017-10-31 DIAGNOSIS — M533 Sacrococcygeal disorders, not elsewhere classified: Secondary | ICD-10-CM

## 2017-10-31 DIAGNOSIS — Z6841 Body Mass Index (BMI) 40.0 and over, adult: Secondary | ICD-10-CM

## 2017-10-31 NOTE — Patient Instructions (Addendum)
[facility] - Cherre HugerKeith Wells or Lavonda JumboNancy Fleming   If no help from this, then let me know and we will get you into PT.  Massage or Accupuncture can also work well for pain.

## 2017-10-31 NOTE — Progress Notes (Signed)
Dr. Karleen HampshireSpencer T. Taquita Demby, MD, CAQ Sports Medicine Primary Care and Sports Medicine [address] [place], [address] Phone: [phone] Fax: [phone]  10/31/2017  Patient: Tamara LintChantel E Shaffer, MRN: [id number], DOB: 11/11/1975, 42 y.o.  Primary Physician:  Eustaquio BoydenGutierrez, Javier, MD   Chief Complaint  Patient presents with  . Follow-up    Back Pain-Seen in May   Subjective:   Tamara Shaffer is a 42 y.o. very pleasant female patient who presents with the following:  Has been doing some exercises and has done some yoga. Walking with minimal pain - still with some pain - had [place] stop on the beach last week. Body mass index is 43.18 kg/m.  The patient presents with ongoing mechanical back pain, bilaterally.  She is not having any numbness or radiculopathy.  She is having pain in her lower back as well as pain in her buttocks.  She has been doing some home rehab as well as some yoga.  She is not having any bowel or bladder incontinence or other worrisome features.  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   SI on the L  Chiropractor PT  Past Medical History, Surgical History, Social History, Family History, Problem List, Medications, and Allergies have been reviewed and updated if relevant.  Patient Active Problem List   Diagnosis Date Noted  . Hot flashes 07/04/2017  . OSA (obstructive sleep apnea) 04/07/2016  . Menorrhagia 01/28/2016  . Right shoulder pain 05/19/2015  . Anxiety 02/24/2014  . Migraine without aura 12/10/2012  . Elevated BP 12/10/2012  . Severe obesity (BMI >= 40) (HCC) 08/06/2012  . Sacroiliitis (HCC) 10/18/2011  . Anemia 09/02/2010  . Fatigue 07/12/2010  . ALLERGIC RHINITIS CAUSE UNSPECIFIED 05/27/2010  . AMENORRHEA 05/19/2010  . Hypothyroidism 04/14/2010  . STRESS INCONTINENCE 04/14/2010  . GERD (gastroesophageal reflux disease) 10/16/2007  . DEPRESSION 06/05/2007     Past Medical History:  Diagnosis Date  . Allergic rhinitis    seasonal  . GDM (gestational diabetes mellitus)   . GERD (gastroesophageal reflux disease)   . Hypothyroidism    medically treated, likely initially thyroiditis (heterogeneous gland on US 05/2015)  . Insomnia   . 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 Obesity   . Prediabetes    A1c 5.8% 11/2010  . Urinary incontinence   . Vulvar burning     Past Surgical History:  Procedure Laterality Date  . BREAST SURGERY  06/1994   Reconstruction  . CHOLECYSTECTOMY    . ECTOPIC PREGNANCY SURGERY  10/2008   Tube remained  . REDUCTION MAMMAPLASTY Left 1996  . thyroid us  06/2010   heterogeneous normal sized thyroid gland consistent with chronic thyroiditis    Social History   Socioeconomic History  . Marital status: Married    Spouse name: Not on file  . Number of children: 2  . Years of education: Not on file  . Highest education level: Not on file  Occupational History  . Occupation: Surveyor, miningTeaches school, 2nd grade    Employer: [employer]  Social Needs  . Financial resource strain: Not on file  . Food insecurity:    Worry: Not on file    Inability: Not on file  . Transportation needs:    Medical: Not on file    Non-medical: Not on file  Tobacco Use  . Smoking status: Never  Smoker  . Smokeless tobacco: Never Used  Substance and Sexual Activity  . Alcohol use: Yes    Comment: Socially  . Drug use: No  . Sexual activity: Yes    Partners: Male    Birth control/protection: Condom  Lifestyle  . Physical activity:    Days per week: Not on file    Minutes per session: Not on file  . Stress: Not on file  Relationships  . Social connections:    Talks on phone: Not on file    Gets together: Not on file    Attends religious service: Not on file    Active member of club or organization: Not on file    Attends meetings of clubs or organizations: Not on file     Relationship status: Not on file  . Intimate partner violence:    Fear of current or ex partner: Not on file    Emotionally abused: Not on file    Physically abused: Not on file    Forced sexual activity: Not on file  Other Topics Concern  . Not on file  Social History Narrative   Caffeine:  1 cup daily    Family History  Problem Relation Age of Onset  . Cancer Mother        leiomyosarcoma of abdomen  . Hypertension Father   . Heart disease Father        Porcine Valve Replacement  . Cancer Maternal Grandmother        Ovarian cancer  . Cancer Cousin        Thyroid cancer  . Diabetes Neg Hx   . Stroke Neg Hx     Allergies  Allergen Reactions  . Erythromycin     REACTION: GI upset, rash, SOB  . Penicillins     REACTION: as child, ok with amoxicillin in past    Medication list reviewed and updated in full in [facility].  GEN: No fevers, chills. Nontoxic. Primarily MSK c/o today. MSK: Detailed in the HPI GI: tolerating PO intake without difficulty Neuro: No numbness, parasthesias, or tingling associated. Otherwise the pertinent positives of the ROS are noted above.   Objective:   BP 138/80   Pulse 86   Temp 98.6 F (37 C) (Oral)   Ht 5\' 3"  (1.6 m)   Wt 243 lb 12 oz (110.6 kg)   BMI 43.18 kg/m    GEN: Well-developed,well-nourished,in no acute distress; alert,appropriate and cooperative throughout examination HEENT: Normocephalic and atraumatic without obvious abnormalities. Ears, externally no deformities PULM: Breathing comfortably in no respiratory distress EXT: No clubbing, cyanosis, or edema PSYCH: Normally interactive. Cooperative during the interview. Pleasant. Friendly and conversant. Not anxious or depressed appearing. Normal, full affect.  Range of motion at  the waist: Flexion: normal Extension: normal Lateral bending: normal Rotation: all normal  No echymosis or edema Rises to examination table with no difficulty Gait: non  antalgic  Inspection/Deformity: N Paraspinus Tenderness: l4-s1 b  B Ankle Dorsiflexion (L5,4): 5/5 B Great Toe Dorsiflexion (L5,4): 5/5 Heel Walk (L5): WNL Toe Walk (S1): WNL Rise/Squat (L4): WNL  SENSORY B Medial Foot (L4): WNL B Dorsum (L5): WNL B Lateral (S1): WNL Light Touch: WNL Pinprick: WNL  REFLEXES Knee (L4): 2+ Ankle (S1): 2+  B SLR, seated: neg B SLR, supine: neg B FABER: neg B Reverse FABER: neg B Greater Troch: NT B Log Roll: neg B Stork: NT B Sciatic Notch: TTP more on the L  Radiology: Dg Lumbar Spine Complete  Result Date: 10/31/2017 CLINICAL DATA:  Chronic low back pain. Left sacroiliac joint dysfunction. EXAM: LUMBAR SPINE - COMPLETE 4+ VIEW COMPARISON:  None. FINDINGS: There is no evidence of lumbar spine fracture. Alignment is normal. Intervertebral disc spaces are maintained. No facet arthritis. Sacroiliac joints appear normal. IMPRESSION: Normal exam. Electronically Signed   By: Francene Boyers M.D.   On: 10/31/2017 15:44     Assessment and Plan:   Sacroiliac joint dysfunction of left side - Plan: Ambulatory referral to Chiropractic, DG Lumbar Spine Complete  Chronic bilateral low back pain without sciatica - Plan: Ambulatory referral to Chiropractic, DG Lumbar Spine Complete  Morbid obesity with BMI of 40.0-44.9, adult (HCC)  >25 minutes spent in face to face time with patient, >50% spent in counselling or coordination of care   Ongoing chronic mechanical back pain.  BMI 43.  She has been doing a good job thus far of doing various conservative measures to try to intervene.  She is having a lot of pain at her SI joints right now, so chiropractic manipulation often helps this quite a bit.  She is going to try this first.  If symptoms persist, I asked her to call me, and physical therapy is also highly effective modality for this.  Massage as well as acupuncture would also be reasonable things to try.  I stressed weight loss is a important part  of her overall treatment strategy.  Follow-up: she will call me following above plan  Orders Placed This Encounter  Procedures  . DG Lumbar Spine Complete  . Ambulatory referral to Chiropractic    Signed,  Karleen Hampshire T. Terral Cooks, MD   Allergies as of 10/31/2017      Reactions   Erythromycin    REACTION: GI upset, rash, SOB   Penicillins    REACTION: as child, ok with amoxicillin in past      Medication List        Accurate as of 10/31/17 11:59 PM. Always use your most recent med list.          cyclobenzaprine 10 MG tablet Commonly known as:  FLEXERIL Take 1 tablet (10 mg total) by mouth 3 (three) times daily as needed for muscle spasms.   hydrochlorothiazide 25 MG tablet Commonly known as:  HYDRODIURIL Take 1 tablet (25 mg total) by mouth daily.   loratadine 10 MG tablet Commonly known as:  CLARITIN Take 10 mg by mouth daily.   multivitamin tablet Take 1 tablet by mouth daily.   omeprazole 20 MG capsule Commonly known as:  PRILOSEC Take 1 capsule (20 mg total) by mouth daily.   promethazine 25 MG tablet Commonly known as:  PHENERGAN Take 1 tablet (25 mg total) by mouth every 6 (six) hours as needed for nausea or vomiting.   SUMAtriptan 100 MG tablet Commonly known as:  IMITREX Take 1 tablet (100 mg total) by mouth once as needed for migraine.   SYNTHROID 125 MCG tablet Generic drug:  levothyroxine TAKE 1 TABLET BY MOUTH EVERY DAY BEFORE BREAKFAST   venlafaxine XR 75 MG 24 hr capsule Commonly known as:  EFFEXOR-XR Take 1 capsule (75 mg total) by mouth 3 (three) times daily.   Vitamin D 2000 units Caps Take 1 capsule (2,000 Units total) by mouth daily.

## 2017-11-04 ENCOUNTER — Encounter: Payer: Self-pay | Admitting: Family Medicine

## 2017-12-14 ENCOUNTER — Ambulatory Visit (INDEPENDENT_AMBULATORY_CARE_PROVIDER_SITE_OTHER): Payer: BC Managed Care – PPO | Admitting: Physician Assistant

## 2017-12-14 ENCOUNTER — Other Ambulatory Visit: Payer: Self-pay | Admitting: Obstetrics & Gynecology

## 2017-12-14 ENCOUNTER — Encounter: Payer: Self-pay | Admitting: Physician Assistant

## 2017-12-14 VITALS — BP 121/81 | HR 73 | Wt 245.0 lb

## 2017-12-14 DIAGNOSIS — F329 Major depressive disorder, single episode, unspecified: Secondary | ICD-10-CM | POA: Diagnosis not present

## 2017-12-14 DIAGNOSIS — G43009 Migraine without aura, not intractable, without status migrainosus: Secondary | ICD-10-CM | POA: Diagnosis not present

## 2017-12-14 DIAGNOSIS — F32A Depression, unspecified: Secondary | ICD-10-CM

## 2017-12-14 MED ORDER — VENLAFAXINE HCL ER 75 MG PO CP24: 75.0000 mg | ORAL_CAPSULE | Freq: Three times a day (TID) | ORAL | 0 refills | Status: DC

## 2017-12-14 MED ORDER — OMEPRAZOLE 20 MG PO CPDR: 20.0000 mg | DELAYED_RELEASE_CAPSULE | Freq: Every day | ORAL | 3 refills | Status: DC

## 2017-12-14 MED ORDER — TOPIRAMATE 25 MG PO TABS: 75.0000 mg | ORAL_TABLET | Freq: Two times a day (BID) | ORAL | 1 refills | Status: DC

## 2017-12-14 MED ORDER — CYCLOBENZAPRINE HCL 10 MG PO TABS: 10.0000 mg | ORAL_TABLET | Freq: Three times a day (TID) | ORAL | 1 refills | Status: DC | PRN

## 2017-12-14 NOTE — Progress Notes (Signed)
History:  Tamara Shaffer is a 42 y.o. G2P2 who presents to clinic today for headaches.  She has been taking Effexor for HA prevention also noting that it helps with depression and anxiety.  She feels like it has been less helpful around the beginning of the school year but is slightly better now.  Her severe headaches are occurring approx 1 time per month with a few min-mod ones sprinkled in as well.  Their quality is unchanged.    Flexeril helped and now not needed for jaw pain contributing to HA.  She has been waking with swelling in her hands and all over. Her mom's cancer has returned.   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   HIT6:65 Number of days in the last 4 weeks with:  Severe headache: 1 Moderate headache: 2 Mild headache: 2  No headache: 22   Past Medical History:  Diagnosis Date  . Allergic rhinitis    seasonal  . GDM (gestational diabetes mellitus)   . GERD (gastroesophageal reflux disease)   . Hypothyroidism    medically treated, likely initially thyroiditis (heterogeneous gland on [place] 05/2015)  . Insomnia   . 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 Obesity   . Prediabetes    A1c 5.8% 11/2010  . Urinary incontinence   . Vulvar burning     Social History   Socioeconomic History  . Marital status: Married    Spouse name: Not on file  . Number of children: 2  . Years of education: Not on file  . Highest education level: Not on file  Occupational History  . 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 Financial resource strain: Not on file  . Food insecurity:    Worry: Not on file    Inability: Not on file  . Transportation needs:    Medical: Not on file    Non-medical: Not on file  Tobacco Use  . Smoking status: Never Smoker  . Smokeless tobacco: Never Used  Substance and Sexual Activity  . Alcohol use: Yes    Comment: Socially  . Drug use: No  . Sexual activity:  Yes    Partners: Male    Birth control/protection: Condom  Lifestyle  . Physical activity:    Days per week: Not on file    Minutes per session: Not on file  . Stress: Not on file  Relationships  . Social connections:    Talks on phone: Not on file    Gets together: Not on file    Attends religious service: Not on file    Active member of club or organization: Not on file    Attends meetings of clubs or organizations: Not on file    Relationship status: Not on file  . Intimate partner violence:    Fear of current or ex partner: Not on file    Emotionally abused: Not on file    Physically abused: Not on file    Forced sexual activity: Not on file  Other Topics Concern  . Not on file  Social History Narrative   Caffeine:  1 cup daily    Family History  Problem Relation Age of Onset  . Cancer Mother        leiomyosarcoma of abdomen  . Hypertension Father   . Heart disease Father        Porcine Valve Replacement  . Cancer  Maternal Grandmother        Ovarian cancer  . Cancer Cousin        Thyroid cancer  . Diabetes Neg Hx   . Stroke Neg Hx     Allergies  Allergen Reactions  . Erythromycin     REACTION: GI upset, rash, SOB  . Penicillins     REACTION: as child, ok with amoxicillin in past    Current Outpatient Medications on File Prior to Visit  Medication Sig Dispense Refill  . Cholecalciferol (VITAMIN D) 2000 units CAPS Take 1 capsule (2,000 Units total) by mouth daily. 30 capsule   . cyclobenzaprine (FLEXERIL) 10 MG tablet Take 1 tablet (10 mg total) by mouth 3 (three) times daily as needed for muscle spasms. 180 tablet 1  . hydrochlorothiazide (HYDRODIURIL) 25 MG tablet Take 1 tablet (25 mg total) by mouth daily. 90 tablet 1  . loratadine (CLARITIN) 10 MG tablet Take 10 mg by mouth daily.    . Multiple Vitamin (MULTIVITAMIN) tablet Take 1 tablet by mouth daily.      Marland Kitchen omeprazole (PRILOSEC) 20 MG capsule Take 1 capsule (20 mg total) by mouth daily. 90 capsule 3   . promethazine (PHENERGAN) 25 MG tablet Take 1 tablet (25 mg total) by mouth every 6 (six) hours as needed for nausea or vomiting. 30 tablet 1  . SUMAtriptan (IMITREX) 100 MG tablet Take 1 tablet (100 mg total) by mouth once as needed for migraine. 27 tablet 4  . SYNTHROID 125 MCG tablet TAKE 1 TABLET BY MOUTH EVERY DAY BEFORE BREAKFAST 90 tablet 3  . venlafaxine XR (EFFEXOR-XR) 75 MG 24 hr capsule Take 1 capsule (75 mg total) by mouth 3 (three) times daily. 270 capsule 0   Current Facility-Administered Medications on File Prior to Visit  Medication Dose Route Frequency Provider Last Rate Last Dose  . diclofenac (VOLTAREN) EC tablet 75 mg  75 mg Oral BID Reva Bores, MD         Review of Systems:  All pertinent positive/negative included in HPI, all other review of systems are negative   Objective:  Physical Exam BP 121/81   Pulse 73   Wt 245 lb (111.1 kg)   BMI 43.40 kg/m  CONSTITUTIONAL: Well-developed, well-nourished female in no acute distress.  EYES: EOM intact ENT: Normocephalic CARDIOVASCULAR: Regular rate  RESPIRATORY: Normal rate.  MUSCULOSKELETAL: Normal ROM SKIN: Warm, dry without erythema  NEUROLOGICAL: Alert, oriented, CN II-XII grossly intact, Appropriate balancevior, mood   Assessment & Plan:  Assessment: 1. Migraine without aura and without status migrainosus, not intractable   2. Depression, unspecified depression type   3. Severe obesity (BMI >= 40) (HCC)      Plan: Continue Effexor at current dose for depression/migraine prevention and maybe even help as she encounters perimenopause Begin Topamax for migraine prevention and weight loss.  Begin 25mg  and titrate up weekly as tolerated to max dose of 75mg .  Diet should focus on vegetables, lean protein and fruit.  Limit carbs.  Eat frequent, small meals.  Exercise 5 days per week.  She agrees she will exercise EVERY DAY that her kids are at [employer].   Follow-up in 1-2 months or sooner PRN   Will see Tamara Shaffer more frequently to help accountability  Bertram Denver, PA-C 12/14/2017 8:19 AM

## 2017-12-14 NOTE — Patient Instructions (Signed)
Exercising to Lose Weight Exercising can help you to lose weight. In order to lose weight through exercise, you need to do vigorous-intensity exercise. You can tell that you are exercising with vigorous intensity if you are breathing very hard and fast and cannot hold a conversation while exercising. Moderate-intensity exercise helps to maintain your current weight. You can tell that you are exercising at a moderate level if you have a higher heart rate and faster breathing, but you are still able to hold a conversation. How often should I exercise? Choose an activity that you enjoy and set realistic goals. Your health care provider can help you to make an activity plan that works for you. Exercise regularly as directed by your health care provider. This may include:  Doing resistance training twice each week, such as: ? Push-ups. ? Sit-ups. ? Lifting weights. ? Using resistance bands.  Doing a given intensity of exercise for a given amount of time. Choose from these options: ? 150 minutes of moderate-intensity exercise every week. ? 75 minutes of vigorous-intensity exercise every week. ? A mix of moderate-intensity and vigorous-intensity exercise every week.  Children, pregnant women, people who are out of shape, people who are overweight, and older adults may need to consult a health care provider for individual recommendations. If you have any sort of medical condition, be sure to consult your health care provider before starting a new exercise program. What are some activities that can help me to lose weight?  Walking at a rate of at least 4.5 miles an hour.  Jogging or running at a rate of 5 miles per hour.  Biking at a rate of at least 10 miles per hour.  Lap swimming.  Roller-skating or in-line skating.  Cross-country skiing.  Vigorous competitive sports, such as football, basketball, and soccer.  Jumping rope.  Aerobic dancing. How can I be more active in my day-to-day  activities?  Use the stairs instead of the elevator.  Take a walk during your lunch break.  If you drive, park your car farther away from work or school.  If you take public transportation, get off one stop early and walk the rest of the way.  Make all of your phone calls while standing up and walking around.  Get up, stretch, and walk around every 30 minutes throughout the day. What guidelines should I follow while exercising?  Do not exercise so much that you hurt yourself, feel dizzy, or get very short of breath.  Consult your health care provider prior to starting a new exercise program.  Wear comfortable clothes and shoes with good support.  Drink plenty of water while you exercise to prevent dehydration or heat stroke. Body water is lost during exercise and must be replaced.  Work out until you breathe faster and your heart beats faster. This information is not intended to replace advice given to you by your health care provider. Make sure you discuss any questions you have with your health care provider. Document Released: 04/08/2010 Document Revised: 08/12/2015 Document Reviewed: 08/07/2013 Elsevier Interactive Patient Education  2018 Elsevier Inc.  

## 2017-12-17 ENCOUNTER — Encounter: Payer: Self-pay | Admitting: Radiology

## 2018-01-25 ENCOUNTER — Telehealth: Payer: Self-pay | Admitting: Physician Assistant

## 2018-01-29 ENCOUNTER — Telehealth: Payer: Self-pay | Admitting: Radiology

## 2018-01-29 NOTE — Telephone Encounter (Signed)
Left voicemail to call [facility] to schedule headache f/u with Nada MaclachlanKaren Teague Clark in December

## 2018-02-18 ENCOUNTER — Ambulatory Visit: Payer: BC Managed Care – PPO | Admitting: Family Medicine

## 2018-02-27 ENCOUNTER — Other Ambulatory Visit: Payer: Self-pay | Admitting: Physician Assistant

## 2018-05-09 ENCOUNTER — Other Ambulatory Visit: Payer: Self-pay | Admitting: *Deleted

## 2018-05-09 MED ORDER — TOPIRAMATE 25 MG PO TABS: 25.0000 mg | ORAL_TABLET | Freq: Every day | ORAL | 1 refills | Status: DC

## 2018-06-13 ENCOUNTER — Other Ambulatory Visit: Payer: Self-pay | Admitting: Physician Assistant

## 2018-06-13 DIAGNOSIS — F32A Depression, unspecified: Secondary | ICD-10-CM

## 2018-06-13 DIAGNOSIS — F329 Major depressive disorder, single episode, unspecified: Secondary | ICD-10-CM

## 2018-06-14 ENCOUNTER — Telehealth: Payer: Self-pay

## 2018-06-14 DIAGNOSIS — F329 Major depressive disorder, single episode, unspecified: Secondary | ICD-10-CM

## 2018-06-14 DIAGNOSIS — F32A Depression, unspecified: Secondary | ICD-10-CM

## 2018-06-14 MED ORDER — VENLAFAXINE HCL ER 75 MG PO CP24: 75.0000 mg | ORAL_CAPSULE | Freq: Three times a day (TID) | ORAL | 0 refills | Status: DC

## 2018-06-14 NOTE — Telephone Encounter (Signed)
Pt left v/m at 5:01 pm that pt has been trying to get venlafaxine refill from Nada Maclachlan PA and has not gotten cb from that office. Pt has enough med to last until Mon morning; 06/17/18. Pt wants to know if Dr Reece Agar would refill med;Dr G has not prescribed before; pt last seen by Dr Reece Agar on 07/04/17. Dr Reece Agar said to prescribe # 45 and pt is to get in contact with Nada Maclachlan PA next wk. Pt voiced understanding and very appreciative. FYI to Dr Reece Agar.

## 2018-06-15 NOTE — Telephone Encounter (Signed)
Agree with this. Thank you for sending in.

## 2018-06-18 ENCOUNTER — Other Ambulatory Visit: Payer: Self-pay

## 2018-06-18 DIAGNOSIS — F329 Major depressive disorder, single episode, unspecified: Secondary | ICD-10-CM

## 2018-06-18 DIAGNOSIS — F32A Depression, unspecified: Secondary | ICD-10-CM

## 2018-06-18 MED ORDER — VENLAFAXINE HCL ER 75 MG PO CP24: 75.0000 mg | ORAL_CAPSULE | Freq: Three times a day (TID) | ORAL | 0 refills | Status: DC

## 2018-08-09 ENCOUNTER — Encounter: Payer: BC Managed Care – PPO | Admitting: Physician Assistant

## 2018-08-09 ENCOUNTER — Encounter: Payer: Self-pay | Admitting: Physician Assistant

## 2018-08-09 ENCOUNTER — Ambulatory Visit: Payer: BC Managed Care – PPO | Admitting: Physician Assistant

## 2018-08-09 ENCOUNTER — Other Ambulatory Visit: Payer: Self-pay

## 2018-08-09 DIAGNOSIS — F329 Major depressive disorder, single episode, unspecified: Secondary | ICD-10-CM | POA: Diagnosis not present

## 2018-08-09 DIAGNOSIS — G43009 Migraine without aura, not intractable, without status migrainosus: Secondary | ICD-10-CM

## 2018-08-09 DIAGNOSIS — G43911 Migraine, unspecified, intractable, with status migrainosus: Secondary | ICD-10-CM | POA: Diagnosis not present

## 2018-08-09 DIAGNOSIS — F32A Depression, unspecified: Secondary | ICD-10-CM

## 2018-08-09 MED ORDER — METOPROLOL TARTRATE 25 MG PO TABS: 25.0000 mg | ORAL_TABLET | Freq: Every morning | ORAL | 1 refills | Status: DC

## 2018-08-09 MED ORDER — SUMATRIPTAN SUCCINATE 100 MG PO TABS: 100.0000 mg | ORAL_TABLET | Freq: Once | ORAL | 4 refills | Status: DC | PRN

## 2018-08-09 MED ORDER — PROMETHAZINE HCL 25 MG PO TABS: 25.0000 mg | ORAL_TABLET | Freq: Four times a day (QID) | ORAL | 1 refills | Status: DC | PRN

## 2018-08-09 MED ORDER — TOPIRAMATE 100 MG PO TABS: 100.0000 mg | ORAL_TABLET | Freq: Two times a day (BID) | ORAL | 3 refills | Status: DC

## 2018-08-09 MED ORDER — VENLAFAXINE HCL ER 75 MG PO CP24: 75.0000 mg | ORAL_CAPSULE | Freq: Three times a day (TID) | ORAL | 3 refills | Status: DC

## 2018-08-09 NOTE — Progress Notes (Signed)
History:  Tamara Shaffer is a 43 y.o. G2P2 who presents to clinic today for followup on headache.  She has had explosive headaches.  She has had increased anxiety with this COVID system.  She did not return as directed after her 3 months on new medicine: Topamax.  She feels it may have helped some - especially with sleep - but now she is not aware that it is helping.    HIT6:58 Number of days in the last 4 weeks with:  Severe headache: 2 Moderate headache: 8 Mild headache: 8 No headache: 10  Past Medical History:  Diagnosis Date  . Allergic rhinitis    seasonal  . GDM (gestational diabetes mellitus)   . GERD (gastroesophageal reflux disease)   . Hypothyroidism    medically treated, likely initially thyroiditis (heterogeneous gland on [place] 05/2015)  . Insomnia   . 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 Obesity   . Prediabetes    A1c 5.8% 11/2010  . Urinary incontinence   . Vulvar burning     Social History   Socioeconomic History  . Marital status: Married    Spouse name: Not on file  . Number of children: 2  . Years of education: Not on file  . Highest education level: Not on file  Occupational History  . Occupation: Surveyor, miningTeaches school, 2nd grade    Employer: [employer]  Social Needs  . Financial resource strain: Not on file  . Food insecurity:    Worry: Not on file    Inability: Not on file  . Transportation needs:    Medical: Not on file    Non-medical: Not on file  Tobacco Use  . Smoking status: Never Smoker  . Smokeless tobacco: Never Used  Substance and Sexual Activity  . Alcohol use: Yes    Comment: Socially  . Drug use: No  . Sexual activity: Yes    Partners: Male    Birth control/protection: Condom  Lifestyle  . Physical activity:    Days per week: Not on file    Minutes per session: Not on file  . Stress: Not on file  Relationships  . Social connections:    Talks on phone: Not on file     Gets together: Not on file    Attends religious service: Not on file    Active member of club or organization: Not on file    Attends meetings of clubs or organizations: Not on file    Relationship status: Not on file  . Intimate partner violence:    Fear of current or ex partner: Not on file    Emotionally abused: Not on file    Physically abused: Not on file    Forced sexual activity: Not on file  Other Topics Concern  . Not on file  Social History Narrative   Caffeine:  1 cup daily    Family History  Problem Relation Age of Onset  . Cancer Mother        leiomyosarcoma of abdomen  . Hypertension Father   . Heart disease Father        Porcine Valve Replacement  . Cancer Maternal Grandmother        Ovarian cancer  . Cancer Cousin        Thyroid cancer  . Diabetes Neg Hx   . Stroke Neg Hx     Allergies  Allergen Reactions  . Erythromycin  REACTION: GI upset, rash, SOB  . Penicillins     REACTION: as child, ok with amoxicillin in past    Current Outpatient Medications on File Prior to Visit  Medication Sig Dispense Refill  . Cholecalciferol (VITAMIN D) 2000 units CAPS Take 1 capsule (2,000 Units total) by mouth daily. 30 capsule   . cyclobenzaprine (FLEXERIL) 10 MG tablet Take 1 tablet (10 mg total) by mouth 3 (three) times daily as needed for muscle spasms. 180 tablet 1  . hydrochlorothiazide (HYDRODIURIL) 25 MG tablet Take 1 tablet (25 mg total) by mouth daily. 90 tablet 1  . loratadine (CLARITIN) 10 MG tablet Take 10 mg by mouth daily.    . Multiple Vitamin (MULTIVITAMIN) tablet Take 1 tablet by mouth daily.      Marland Kitchen omeprazole (PRILOSEC) 20 MG capsule Take 1 capsule (20 mg total) by mouth daily. 90 capsule 3  . promethazine (PHENERGAN) 25 MG tablet Take 1 tablet (25 mg total) by mouth every 6 (six) hours as needed for nausea or vomiting. 30 tablet 1  . SUMAtriptan (IMITREX) 100 MG tablet Take 1 tablet (100 mg total) by mouth once as needed for migraine. 27 tablet 4   . SYNTHROID 125 MCG tablet TAKE 1 TABLET BY MOUTH EVERY DAY BEFORE BREAKFAST 90 tablet 3  . topiramate (TOPAMAX) 25 MG tablet Take 1 tablet (25 mg total) by mouth daily. 30 tablet 1  . venlafaxine XR (EFFEXOR-XR) 75 MG 24 hr capsule Take 1 capsule (75 mg total) by mouth 3 (three) times daily. 45 capsule 0  . venlafaxine XR (EFFEXOR-XR) 75 MG 24 hr capsule Take 1 capsule (75 mg total) by mouth 3 (three) times daily. 270 capsule 0   Current Facility-Administered Medications on File Prior to Visit  Medication Dose Route Frequency Provider Last Rate Last Dose  . diclofenac (VOLTAREN) EC tablet 75 mg  75 mg Oral BID Reva Bores, MD         Review of Systems:  All pertinent positive/negative included in HPI, all other review of systems are negative   Objective:  Physical Exam BP 120/80   Pulse 70  CONSTITUTIONAL: Well-developed, well-nourished female in no acute distress.  EYES: EOM intact ENT: Normocephalic CARDIOVASCULAR: Regular rate  RESPIRATORY: Normal rate.  MUSCULOSKELETAL: Normal ROM SKIN: Warm, dry without erythema  NEUROLOGICAL: Alert, oriented, CN II-XII grossly intact, Appropriate balance  PSYCH: Normal behavior, mood   Assessment & Plan:  Assessment: 1. Migraine without aura and without status migrainosus, not intractable   2. Intractable migraine with status migrainosus, unspecified migraine type   3. Depression, unspecified depression type   4. Severe obesity (BMI >= 40) (HCC) Chronic   Not improved, worsened HA.  Plan: Increase daily Topamax to 100mg  daily Begin Metoprolol 25mg  daily to help with anxiety and for migraine prevention Sumatriptan - dosing clarified that she may take 1 at onset of migraine and repeat dose after 2 hours if needed.  Limit of 2/day, 2 days/week. Continue Venlafaxine Promethazine for rescue/nausea  Follow-up before school resumes (Early August) sooner PRN  Bertram Denver, PA-C 08/09/2018 9:05 AM

## 2018-08-09 NOTE — Patient Instructions (Signed)

## 2018-08-20 ENCOUNTER — Telehealth: Payer: Self-pay | Admitting: Radiology

## 2018-08-20 NOTE — Telephone Encounter (Signed)
Called patient to schedule f/u headache appointment with Nada Maclachlan for July 2020. Patient states that she will call back closer to July to schedule appointment.

## 2018-09-09 ENCOUNTER — Telehealth: Payer: Self-pay | Admitting: Family Medicine

## 2018-09-11 NOTE — Telephone Encounter (Signed)
E-scribed refill.  Pls schedule CPE or f/u and labs.

## 2018-09-11 NOTE — Telephone Encounter (Signed)
Patient left a voicemail stating that she needs a refill on her Synthroid. Patient stated that her pharmacy has requested it for her and has not heard anything back on the refill.

## 2018-09-18 ENCOUNTER — Telehealth: Payer: Self-pay | Admitting: *Deleted

## 2018-09-18 NOTE — Telephone Encounter (Signed)
Pt called with concerns with having frequent BM's and some rectal bleeding. Pt denies any previous issues and denies any pain. Pt wanted to see if the new medication Gustavo Lah started her on, was this a possible side effect or should she follow up with PCP. Informed pt I would reach out to Santiago Glad to ask. Per Santiago Glad she was not aware of these issues with the medication metoprolol and pt should follow up with PCP. Pt has appointment with Dr Hulan Fray for an annual and will go from there.

## 2018-09-23 NOTE — Telephone Encounter (Signed)
Noted  

## 2018-09-23 NOTE — Telephone Encounter (Signed)
Pt states she gets a cpe from her obgyn so she scheduled a myChart visit with Dr. Darnell Level on 09/27/18 with labs after.

## 2018-09-26 ENCOUNTER — Other Ambulatory Visit: Payer: Self-pay | Admitting: Family Medicine

## 2018-09-26 DIAGNOSIS — D649 Anemia, unspecified: Secondary | ICD-10-CM

## 2018-09-26 DIAGNOSIS — E039 Hypothyroidism, unspecified: Secondary | ICD-10-CM

## 2018-09-26 DIAGNOSIS — E559 Vitamin D deficiency, unspecified: Secondary | ICD-10-CM

## 2018-09-26 DIAGNOSIS — Z13220 Encounter for screening for lipoid disorders: Secondary | ICD-10-CM

## 2018-09-27 ENCOUNTER — Other Ambulatory Visit (INDEPENDENT_AMBULATORY_CARE_PROVIDER_SITE_OTHER): Payer: BC Managed Care – PPO

## 2018-09-27 ENCOUNTER — Telehealth (INDEPENDENT_AMBULATORY_CARE_PROVIDER_SITE_OTHER): Payer: BC Managed Care – PPO | Admitting: Family Medicine

## 2018-09-27 ENCOUNTER — Encounter: Payer: Self-pay | Admitting: Family Medicine

## 2018-09-27 VITALS — BP 125/80 | HR 63 | Temp 98.7°F | Ht 63.0 in | Wt 246.0 lb

## 2018-09-27 DIAGNOSIS — Z13220 Encounter for screening for lipoid disorders: Secondary | ICD-10-CM

## 2018-09-27 DIAGNOSIS — G43009 Migraine without aura, not intractable, without status migrainosus: Secondary | ICD-10-CM | POA: Diagnosis not present

## 2018-09-27 DIAGNOSIS — E039 Hypothyroidism, unspecified: Secondary | ICD-10-CM | POA: Diagnosis not present

## 2018-09-27 DIAGNOSIS — D649 Anemia, unspecified: Secondary | ICD-10-CM

## 2018-09-27 DIAGNOSIS — F331 Major depressive disorder, recurrent, moderate: Secondary | ICD-10-CM

## 2018-09-27 DIAGNOSIS — E559 Vitamin D deficiency, unspecified: Secondary | ICD-10-CM

## 2018-09-27 MED ORDER — HYDROCHLOROTHIAZIDE 25 MG PO TABS: 25.0000 mg | ORAL_TABLET | Freq: Every day | ORAL | 3 refills | Status: DC | PRN

## 2018-09-27 NOTE — Assessment & Plan Note (Signed)
Followed by headache provider.

## 2018-09-27 NOTE — Assessment & Plan Note (Signed)
Compliant with brand Synthroid. To come in this afternoon for labs.

## 2018-09-27 NOTE — Assessment & Plan Note (Signed)
Encouraged ongoing efforts at healthy diet and regular exercise routine for sustainable weight loss. She is on topamax for headaches which can suppress appetite.

## 2018-09-27 NOTE — Progress Notes (Signed)
Virtual visit completed through [place]. Due to national recommendations of social distancing due to COVID-19, a virtual visit is felt to be most appropriate for this patient at this time. Reviewed limitations of a virtual visit.   Patient location: home Provider location:  at [facility], office If any vitals were documented, they were collected by patient at home unless specified below.    BP 125/80   Pulse 63   Temp 98.7 F (37.1 C) (Tympanic)   Ht 5\' 3"  (1.6 m)   Wt 246 lb (111.6 kg)   LMP 09/20/2018   BMI 43.58 kg/m     CC: med refill visit Subjective:    Patient ID: Tamara Shaffer, female    DOB: 1975/05/16, 43 y.o.   MRN: [id number]  HPI: Tamara Shaffer is a 43 y.o. female presenting on 09/27/2018 for Follow-up (HCTZ was stopped due to not able to get refilled. Wants to discuss if needed. )   Hypothyroidism - complaint with levothyroxine 173mcg daily. Separates this from other meds, takes on empty stomach.  Obesity - ongoing struggle. Trying to walk daily, eat healthy, 10 lb weight loss over summer. Wonders if effexor contributing. Has not been taking hctz - had run out.   HA - metoprolol started for this, topamax 100mg  at night. Also on sumatriptan PRN. This is followed by HA provider.  Anxiety/depression - continues effexor 75mg  TID, stable period over summer but did have significant stressors at end of school year last year.  Mom going through chemo for stage 4 breast cancer. She has been unable to visit her due to [facility]).      Relevant past medical, surgical, family and social history reviewed and updated as indicated. Interim medical history since our last visit reviewed. Allergies and medications reviewed and updated. Outpatient Medications Prior to Visit  Medication Sig Dispense Refill  . Cholecalciferol (VITAMIN D) 2000 units CAPS Take 1 capsule (2,000 Units total) by mouth daily. 30 capsule   . cyanocobalamin 1000 MCG tablet Take 1,000 mcg by  mouth daily.    . cyclobenzaprine (FLEXERIL) 10 MG tablet Take 1 tablet (10 mg total) by mouth 3 (three) times daily as needed for muscle spasms. 180 tablet 1  . levonorgestrel (MIRENA) 20 MCG/24HR IUD 1 each by Intrauterine route once.    . loratadine (CLARITIN) 10 MG tablet Take 10 mg by mouth daily.    . metoprolol tartrate (LOPRESSOR) 25 MG tablet Take 1 tablet (25 mg total) by mouth every morning. 90 tablet 1  . Multiple Vitamin (MULTIVITAMIN) tablet Take 1 tablet by mouth daily.      Marland Kitchen omeprazole (PRILOSEC) 20 MG capsule Take 1 capsule (20 mg total) by mouth daily. 90 capsule 3  . promethazine (PHENERGAN) 25 MG tablet Take 1 tablet (25 mg total) by mouth every 6 (six) hours as needed for nausea or vomiting. 30 tablet 1  . SUMAtriptan (IMITREX) 100 MG tablet Take 1 tablet (100 mg total) by mouth once as needed for migraine. 27 tablet 4  . SYNTHROID 125 MCG tablet TAKE 1 TABLET BY MOUTH EVERY DAY BEFORE BREAKFAST 90 tablet 0  . topiramate (TOPAMAX) 100 MG tablet Take 1 tablet (100 mg total) by mouth 2 (two) times daily. (Patient taking differently: Take 100 mg by mouth at bedtime. ) 90 tablet 3  . venlafaxine XR (EFFEXOR-XR) 75 MG 24 hr capsule Take 1 capsule (75 mg total) by mouth 3 (three) times daily. (Patient taking differently: Take 75 mg by mouth  daily. ) 270 capsule 3  . hydrochlorothiazide (HYDRODIURIL) 25 MG tablet Take 1 tablet (25 mg total) by mouth daily. (Patient not taking: Reported on 09/27/2018) 90 tablet 1   Facility-Administered Medications Prior to Visit  Medication Dose Route Frequency Provider Last Rate Last Dose  . diclofenac (VOLTAREN) EC tablet 75 mg  75 mg Oral BID Reva BoresPratt, Tanya S, MD         Per HPI unless specifically indicated in ROS section below Review of Systems Objective:    BP 125/80   Pulse 63   Temp 98.7 F (37.1 C) (Tympanic)   Ht 5\' 3"  (1.6 m)   Wt 246 lb (111.6 kg)   LMP 09/20/2018   BMI 43.58 kg/m   Wt Readings from Last 3 Encounters:   09/27/18 246 lb (111.6 kg)  12/14/17 245 lb (111.1 kg)  10/31/17 243 lb 12 oz (110.6 kg)     Physical exam: Gen: alert, NAD, not ill appearing Pulm: speaks in complete sentences without increased work of breathing Psych: normal mood, normal thought content      [facility] 09/27/2018  Decreased Interest 1  Down, Depressed, Hopeless 1  PHQ - 2 Score 2  Altered sleeping 1  Tired, decreased energy 1  Change in appetite 0  Feeling bad or failure about yourself  0  Trouble concentrating 0  Moving slowly or fidgety/restless 0  Suicidal thoughts 0  PHQ-9 Score 4    GAD 7 : Generalized Anxiety Score 09/27/2018  Nervous, Anxious, on Edge 1  Control/stop worrying 0  Worry too much - different things 3  Trouble relaxing 0  Restless 0  Easily annoyed or irritable 1  Afraid - awful might happen 0  Total GAD 7 Score 5   Assessment & Plan:   Problem List Items Addressed This Visit    Severe obesity (BMI >= 40) (HCC)    Encouraged ongoing efforts at healthy diet and regular exercise routine for sustainable weight loss. She is on topamax for headaches which can suppress appetite.      Migraine without aura    Followed by headache provider.       Relevant Medications   hydrochlorothiazide (HYDRODIURIL) 25 MG tablet   MDD (major depressive disorder), recurrent episode, moderate (HCC)   Hypothyroidism - Primary    Compliant with brand Synthroid. To come in this afternoon for labs.           Meds ordered this encounter  Medications  . hydrochlorothiazide (HYDRODIURIL) 25 MG tablet    Sig: Take 1 tablet (25 mg total) by mouth daily as needed (leg swelling).    Dispense:  30 tablet    Refill:  3   No orders of the defined types were placed in this encounter.   I discussed the assessment and treatment plan with the patient. The patient was provided an opportunity to ask questions and all were answered. The patient agreed with the plan and demonstrated an  understanding of the instructions. The patient was advised to call back or seek an in-person evaluation if the symptoms worsen or if the condition fails to improve as anticipated.  Follow up plan: No follow-ups on file.  Eustaquio BoydenJavier Dashae Wilcher, MD

## 2018-09-27 NOTE — Addendum Note (Signed)
Addended by: Cloyd Stagers on: 09/27/2018 01:30 PM   Modules accepted: Orders

## 2018-09-28 ENCOUNTER — Other Ambulatory Visit: Payer: Self-pay | Admitting: Family Medicine

## 2018-09-28 ENCOUNTER — Encounter: Payer: Self-pay | Admitting: Family Medicine

## 2018-09-28 DIAGNOSIS — D72829 Elevated white blood cell count, unspecified: Secondary | ICD-10-CM | POA: Insufficient documentation

## 2018-09-28 LAB — CBC WITH DIFFERENTIAL/PLATELET
Basophils Absolute: 0 10*3/uL (ref 0.0–0.2)
Basos: 0 %
EOS (ABSOLUTE): 0.4 10*3/uL (ref 0.0–0.4)
Eos: 3 %
Hematocrit: 40.3 % (ref 34.0–46.6)
Hemoglobin: 13.3 g/dL (ref 11.1–15.9)
Immature Grans (Abs): 0 10*3/uL (ref 0.0–0.1)
Immature Granulocytes: 0 %
Lymphocytes Absolute: 3.4 10*3/uL — ABNORMAL HIGH (ref 0.7–3.1)
Lymphs: 28 %
MCH: 26.7 pg (ref 26.6–33.0)
MCHC: 33 g/dL (ref 31.5–35.7)
MCV: 81 fL (ref 79–97)
Monocytes Absolute: 0.7 10*3/uL (ref 0.1–0.9)
Monocytes: 6 %
Neutrophils Absolute: 7.6 10*3/uL — ABNORMAL HIGH (ref 1.4–7.0)
Neutrophils: 63 %
Platelets: 305 10*3/uL (ref 150–450)
RBC: 4.99 x10E6/uL (ref 3.77–5.28)
RDW: 13.5 % (ref 11.7–15.4)
WBC: 12.2 10*3/uL — ABNORMAL HIGH (ref 3.4–10.8)

## 2018-09-28 LAB — BASIC METABOLIC PANEL
BUN/Creatinine Ratio: 13 (ref 9–23)
BUN: 12 mg/dL (ref 6–24)
CO2: 19 mmol/L — ABNORMAL LOW (ref 20–29)
Calcium: 9.5 mg/dL (ref 8.7–10.2)
Chloride: 105 mmol/L (ref 96–106)
Creatinine, Ser: 0.91 mg/dL (ref 0.57–1.00)
GFR calc Af Amer: 90 mL/min/{1.73_m2} (ref >59)
GFR calc non Af Amer: 78 mL/min/{1.73_m2} (ref >59)
Glucose: 91 mg/dL (ref 65–99)
Potassium: 4.2 mmol/L (ref 3.5–5.2)
Sodium: 139 mmol/L (ref 134–144)

## 2018-09-28 LAB — TSH: TSH: 1.14 u[IU]/mL (ref 0.450–4.500)

## 2018-09-28 LAB — LIPID PANEL
Chol/HDL Ratio: 4.5 ratio — ABNORMAL HIGH (ref 0.0–4.4)
Cholesterol, Total: 200 mg/dL — ABNORMAL HIGH (ref 100–199)
HDL: 44 mg/dL (ref >39)
LDL Calculated: 129 mg/dL — ABNORMAL HIGH (ref 0–99)
Triglycerides: 134 mg/dL (ref 0–149)
VLDL Cholesterol Cal: 27 mg/dL (ref 5–40)

## 2018-09-28 LAB — T4, FREE: Free T4: 1.13 ng/dL (ref 0.82–1.77)

## 2018-09-28 LAB — VITAMIN D 25 HYDROXY (VIT D DEFICIENCY, FRACTURES): Vit D, 25-Hydroxy: 32.6 ng/mL (ref 30.0–100.0)

## 2018-10-16 ENCOUNTER — Other Ambulatory Visit: Payer: Self-pay

## 2018-10-16 ENCOUNTER — Other Ambulatory Visit (INDEPENDENT_AMBULATORY_CARE_PROVIDER_SITE_OTHER): Payer: BC Managed Care – PPO

## 2018-10-16 DIAGNOSIS — D72829 Elevated white blood cell count, unspecified: Secondary | ICD-10-CM

## 2018-10-16 NOTE — Addendum Note (Signed)
Addended by: Cloyd Stagers on: 10/16/2018 08:05 AM   Modules accepted: Orders

## 2018-10-17 LAB — CBC WITH DIFFERENTIAL/PLATELET
Basophils Absolute: 0 10*3/uL (ref 0.0–0.2)
Basos: 0 %
EOS (ABSOLUTE): 0.3 10*3/uL (ref 0.0–0.4)
Eos: 4 %
Hematocrit: 41.4 % (ref 34.0–46.6)
Hemoglobin: 13.8 g/dL (ref 11.1–15.9)
Immature Grans (Abs): 0 10*3/uL (ref 0.0–0.1)
Immature Granulocytes: 0 %
Lymphocytes Absolute: 2.6 10*3/uL (ref 0.7–3.1)
Lymphs: 28 %
MCH: 26.8 pg (ref 26.6–33.0)
MCHC: 33.3 g/dL (ref 31.5–35.7)
MCV: 80 fL (ref 79–97)
Monocytes Absolute: 0.7 10*3/uL (ref 0.1–0.9)
Monocytes: 7 %
Neutrophils Absolute: 5.5 10*3/uL (ref 1.4–7.0)
Neutrophils: 61 %
Platelets: 281 10*3/uL (ref 150–450)
RBC: 5.15 x10E6/uL (ref 3.77–5.28)
RDW: 13.5 % (ref 11.7–15.4)
WBC: 9.2 10*3/uL (ref 3.4–10.8)

## 2018-10-17 LAB — CORTISOL-AM, BLOOD: Cortisol - AM: 17.4 ug/dL (ref 6.2–19.4)

## 2018-10-21 LAB — PATHOLOGIST SMEAR REVIEW
Basophils Absolute: 0.1 10*3/uL (ref 0.0–0.2)
Basos: 1 %
EOS (ABSOLUTE): 0.3 10*3/uL (ref 0.0–0.4)
Eos: 4 %
Hematocrit: 40.5 % (ref 34.0–46.6)
Hemoglobin: 13.8 g/dL (ref 11.1–15.9)
Immature Grans (Abs): 0.1 10*3/uL (ref 0.0–0.1)
Immature Granulocytes: 1 %
Lymphocytes Absolute: 2.4 10*3/uL (ref 0.7–3.1)
Lymphs: 27 %
MCH: 26.5 pg — ABNORMAL LOW (ref 26.6–33.0)
MCHC: 34.1 g/dL (ref 31.5–35.7)
MCV: 78 fL — ABNORMAL LOW (ref 79–97)
Monocytes Absolute: 0.6 10*3/uL (ref 0.1–0.9)
Monocytes: 7 %
Neutrophils Absolute: 5.6 10*3/uL (ref 1.4–7.0)
Neutrophils: 60 %
Path Rev PLTs: NORMAL
Path Rev WBC: NORMAL
Platelets: 282 10*3/uL (ref 150–450)
RBC: 5.2 x10E6/uL (ref 3.77–5.28)
RDW: 13 % (ref 11.7–15.4)
WBC: 9 10*3/uL (ref 3.4–10.8)

## 2018-10-22 NOTE — Telephone Encounter (Signed)
error 

## 2018-10-24 ENCOUNTER — Ambulatory Visit (INDEPENDENT_AMBULATORY_CARE_PROVIDER_SITE_OTHER): Payer: BC Managed Care – PPO | Admitting: Obstetrics & Gynecology

## 2018-10-24 ENCOUNTER — Other Ambulatory Visit: Payer: Self-pay

## 2018-10-24 ENCOUNTER — Encounter: Payer: Self-pay | Admitting: Obstetrics & Gynecology

## 2018-10-24 VITALS — BP 123/83 | HR 76 | Ht 64.0 in | Wt 237.0 lb

## 2018-10-24 DIAGNOSIS — Z1151 Encounter for screening for human papillomavirus (HPV): Secondary | ICD-10-CM | POA: Diagnosis not present

## 2018-10-24 DIAGNOSIS — Z124 Encounter for screening for malignant neoplasm of cervix: Secondary | ICD-10-CM

## 2018-10-24 DIAGNOSIS — Z01419 Encounter for gynecological examination (general) (routine) without abnormal findings: Secondary | ICD-10-CM

## 2018-10-24 DIAGNOSIS — F32A Depression, unspecified: Secondary | ICD-10-CM

## 2018-10-24 DIAGNOSIS — N3946 Mixed incontinence: Secondary | ICD-10-CM

## 2018-10-24 DIAGNOSIS — F329 Major depressive disorder, single episode, unspecified: Secondary | ICD-10-CM

## 2018-10-24 NOTE — Progress Notes (Signed)
Subjective:    Tamara Shaffer is a married 43 y.o. P2 (1 and 24 yo kids)  female who presents for an annual exam. She has lots of stress, depression, and mixed urinary incontinence. She denies HI/SI. Her mom has stage 4 peritoneal cancer. The patient is sexually active. GYN screening history: last pap: was normal. The patient wears seatbelts: yes. The patient participates in regular exercise: yes. (walks 3 miles per day, lost 20 pounds) Has the patient ever been transfused or tattooed?: no. The patient reports that there is not domestic violence in her life.   Menstrual History: OB History    Gravida  2   Para  2   Term      Preterm      AB      Living  2     SAB      TAB      Ectopic      Multiple      Live Births  2           Menarche age: 44 No LMP recorded. (Menstrual status: IUD).    The following portions of the patient's history were reviewed and updated as appropriate: allergies, current medications, past family history, past medical history, past social history, past surgical history and problem list.  Review of Systems Pertinent items are noted in HPI.   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 Married for 15 years FH- + peritoneal cancer in her mom and ovarian cancer in maternal GM, no colon cancer Patient tested negative for BRCA Mirena in for 3 years, light periods   Objective:    BP 123/83   Pulse 76   Ht 5' 4" (1.626 m)   Wt 237 lb (107.5 kg)   BMI 40.68 kg/m   General Appearance:    Alert, cooperative, no distress, appears stated age  Head:    Normocephalic, without obvious abnormality, atraumatic  Eyes:    PERRL, conjunctiva/corneas clear, EOM's intact, fundi    benign, both eyes  Ears:    Normal TM's and external ear canals, both ears  Nose:   Nares normal, septum midline, mucosa normal, no drainage    or sinus tenderness  Throat:   Lips, mucosa, and tongue normal; teeth and gums normal  Neck:   Supple, symmetrical, trachea midline, no adenopathy;   thyroid:  no enlargement/tenderness/nodules; no carotid   bruit or JVD  Back:     Symmetric, no curvature, ROM normal, no CVA tenderness  Lungs:     Clear to auscultation bilaterally, respirations unlabored  Chest Wall:    No tenderness or deformity   Heart:    Regular rate and rhythm, S1 and S2 normal, no murmur, rub   or gallop  Breast Exam:    No tenderness, masses, or nipple abnormality  Abdomen:     Soft, non-tender, bowel sounds active all four quadrants,    no masses, no organomegaly  Genitalia:    Normal female without lesion, discharge or tenderness     Extremities:   Extremities normal, atraumatic, no cyanosis or edema  Pulses:   2+ and symmetric all extremities  Skin:   Skin color, texture, turgor normal, no rashes or lesions  Lymph nodes:   Cervical, supraclavicular, and axillary nodes normal  Neurologic:   CNII-XII intact, normal strength, sensation and reflexes    throughout  .    Assessment:    Healthy female exam.   Depression/anxiety   Plan:     Thin prep Pap smear.  with cotesting Refer to psychiatry Mammogram ordered

## 2018-10-27 ENCOUNTER — Other Ambulatory Visit: Payer: Self-pay | Admitting: Physician Assistant

## 2018-10-27 ENCOUNTER — Other Ambulatory Visit: Payer: Self-pay | Admitting: Family Medicine

## 2018-10-29 LAB — CYTOLOGY - PAP
Diagnosis: NEGATIVE
HPV: NOT DETECTED

## 2018-11-01 ENCOUNTER — Other Ambulatory Visit: Payer: Self-pay | Admitting: *Deleted

## 2018-11-01 DIAGNOSIS — F32A Depression, unspecified: Secondary | ICD-10-CM

## 2018-11-01 DIAGNOSIS — F329 Major depressive disorder, single episode, unspecified: Secondary | ICD-10-CM

## 2018-11-10 ENCOUNTER — Encounter: Payer: Self-pay | Admitting: Family Medicine

## 2018-11-11 ENCOUNTER — Other Ambulatory Visit: Payer: Self-pay

## 2018-11-11 ENCOUNTER — Institutional Professional Consult (permissible substitution): Payer: BC Managed Care – PPO | Admitting: Psychiatry

## 2018-11-12 NOTE — Telephone Encounter (Addendum)
Form is in basket on Lisa's desk pending response from pt.

## 2018-11-12 NOTE — Telephone Encounter (Signed)
Work accommodation form filled out and in [employer].

## 2018-11-14 ENCOUNTER — Ambulatory Visit (INDEPENDENT_AMBULATORY_CARE_PROVIDER_SITE_OTHER): Payer: BC Managed Care – PPO | Admitting: Psychiatry

## 2018-11-14 ENCOUNTER — Other Ambulatory Visit: Payer: Self-pay

## 2018-11-14 ENCOUNTER — Encounter: Payer: Self-pay | Admitting: Psychiatry

## 2018-11-14 DIAGNOSIS — Z5329 Procedure and treatment not carried out because of patient's decision for other reasons: Secondary | ICD-10-CM

## 2018-11-14 NOTE — Progress Notes (Signed)
No response to call 

## 2018-11-18 ENCOUNTER — Ambulatory Visit: Payer: Self-pay | Admitting: Psychiatry

## 2018-11-18 NOTE — Telephone Encounter (Signed)
Placed form at front office.  Notified pt via [place]. Made copy to be scanned.

## 2018-11-20 NOTE — Telephone Encounter (Signed)
Spoke with pt asking about her form.  Says she got to our office after we closed yesterday. But she will pick it up today.

## 2018-11-20 NOTE — Telephone Encounter (Signed)
[facility] - Client Nonclinical Telephone Record AccessNurse Client [facility] - Client Client Site [place] Physician Ria Bush - MD Contact Type Call Who Is Calling Patient / Member / Family / Caregiver Caller Name [place] Phone Number [phone] Patient Name Tamara Shaffer Patient DOB 1975/06/08 Call Type Message Only Information Provided Reason for Call Request for General Office Information Initial Comment Caller says that there is form for her at the front desk an she is there. Was hoping she could let them know. The office is closed. Additional Comment Call Closed By: Norton Blizzard Transaction Date/Time: 11/19/2018 5:06:47 PM (ET)

## 2018-12-03 ENCOUNTER — Ambulatory Visit: Payer: Self-pay | Admitting: Urology

## 2018-12-15 ENCOUNTER — Other Ambulatory Visit: Payer: Self-pay | Admitting: Physician Assistant

## 2018-12-23 ENCOUNTER — Other Ambulatory Visit: Payer: Self-pay | Admitting: Family Medicine

## 2019-01-14 ENCOUNTER — Other Ambulatory Visit: Payer: Self-pay

## 2019-01-14 ENCOUNTER — Encounter: Payer: Self-pay | Admitting: Urology

## 2019-01-14 ENCOUNTER — Ambulatory Visit (INDEPENDENT_AMBULATORY_CARE_PROVIDER_SITE_OTHER): Payer: BC Managed Care – PPO | Admitting: Urology

## 2019-01-14 VITALS — BP 126/87 | HR 84 | Ht 63.0 in | Wt 237.0 lb

## 2019-01-14 DIAGNOSIS — N393 Stress incontinence (female) (male): Secondary | ICD-10-CM

## 2019-01-14 DIAGNOSIS — N3946 Mixed incontinence: Secondary | ICD-10-CM

## 2019-01-14 LAB — URINALYSIS, COMPLETE
Bilirubin, UA: NEGATIVE
Leukocytes,UA: NEGATIVE
Nitrite, UA: NEGATIVE
Protein,UA: NEGATIVE
RBC, UA: NEGATIVE
Specific Gravity, UA: 1.025 (ref 1.005–1.030)
Urobilinogen, Ur: 0.2 mg/dL (ref 0.2–1.0)
pH, UA: 5.5 (ref 5.0–7.5)

## 2019-01-14 LAB — MICROSCOPIC EXAMINATION
Bacteria, UA: NONE SEEN
RBC, Urine: NONE SEEN /hpf (ref 0–2)

## 2019-01-14 LAB — BLADDER SCAN AMB NON-IMAGING

## 2019-01-14 NOTE — Progress Notes (Signed)
01/14/2019 6:20 PM   Tamara Shaffer 06/18/1975 [id number]  Referring provider: Eustaquio Boyden, MD [address] [place],  [place] [address]  Chief Complaint  Patient presents with  . Urinary Incontinence    New Patient    HPI: [age] year old female who presents today for further evaluation of urinary incontinence.  She reports that she has had fairly significant bothersome stress urinary incontinence over the past 8+ years.  She has 2 children her oldest is 80 years old.  She recalls having stress incontinence after her first was born which worsened after her second child.  She has been seen and evaluated by Dr. Achilles Dunk in the past and managed with imipramine which did not help significantly.  She reports today that her symptoms are extremely bothersome.  She leaks with movement, twisting, standing, laughing coughing sneezing.  She avoids doing physical activity yoga, etc. as she knows that this can exacerbate her symptoms.  She wears about 2 pads per day.  The leakage impacts her sex life as she leaks some during intercourse.  She also is very concerned about a chronic urine smell.  Her symptoms have been stable over the past several years.  She denies any daytime urgency or frequency.  She denies any significant nocturia.  She has seen gynecology in the past and discussed "bladder tack" but was advised to hold off until she is 100% certain that she has not been have any more children.  She is certain that she and her partner are done having kids at this point.  She is ready move forward with some sort of intervention.  She has tried on multiple occasions to lose weight and been working with her primary care physician on this.  She has been successful at losing 30 pounds at a time but then regaining it on various fad diets.  She briefly considered bariatric surgery but has elected against this for many reasons.  She denies any significant pain with intercourse.  Rare urinary  tract infections.  She denies any symptoms of pelvic organ prolapse including no vaginal bulging or discomfort.  No constipation.  She has done pelvic floor exercises independently but never been referred to physical therapy.   PMH: Past Medical History:  Diagnosis Date  . Allergic rhinitis    seasonal  . Anxiety   . Depression   . GDM (gestational diabetes mellitus)   . GERD (gastroesophageal reflux disease)   . Hypothyroidism    medically treated, likely initially thyroiditis (heterogeneous gland on [place] 05/2015)  . Insomnia   . 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 Obesity   . Prediabetes    A1c 5.8% 11/2010  . Urinary incontinence   . Vulvar burning     Surgical History: Past Surgical History:  Procedure Laterality Date  . BREAST SURGERY  06/1994   Reconstruction  . CHOLECYSTECTOMY    . ECTOPIC PREGNANCY SURGERY  10/2008   Tube remained  . REDUCTION MAMMAPLASTY Left 1996  . thyroid US  06/2010   heterogeneous normal sized thyroid gland consistent with chronic thyroiditis    Home Medications:  Allergies as of 01/14/2019      Reactions   Erythromycin    REACTION: GI upset, rash, SOB   Penicillins    REACTION: as child, ok with amoxicillin in past      Medication List       Accurate as of January 14, 2019  6:20 PM. If you have any questions, ask your nurse or doctor.        STOP taking these medications   cyclobenzaprine 10 MG tablet Commonly known as: FLEXERIL Stopped by: Hollice Espy, MD   promethazine 25 MG tablet Commonly known as: PHENERGAN Stopped by: Hollice Espy, MD     TAKE these medications   cyanocobalamin 1000 MCG tablet Take 1,000 mcg by mouth daily.   hydrochlorothiazide 25 MG tablet Commonly known as: HYDRODIURIL TAKE 1 TABLET (25 MG TOTAL) BY MOUTH DAILY AS NEEDED (LEG SWELLING).   levonorgestrel 20 MCG/24HR IUD Commonly known as: MIRENA 1 each by Intrauterine route once.    loratadine 10 MG tablet Commonly known as: CLARITIN Take 10 mg by mouth daily.   metoprolol tartrate 25 MG tablet Commonly known as: LOPRESSOR Take 1 tablet (25 mg total) by mouth every morning.   multivitamin tablet Take 1 tablet by mouth daily.   omeprazole 20 MG capsule Commonly known as: PRILOSEC Take 1 capsule (20 mg total) by mouth daily.   SUMAtriptan 100 MG tablet Commonly known as: IMITREX Take 1 tablet (100 mg total) by mouth once as needed for migraine.   Synthroid 125 MCG tablet Generic drug: levothyroxine TAKE 1 TABLET BY MOUTH EVERY DAY BEFORE BREAKFAST   topiramate 100 MG tablet Commonly known as: TOPAMAX Take 1 tablet (100 mg total) by mouth at bedtime.   venlafaxine XR 75 MG 24 hr capsule Commonly known as: EFFEXOR-XR TAKE 1 CAPSULE (75 MG TOTAL) BY MOUTH 3 (THREE) TIMES DAILY.   Vitamin D 50 MCG (2000 UT) Caps Take 1 capsule (2,000 Units total) by mouth daily.       Allergies:  Allergies  Allergen Reactions  . Erythromycin     REACTION: GI upset, rash, SOB  . Penicillins     REACTION: as child, ok with amoxicillin in past    Family History: Family History  Problem Relation Age of Onset  . Cancer Mother        leiomyosarcoma of abdomen  . Hypertension Father   . Heart disease Father        Porcine Valve Replacement  . Cancer Maternal Grandmother        Ovarian cancer  . Cancer Cousin        Thyroid cancer  . Diabetes Neg Hx   . Stroke Neg Hx     Social History:  reports that she has never smoked. She has never used smokeless tobacco. She reports previous alcohol use. She reports that she does not use drugs.  ROS: UROLOGY Frequent Urination?: No Hard to postpone urination?: No Burning/pain with urination?: No Get up at night to urinate?: No Leakage of urine?: Yes Urine stream starts and stops?: No Trouble starting stream?: No Do you have to strain to urinate?: No Blood in urine?: No Urinary tract infection?: No Sexually  transmitted disease?: No Injury to kidneys or bladder?: No Painful intercourse?: No Weak stream?: No Currently pregnant?: No Vaginal bleeding?: No Last menstrual period?: n  Gastrointestinal Nausea?: No Vomiting?: No Indigestion/heartburn?: Yes Diarrhea?: No Constipation?: No  Constitutional Fever: No Night sweats?: Yes Weight loss?: No Fatigue?: Yes  Skin Skin rash/lesions?: No Itching?: No  Eyes Blurred vision?: No Double vision?: No  Ears/Nose/Throat Sore throat?: No Sinus problems?: No  Hematologic/Lymphatic Swollen glands?: No Easy bruising?: No  Cardiovascular Leg swelling?: No Chest pain?: No  Respiratory Cough?: No Shortness of breath?: No  Endocrine Excessive thirst?: No  Musculoskeletal Back pain?: No Joint pain?:  Yes  Neurological Headaches?: Yes Dizziness?: No  Psychologic Depression?: Yes Anxiety?: Yes  Physical Exam: BP 126/87   Pulse 84   Ht 5\' 3"  (1.6 m)   Wt 237 lb (107.5 kg)   BMI 41.98 kg/m   Constitutional:  Alert and oriented, No acute distress. HEENT: [place] AT, moist mucus membranes.  Trachea midline, no masses. Cardiovascular: No clubbing, cyanosis, or edema. Abdomen: Obese Respiratory: Normal respiratory effort, no increased work of breathing. Skin: No rashes, bruises or suspicious lesions. Neurologic: Grossly intact, no focal deficits, moving all 4 extremities. Psychiatric: Normal mood and affect.  Laboratory Data: Lab Results  Component Value Date   WBC 9.2 10/16/2018   WBC 9.0 10/16/2018   HGB 13.8 10/16/2018   HGB 13.8 10/16/2018   HCT 41.4 10/16/2018   HCT 40.5 10/16/2018   MCV 80 10/16/2018   MCV 78 (L) 10/16/2018   PLT 281 10/16/2018   PLT 282 10/16/2018    Lab Results  Component Value Date   CREATININE 0.91 09/27/2018     Lab Results  Component Value Date   HGBA1C 5.4 11/06/2013    Urinalysis Results for orders placed or performed in visit on 01/14/19  Microscopic Examination   URINE   Result Value Ref Range   WBC, UA 0-5 0 - 5 /hpf   RBC None seen 0 - 2 /hpf   Epithelial Cells (non renal) 0-10 0 - 10 /hpf   Bacteria, UA None seen None seen/Few  Urinalysis, Complete  Result Value Ref Range   Specific Gravity, UA 1.025 1.005 - 1.030   pH, UA 5.5 5.0 - 7.5   Color, UA Yellow Yellow   Appearance Ur Hazy (A) Clear   Leukocytes,UA Negative Negative   Protein,UA Negative Negative/Trace   Glucose, UA Trace (A) Negative   Ketones, UA Trace (A) Negative   RBC, UA Negative Negative   Bilirubin, UA Negative Negative   Urobilinogen, Ur 0.2 0.2 - 1.0 mg/dL   Nitrite, UA Negative Negative   Microscopic Examination See below:   BLADDER SCAN AMB NON-IMAGING  Result Value Ref Range   Scan Result 0ml     Assessment & Plan:    1. Stress incontinence Symptomatic stress urinary incontinence with significant bother  She has tried conservative interventions including pelvic floor exercises and weight loss but this is not made any significant improvement in her symptoms today.  We did have a lengthy discussion today about whether or not she would ever consider pursuing bariatric surgery to help with her weight loss efforts but she is adamant that she is consider this at length and not interested.  We discussed more aggressive options including mid urethral sling placement, urethral bulking agents amongst others.  We discussed risk and benefits of both.  I like her to see my partner, Dr. Lorin PicketScott MacDiarmid who specializes in the sorts of procedures.  Pelvic exam was deferred today till next visit.  - Urinalysis, Complete - BLADDER SCAN AMB NON-IMAGING   Return for MacDiarmid next availible.  Vanna ScotlandAshley Cyenna Rebello, MD  [facility] [address], [address] [place], [place] [address] [phone]

## 2019-01-21 ENCOUNTER — Other Ambulatory Visit: Payer: Self-pay | Admitting: Physician Assistant

## 2019-02-18 ENCOUNTER — Other Ambulatory Visit: Payer: Self-pay | Admitting: Physician Assistant

## 2019-02-24 ENCOUNTER — Encounter: Payer: Self-pay | Admitting: Urology

## 2019-02-24 ENCOUNTER — Ambulatory Visit (INDEPENDENT_AMBULATORY_CARE_PROVIDER_SITE_OTHER): Payer: BC Managed Care – PPO | Admitting: Urology

## 2019-02-24 ENCOUNTER — Other Ambulatory Visit: Payer: Self-pay

## 2019-02-24 VITALS — BP 132/79 | HR 84 | Ht 63.0 in | Wt 237.0 lb

## 2019-02-24 DIAGNOSIS — N3946 Mixed incontinence: Secondary | ICD-10-CM | POA: Diagnosis not present

## 2019-02-24 NOTE — Progress Notes (Signed)
02/24/2019 9:14 AM   Tamara Shaffer 1975-04-30 [id number]  Referring provider: Ria Bush, MD [address] [place],  [place] [address]  Chief Complaint  Patient presents with  . Follow-up    HPI: I was consulted to assist the patient is urinary continence worsening over many years.  She leaks with coughing sneezing bending lifting.  She leaks with intercourse.  She does not have urge incontinence.  She states she leaks all the time.  She wears 1 or 2 pads a day.  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  She has mild bedwetting.  She states she leaks a small amount when she goes from a sitting to standing position.  She leaks during intercourse but does not feel urgency and it does not necessarily with thrusting.  She has no prolapse symptoms  She voids every 2 hours and has good flow.  No nocturia.  Tried imipramine years ago  She denies history kidney stones previous to surgery and urinary tract infection.  No neurologic issues.  No hysterectomy  Modifying factors: There are no other modifying factors  Associated signs and symptoms: There are no other associated signs and symptoms Aggravating and relieving factors: There are no other aggravating or relieving factors Severity: Moderate Duration: Persistent   PMH: Past Medical History:  Diagnosis Date  . Allergic rhinitis    seasonal  . Anxiety   . Depression   . GDM (gestational diabetes mellitus)   . GERD (gastroesophageal reflux disease)   . Hypothyroidism    medically treated, likely initially thyroiditis (heterogeneous gland on [place] 05/2015)  . Insomnia   . Low bladder compliance    Imprimus Uro (Dr. Jacqlyn Larsen)  stress incontinence, on imipramine  . Obesity   . Prediabetes    A1c 5.8% 11/2010  . Urinary incontinence   . Vulvar burning     Surgical History: Past Surgical History:  Procedure Laterality Date  . BREAST SURGERY  06/1994   Reconstruction  . CHOLECYSTECTOMY    .  ECTOPIC PREGNANCY SURGERY  10/2008   Tube remained  . REDUCTION MAMMAPLASTY Left 1996  . thyroid US  06/2010   heterogeneous normal sized thyroid gland consistent with chronic thyroiditis    Home Medications:  Allergies as of 02/24/2019      Reactions   Erythromycin    REACTION: GI upset, rash, SOB   Penicillins    REACTION: as child, ok with amoxicillin in past      Medication List       Accurate as of February 24, 2019  9:14 AM. If you have any questions, ask your nurse or doctor.        cyanocobalamin 1000 MCG tablet Take 1,000 mcg by mouth daily.   hydrochlorothiazide 25 MG tablet Commonly known as: HYDRODIURIL TAKE 1 TABLET (25 MG TOTAL) BY MOUTH DAILY AS NEEDED (LEG SWELLING).   levonorgestrel 20 MCG/24HR IUD Commonly known as: MIRENA 1 each by Intrauterine route once.   loratadine 10 MG tablet Commonly known as: CLARITIN Take 10 mg by mouth daily.   metoprolol tartrate 25 MG tablet Commonly known as: LOPRESSOR Take 1 tablet (25 mg total) by mouth every morning.   multivitamin tablet Take 1 tablet by mouth daily.   omeprazole 20 MG capsule Commonly known as: PRILOSEC TAKE 1 CAPSULE BY MOUTH EVERY DAY   SUMAtriptan 100 MG tablet Commonly known as: IMITREX Take 1 tablet (100 mg total) by mouth once  as needed for migraine.   Synthroid 125 MCG tablet Generic drug: levothyroxine TAKE 1 TABLET BY MOUTH EVERY DAY BEFORE BREAKFAST   topiramate 100 MG tablet Commonly known as: TOPAMAX Take 1 tablet (100 mg total) by mouth at bedtime.   venlafaxine XR 75 MG 24 hr capsule Commonly known as: EFFEXOR-XR TAKE 1 CAPSULE (75 MG TOTAL) BY MOUTH 3 (THREE) TIMES DAILY.   Vitamin D 50 MCG (2000 UT) Caps Take 1 capsule (2,000 Units total) by mouth daily.       Allergies:  Allergies  Allergen Reactions  . Erythromycin     REACTION: GI upset, rash, SOB  . Penicillins     REACTION: as child, ok with amoxicillin in past    Family History: Family History   Problem Relation Age of Onset  . Cancer Mother        leiomyosarcoma of abdomen  . Hypertension Father   . Heart disease Father        Porcine Valve Replacement  . Cancer Maternal Grandmother        Ovarian cancer  . Cancer Cousin        Thyroid cancer  . Diabetes Neg Hx   . Stroke Neg Hx     Social History:  reports that she has never smoked. She has never used smokeless tobacco. She reports previous alcohol use. She reports that she does not use drugs.  ROS: UROLOGY Frequent Urination?: No Hard to postpone urination?: No Burning/pain with urination?: No Get up at night to urinate?: No Leakage of urine?: Yes Urine stream starts and stops?: No Trouble starting stream?: No Do you have to strain to urinate?: No Blood in urine?: No Urinary tract infection?: No Sexually transmitted disease?: No Injury to kidneys or bladder?: No Painful intercourse?: No Weak stream?: No Currently pregnant?: No Vaginal bleeding?: No Last menstrual period?: n  Gastrointestinal Nausea?: No Vomiting?: No Indigestion/heartburn?: No Diarrhea?: No Constipation?: No  Constitutional Fever: No Night sweats?: Yes Weight loss?: No Fatigue?: No  Skin Skin rash/lesions?: No Itching?: No  Eyes Blurred vision?: No Double vision?: No  Ears/Nose/Throat Sore throat?: No Sinus problems?: No  Hematologic/Lymphatic Swollen glands?: No Easy bruising?: No  Cardiovascular Leg swelling?: No Chest pain?: No  Respiratory Cough?: No Shortness of breath?: No  Endocrine Excessive thirst?: No  Musculoskeletal Back pain?: No Joint pain?: No  Neurological Headaches?: Yes Dizziness?: No  Psychologic Depression?: Yes Anxiety?: Yes  Physical Exam: BP 132/79   Pulse 84   Ht 5\' 3"  (1.6 m)   Wt 107.5 kg   BMI 41.98 kg/m   Constitutional:  Alert and oriented, No acute distress. HEENT: [place] AT, moist mucus membranes.  Trachea midline, no masses. Cardiovascular: No clubbing, cyanosis,  or edema. Respiratory: Normal respiratory effort, no increased work of breathing. GI: Abdomen is soft, nontender, nondistended, no abdominal masses GU: On pelvic examination patient had grade 2 hypermobility the bladder neck and negative cough test.  She had a high small grade 2 cystocele.  It would not descend with a cough.  She could cause some rotational descent when she would bear down.  There would be no risk of hinge effect.  It did not reach the urethrovesical angle Skin: No rashes, bruises or suspicious lesions. Lymph: No cervical or inguinal adenopathy. Neurologic: Grossly intact, no focal deficits, moving all 4 extremities. Psychiatric: Normal mood and affect.  Laboratory Data: Lab Results  Component Value Date   WBC 9.2 10/16/2018   WBC 9.0 10/16/2018   HGB  13.8 10/16/2018   HGB 13.8 10/16/2018   HCT 41.4 10/16/2018   HCT 40.5 10/16/2018   MCV 80 10/16/2018   MCV 78 (L) 10/16/2018   PLT 281 10/16/2018   PLT 282 10/16/2018    Lab Results  Component Value Date   CREATININE 0.91 09/27/2018    No results found for: PSA  No results found for: TESTOSTERONE  Lab Results  Component Value Date   HGBA1C 5.4 11/06/2013    Urinalysis    Component Value Date/Time   COLORURINE YELLOW 10/24/2008 0231   APPEARANCEUR Hazy (A) 01/14/2019 1500   LABSPEC >1.030 (H) 10/24/2008 0231   PHURINE 5.5 10/24/2008 0231   GLUCOSEU Trace (A) 01/14/2019 1500   HGBUR LARGE (A) 10/24/2008 0231   HGBUR negative 03/09/2008 1604   BILIRUBINUR Negative 01/14/2019 1500   KETONESUR 15 (A) 10/24/2008 0231   PROTEINUR Negative 01/14/2019 1500   PROTEINUR NEGATIVE 10/24/2008 0231   UROBILINOGEN 0.2 07/23/2017 1606   UROBILINOGEN 0.2 10/24/2008 0231   NITRITE Negative 01/14/2019 1500   NITRITE NEGATIVE 10/24/2008 0231   LEUKOCYTESUR Negative 01/14/2019 1500    Pertinent Imaging:   Assessment & Plan: Patient has mixed incontinence.  She may have an element of an overactive bladder because  of the bedwetting.  Having said that it does sound primarily a stress incontinence and she can leak getting up from a chair.  Be interesting to see if she has an overactive bladder as well.  Role of urodynamics discussed  There are no diagnoses linked to this encounter.  No follow-ups on file.  Martina Sinner, MD  [facility] [address], [address] [place], [place] [address] [phone]

## 2019-02-25 NOTE — Addendum Note (Signed)
Addended by: Verlene Mayer A on: 02/25/2019 02:29 PM   Modules accepted: Orders

## 2019-04-07 ENCOUNTER — Other Ambulatory Visit: Payer: Self-pay | Admitting: Urology

## 2019-04-07 DIAGNOSIS — N3946 Mixed incontinence: Secondary | ICD-10-CM

## 2019-04-07 DIAGNOSIS — N393 Stress incontinence (female) (male): Secondary | ICD-10-CM

## 2019-04-21 ENCOUNTER — Other Ambulatory Visit: Payer: Self-pay

## 2019-04-21 ENCOUNTER — Encounter: Payer: Self-pay | Admitting: Urology

## 2019-04-21 ENCOUNTER — Ambulatory Visit: Payer: BC Managed Care – PPO | Admitting: Urology

## 2019-04-21 VITALS — BP 130/89 | HR 99 | Ht 63.0 in | Wt 237.0 lb

## 2019-04-21 DIAGNOSIS — N3946 Mixed incontinence: Secondary | ICD-10-CM | POA: Diagnosis not present

## 2019-04-21 MED ORDER — OXYBUTYNIN CHLORIDE ER 10 MG PO TB24: 10.0000 mg | ORAL_TABLET | Freq: Every day | ORAL | 11 refills | Status: DC

## 2019-04-21 MED ORDER — MIRABEGRON ER 50 MG PO TB24: 50.0000 mg | ORAL_TABLET | Freq: Every day | ORAL | 11 refills | Status: DC

## 2019-04-21 NOTE — Progress Notes (Signed)
04/21/2019 1:45 PM   Tamara Shaffer 1975/08/26 [id number]  Referring provider: Eustaquio Boyden, MD [address] [place],  [place] [address]  Chief Complaint  Patient presents with  . Follow-up    HPI: I was consulted to assist the patient is urinary continence worsening over many years.  She leaks with coughing sneezing bending lifting.  She leaks with intercourse.  She does not have urge incontinence.  She states she leaks all the time.  She wears 1 or 2 pads a day.  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  She has mild bedwetting.  She states she leaks a small amount when she goes from a sitting to standing position.  She leaks during intercourse but does not feel urgency and it does not necessarily with thrusting.  She has no prolapse symptoms  She voids every 2 hours and has good flow.  No nocturia.    No hysterectomy  On pelvic examination patient had grade 2 hypermobility the bladder neck and negative cough test.  She had a high small grade 2 cystocele.  It would not descend with a cough.  She could cause some rotational descent when she would bear down.  There would be no risk of hinge effect.  It did not reach the urethrovesical angle  Patient has mixed incontinence.  She may have an element of an overactive bladder because of the bedwetting.  Having said that it does sound primarily a stress incontinence and she can leak getting up from a chair.  Be interesting to see if she has an overactive bladder as well.   Today Frequency stable.  Incontinence stable.  On urodynamics she did not void for 3 hours and was catheterized for 125 mL.  Maximum bladder capacity was 900 mL.  Bladder was hyposensitive.  She had low pressure overactivity reaching a pressure of 6 cm of water.  She did not leak.  At 500 mL she had moderate stress incontinence with leak point pressure 94 cm of water.  Her cough leak point pressure was 86 cm of water with mild  leakage.  It was difficult for her to void in the lab setting.  She indurated voluntary contraction at 12 cm of water.  She voided 50 mL with a maximum flow of 54 mils per second.  Maximum voiding pressure 12 cm water.  Residual 50 mL.  Bladder neck descended 2 to 3 cm.  She did not realize that she was straining to void.  She does have a small moderate cystocele.   The patient has a mixed picture and I will treat her overactive bladder first with behavioral medical therapy especially with her having mild bedwetting.  Based on urodynamics and her voiding she appears to be at high risk of retention and a urethral injectable may be the best option for her.  Persistent overactive bladder symptoms and leakage with intercourse could persist with the stress incontinence procedure.  The role of physical therapy was emphasized      PMH: Past Medical History:  Diagnosis Date  . Allergic rhinitis    seasonal  . Anxiety   . Depression   . GDM (gestational diabetes mellitus)   . GERD (gastroesophageal reflux disease)   . Hypothyroidism    medically treated, likely initially thyroiditis (heterogeneous gland on [place] 05/2015)  . Insomnia   . 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  Obesity   . Prediabetes    A1c 5.8% 11/2010  . Urinary incontinence   . Vulvar burning     Surgical History: Past Surgical History:  Procedure Laterality Date  . BREAST SURGERY  06/1994   Reconstruction  . CHOLECYSTECTOMY    . ECTOPIC PREGNANCY SURGERY  10/2008   Tube remained  . REDUCTION MAMMAPLASTY Left 1996  . thyroid US  06/2010   heterogeneous normal sized thyroid gland consistent with chronic thyroiditis    Home Medications:  Allergies as of 04/21/2019      Reactions   Erythromycin    REACTION: GI upset, rash, SOB   Penicillins    REACTION: as child, ok with amoxicillin in past      Medication List       Accurate as of April 21, 2019  1:45 PM. If you  have any questions, ask your nurse or doctor.        cyanocobalamin 1000 MCG tablet Take 1,000 mcg by mouth daily.   hydrochlorothiazide 25 MG tablet Commonly known as: HYDRODIURIL TAKE 1 TABLET (25 MG TOTAL) BY MOUTH DAILY AS NEEDED (LEG SWELLING).   levonorgestrel 20 MCG/24HR IUD Commonly known as: MIRENA 1 each by Intrauterine route once.   loratadine 10 MG tablet Commonly known as: CLARITIN Take 10 mg by mouth daily.   metoprolol tartrate 25 MG tablet Commonly known as: LOPRESSOR Take 1 tablet (25 mg total) by mouth every morning.   multivitamin tablet Take 1 tablet by mouth daily.   omeprazole 20 MG capsule Commonly known as: PRILOSEC TAKE 1 CAPSULE BY MOUTH EVERY DAY   SUMAtriptan 100 MG tablet Commonly known as: IMITREX Take 1 tablet (100 mg total) by mouth once as needed for migraine.   Synthroid 125 MCG tablet Generic drug: levothyroxine TAKE 1 TABLET BY MOUTH EVERY DAY BEFORE BREAKFAST   topiramate 100 MG tablet Commonly known as: TOPAMAX Take 1 tablet (100 mg total) by mouth at bedtime.   venlafaxine XR 75 MG 24 hr capsule Commonly known as: EFFEXOR-XR TAKE 1 CAPSULE (75 MG TOTAL) BY MOUTH 3 (THREE) TIMES DAILY.   Vitamin D 50 MCG (2000 UT) Caps Take 1 capsule (2,000 Units total) by mouth daily.       Allergies:  Allergies  Allergen Reactions  . Erythromycin     REACTION: GI upset, rash, SOB  . Penicillins     REACTION: as child, ok with amoxicillin in past    Family History: Family History  Problem Relation Age of Onset  . Cancer Mother        leiomyosarcoma of abdomen  . Hypertension Father   . Heart disease Father        Porcine Valve Replacement  . Cancer Maternal Grandmother        Ovarian cancer  . Cancer Cousin        Thyroid cancer  . Diabetes Neg Hx   . Stroke Neg Hx     Social History:  reports that she has never smoked. She has never used smokeless tobacco. She reports previous alcohol use. She reports that she does  not use drugs.  ROS: UROLOGY Frequent Urination?: No Hard to postpone urination?: No Burning/pain with urination?: No Get up at night to urinate?: No Leakage of urine?: Yes Urine stream starts and stops?: No Trouble starting stream?: No Do you have to strain to urinate?: No Blood in urine?: No Urinary tract infection?: No Sexually transmitted disease?: No Injury to kidneys or bladder?: No Painful intercourse?:  No Weak stream?: No Currently pregnant?: No Vaginal bleeding?: No Last menstrual period?: n  Gastrointestinal Nausea?: No Vomiting?: No Indigestion/heartburn?: No Diarrhea?: No Constipation?: No  Constitutional Fever: No Night sweats?: No Weight loss?: No Fatigue?: No  Skin Skin rash/lesions?: No Itching?: No  Eyes Blurred vision?: No Double vision?: No  Ears/Nose/Throat Sore throat?: No Sinus problems?: No  Hematologic/Lymphatic Swollen glands?: No Easy bruising?: No  Cardiovascular Leg swelling?: No Chest pain?: No  Respiratory Cough?: No Shortness of breath?: No  Endocrine Excessive thirst?: No  Musculoskeletal Back pain?: No Joint pain?: No  Neurological Headaches?: Yes Dizziness?: Yes  Psychologic Depression?: Yes Anxiety?: Yes  Physical Exam: BP 130/89   Pulse 99   Ht 5\' 3"  (1.6 m)   Wt 237 lb (107.5 kg)   BMI 41.98 kg/m   Constitutional:  Alert and oriented, No acute distress.  Laboratory Data: Lab Results  Component Value Date   WBC 9.2 10/16/2018   WBC 9.0 10/16/2018   HGB 13.8 10/16/2018   HGB 13.8 10/16/2018   HCT 41.4 10/16/2018   HCT 40.5 10/16/2018   MCV 80 10/16/2018   MCV 78 (L) 10/16/2018   PLT 281 10/16/2018   PLT 282 10/16/2018    Lab Results  Component Value Date   CREATININE 0.91 09/27/2018    No results found for: PSA  No results found for: TESTOSTERONE  Lab Results  Component Value Date   HGBA1C 5.4 11/06/2013    Urinalysis    Component Value Date/Time   COLORURINE YELLOW  10/24/2008 0231   APPEARANCEUR Hazy (A) 01/14/2019 1500   LABSPEC >1.030 (H) 10/24/2008 0231   PHURINE 5.5 10/24/2008 0231   GLUCOSEU Trace (A) 01/14/2019 1500   HGBUR LARGE (A) 10/24/2008 0231   HGBUR negative 03/09/2008 1604   BILIRUBINUR Negative 01/14/2019 1500   KETONESUR 15 (A) 10/24/2008 0231   PROTEINUR Negative 01/14/2019 1500   PROTEINUR NEGATIVE 10/24/2008 0231   UROBILINOGEN 0.2 07/23/2017 1606   UROBILINOGEN 0.2 10/24/2008 0231   NITRITE Negative 01/14/2019 1500   NITRITE NEGATIVE 10/24/2008 0231   LEUKOCYTESUR Negative 01/14/2019 1500    Pertinent Imaging:   Assessment & Plan: Patient understands her mixed picture.  She understands that the leaking with intercourse is probably from overactivity but it is less clear.  She said this is less bothersome but the leakage when she moves and gets out of a chair it does bother her the most.  She held off on physical therapy.  I gave her oxybutynin ER 10 mg prescription and Myrbetriq 50 mg samples and prescription and reevaluate in 8 weeks.  If she still leaking I will discuss her large capacity poorly contractile bladder and the role of a sling but especially the role of a urethral bulking agent.  I mentioned her large bladder to her today  There are no diagnoses linked to this encounter.  No follow-ups on file.  Reece Packer, MD  [facility] [address], [place], [place] [address] [phone]

## 2019-04-21 NOTE — Addendum Note (Signed)
Addended by: Milas Kocher A on: 04/21/2019 02:04 PM   Modules accepted: Orders

## 2019-06-16 ENCOUNTER — Other Ambulatory Visit: Payer: Self-pay

## 2019-06-16 ENCOUNTER — Telehealth (INDEPENDENT_AMBULATORY_CARE_PROVIDER_SITE_OTHER): Payer: BC Managed Care – PPO | Admitting: Urology

## 2019-06-16 DIAGNOSIS — N3946 Mixed incontinence: Secondary | ICD-10-CM | POA: Diagnosis not present

## 2019-06-16 NOTE — Progress Notes (Signed)
  Virtual Visit via Telephone Note  I connected with Tamara Shaffer on @TODAY @ at  2:30 PM EDT by telephone and verified that I am speaking with the correct person using two identifiers.   I discussed the limitations, risks, security and privacy concerns of performing an evaluation and management service by telephone and the availability of in person appointments. We discussed the impact of the COVID-19 pandemic on the healthcare system, and the importance of social distancing and reducing patient and provider exposure. I also discussed with the patient that there may be a patient responsible charge related to this service. The patient expressed understanding and agreed to proceed.  Reason for visit: Incontinence   History of Present Illness: Patient has leaking with coughing sneezing bending.  She leaks with intercourse.  She leaks when she gets out of a chair.  She has mild bedwetting.  She can leak a small amount when she goes from a sitting to standing position.  Her bladder capacity was 900 mL.  She had low pressure bladder overactivity.  She had mild stress and constantly point pressure of 86 cm of water.  During voluntary voiding her contraction pressure is only 12 cmH2O but she could not get the flow started.  She finally had to strain to void.  Residual was 50 mL I was concerned about her having retention with a sling and felt a bulking agent was probably her best option for her milder incontinence.  I gave her a trial of oxybutynin and Myrbetriq.  There was scheduling issues today so I spoke to her by telephone  Patient is dramatically better on Myrbetriq and very pleased.  Much less leaking.  Frequency stable.  Clinically not infected   Assessment and Plan: Mixed incontinence doing well on Myrbetriq   Follow Up: Reassess 8 weeks    I discussed the assessment and treatment plan with the patient. The patient was provided an opportunity to ask questions and all were answered. The  patient agreed with the plan and demonstrated an understanding of the instructions.   The patient was advised to call back or seek an in-person evaluation if the symptoms worsen or if the condition fails to improve as anticipated.  I provided 14 minutes of non-face-to-face time during this encounter.  Thorough chart review   , MD

## 2019-06-19 ENCOUNTER — Encounter: Payer: Self-pay | Admitting: Family Medicine

## 2019-06-19 NOTE — Telephone Encounter (Signed)
Spoke with pt scheduling OV on 06/24/19 at 9:15.

## 2019-06-24 ENCOUNTER — Other Ambulatory Visit: Payer: Self-pay

## 2019-06-24 ENCOUNTER — Ambulatory Visit: Payer: BC Managed Care – PPO | Admitting: Family Medicine

## 2019-06-24 ENCOUNTER — Encounter: Payer: Self-pay | Admitting: Family Medicine

## 2019-06-24 VITALS — BP 118/74 | HR 70 | Temp 97.8°F | Ht 63.0 in | Wt 245.0 lb

## 2019-06-24 DIAGNOSIS — R5383 Other fatigue: Secondary | ICD-10-CM

## 2019-06-24 DIAGNOSIS — E039 Hypothyroidism, unspecified: Secondary | ICD-10-CM

## 2019-06-24 DIAGNOSIS — F331 Major depressive disorder, recurrent, moderate: Secondary | ICD-10-CM

## 2019-06-24 DIAGNOSIS — D649 Anemia, unspecified: Secondary | ICD-10-CM

## 2019-06-24 DIAGNOSIS — N912 Amenorrhea, unspecified: Secondary | ICD-10-CM | POA: Diagnosis not present

## 2019-06-24 DIAGNOSIS — G43009 Migraine without aura, not intractable, without status migrainosus: Secondary | ICD-10-CM

## 2019-06-24 LAB — COMPREHENSIVE METABOLIC PANEL
ALT: 27 U/L (ref 0–35)
AST: 19 U/L (ref 0–37)
Albumin: 4.2 g/dL (ref 3.5–5.2)
Alkaline Phosphatase: 77 U/L (ref 39–117)
BUN: 14 mg/dL (ref 6–23)
CO2: 29 mEq/L (ref 19–32)
Calcium: 9.3 mg/dL (ref 8.4–10.5)
Chloride: 96 mEq/L (ref 96–112)
Creatinine, Ser: 0.89 mg/dL (ref 0.40–1.20)
GFR: 69.14 mL/min (ref >60.00)
Glucose, Bld: 119 mg/dL — ABNORMAL HIGH (ref 70–99)
Potassium: 3.4 mEq/L — ABNORMAL LOW (ref 3.5–5.1)
Sodium: 135 mEq/L (ref 135–145)
Total Bilirubin: 0.4 mg/dL (ref 0.2–1.2)
Total Protein: 7.8 g/dL (ref 6.0–8.3)

## 2019-06-24 LAB — IBC PANEL
Iron: 66 ug/dL (ref 42–145)
Saturation Ratios: 16.7 % — ABNORMAL LOW (ref 20.0–50.0)
Transferrin: 282 mg/dL (ref 212.0–360.0)

## 2019-06-24 LAB — CBC WITH DIFFERENTIAL/PLATELET
Basophils Absolute: 0 10*3/uL (ref 0.0–0.1)
Basophils Relative: 0.4 % (ref 0.0–3.0)
Eosinophils Absolute: 0.4 10*3/uL (ref 0.0–0.7)
Eosinophils Relative: 2.6 % (ref 0.0–5.0)
HCT: 41 % (ref 36.0–46.0)
Hemoglobin: 13.4 g/dL (ref 12.0–15.0)
Lymphocytes Relative: 21 % (ref 12.0–46.0)
Lymphs Abs: 2.8 10*3/uL (ref 0.7–4.0)
MCHC: 32.7 g/dL (ref 30.0–36.0)
MCV: 82.4 fl (ref 78.0–100.0)
Monocytes Absolute: 0.8 10*3/uL (ref 0.1–1.0)
Monocytes Relative: 5.7 % (ref 3.0–12.0)
Neutro Abs: 9.4 10*3/uL — ABNORMAL HIGH (ref 1.4–7.7)
Neutrophils Relative %: 70.3 % (ref 43.0–77.0)
Platelets: 292 10*3/uL (ref 150.0–400.0)
RBC: 4.97 Mil/uL (ref 3.87–5.11)
RDW: 14.9 % (ref 11.5–15.5)
WBC: 13.3 10*3/uL — ABNORMAL HIGH (ref 4.0–10.5)

## 2019-06-24 LAB — POCT URINE PREGNANCY: Preg Test, Ur: NEGATIVE

## 2019-06-24 LAB — VITAMIN D 25 HYDROXY (VIT D DEFICIENCY, FRACTURES): VITD: 45.75 ng/mL (ref 30.00–100.00)

## 2019-06-24 LAB — VITAMIN B12: Vitamin B-12: >1500 pg/mL — ABNORMAL HIGH (ref 211–911)

## 2019-06-24 LAB — T4, FREE: Free T4: 0.76 ng/dL (ref 0.60–1.60)

## 2019-06-24 LAB — SEDIMENTATION RATE: Sed Rate: 59 mm/hr — ABNORMAL HIGH (ref 0–20)

## 2019-06-24 LAB — TSH: TSH: 2.42 u[IU]/mL (ref 0.35–4.50)

## 2019-06-24 LAB — FERRITIN: Ferritin: 47.1 ng/mL (ref 10.0–291.0)

## 2019-06-24 MED ORDER — METOPROLOL TARTRATE 25 MG PO TABS: 25.0000 mg | ORAL_TABLET | Freq: Every morning | ORAL | 0 refills | Status: DC

## 2019-06-24 NOTE — Progress Notes (Signed)
This visit was conducted in person.  BP 118/74 (BP Location: Left Arm, Patient Position: Sitting, Cuff Size: Large)   Pulse 70   Temp 97.8 F (36.6 C) (Temporal)   Ht 5\' 3"  (1.6 m)   Wt 245 lb (111.1 kg)   LMP 05/22/2019   SpO2 96%   BMI 43.40 kg/m    CC: HA, fatigue Subjective:    Patient ID: [id number], female    DOB: 06/06/1975, 44 y.o.   MRN: [id number]  HPI: Tamara Shaffer is a 44 y.o. female presenting on 06/24/2019 for Headache (C/o HA and fatigue all the time.  Started about 3 wks ago.  Not sure if stress related or may be BS relayed due to recent wt gain. ) and Hot Flashes (C/o hot flashes about every other day.  Started 2 wks ago. )   3 wk h/o headache with fatigue, 2 wk h/o hot flashes - 3-4/day. ?perimenopause. No fmhx early menopause.  Night sweats for 1 wk initially, now resolved.  Weight gain noted during COVID-19 pandemic.  Due for thyroid check.   Constant daily headache started 3 wks ago - needing sumatriptan in AM, then excedrin migraine, tylenol arthritis strength (temporary relief).  Headaches through weekends. She finds she does better if she hydrates very well. Describes dull R sided headache. No headache at end of day. Continues topamax 100mg  nightly preventatively. Continues venlafaxine 225mg  XR QD (on this for years) - planning to taper off effexor. Would like psych referral for this. Off metoprolol since Christmas (was on this for anxiety and migraine prevention).   Children have returned to school as of March 2021 - initially attributed above symptoms to increased stressors. However symptoms seem to be persisting.   Has Mirena - continues getting light periods LMP - normally regular, last period 04/24/2019, very light. Spotted x1 day on 05/22/2019.  Negative pregnancy test at home 06/16/2019  Endorses strong fmhx iron deficiency (2 sisters and father).      Relevant past medical, surgical, family and social history reviewed and updated as indicated.  Interim medical history since our last visit reviewed. Allergies and medications reviewed and updated. Outpatient Medications Prior to Visit  Medication Sig Dispense Refill  . Ascorbic Acid (VITAMIN C PO) Take by mouth daily.    . Cholecalciferol (VITAMIN D) 2000 units CAPS Take 1 capsule (2,000 Units total) by mouth daily. 30 capsule   . cyanocobalamin 1000 MCG tablet Take 1,000 mcg by mouth daily.    06/22/2019 ELDERBERRY PO Take by mouth daily.    . hydrochlorothiazide (HYDRODIURIL) 25 MG tablet TAKE 1 TABLET (25 MG TOTAL) BY MOUTH DAILY AS NEEDED (LEG SWELLING). 90 tablet 1  . levonorgestrel (MIRENA) 20 MCG/24HR IUD 1 each by Intrauterine route once.    . loratadine (CLARITIN) 10 MG tablet Take 10 mg by mouth daily.    . mirabegron ER (MYRBETRIQ) 50 MG TB24 tablet Take 1 tablet (50 mg total) by mouth daily. 30 tablet 11  . Multiple Vitamin (MULTIVITAMIN) tablet Take 1 tablet by mouth daily.      . Multiple Vitamins-Minerals (ZINC PO) Take by mouth daily.    07/22/2019 omeprazole (PRILOSEC) 20 MG capsule TAKE 1 CAPSULE BY MOUTH EVERY DAY 90 capsule 3  . SUMAtriptan (IMITREX) 100 MG tablet Take 1 tablet (100 mg total) by mouth once as needed for migraine. 27 tablet 4  . SYNTHROID 125 MCG tablet TAKE 1 TABLET BY MOUTH EVERY DAY BEFORE BREAKFAST 90 tablet 3  .  topiramate (TOPAMAX) 100 MG tablet Take 1 tablet (100 mg total) by mouth at bedtime. 30 tablet 1  . TURMERIC CURCUMIN PO Take by mouth daily.    Marland Kitchen venlafaxine XR (EFFEXOR-XR) 75 MG 24 hr capsule Take 3 capsules (225 mg total) by mouth daily with breakfast.    . metoprolol tartrate (LOPRESSOR) 25 MG tablet Take 1 tablet (25 mg total) by mouth every morning. 90 tablet 1  . oxybutynin (DITROPAN-XL) 10 MG 24 hr tablet Take 1 tablet (10 mg total) by mouth daily. 30 tablet 11  . venlafaxine XR (EFFEXOR-XR) 75 MG 24 hr capsule TAKE 1 CAPSULE (75 MG TOTAL) BY MOUTH 3 (THREE) TIMES DAILY.     Facility-Administered Medications Prior to Visit  Medication Dose  Route Frequency Provider Last Rate Last Admin  . diclofenac (VOLTAREN) EC tablet 75 mg  75 mg Oral BID Reva Bores, MD         Per HPI unless specifically indicated in ROS section below Review of Systems Objective:    BP 118/74 (BP Location: Left Arm, Patient Position: Sitting, Cuff Size: Large)   Pulse 70   Temp 97.8 F (36.6 C) (Temporal)   Ht 5\' 3"  (1.6 m)   Wt 245 lb (111.1 kg)   LMP 05/22/2019   SpO2 96%   BMI 43.40 kg/m   Wt Readings from Last 3 Encounters:  06/24/19 245 lb (111.1 kg)  04/21/19 237 lb (107.5 kg)  02/24/19 237 lb (107.5 kg)    Physical Exam Vitals and nursing note reviewed.  Constitutional:      Appearance: She is well-developed. She is obese. She is not ill-appearing.  HENT:     Head: Normocephalic and atraumatic.  Eyes:     General:        Right eye: No discharge.        Left eye: No discharge.     Extraocular Movements: Extraocular movements intact.     Conjunctiva/sclera: Conjunctivae normal.     Pupils: Pupils are equal, round, and reactive to light.  Neck:     Thyroid: No thyromegaly or thyroid tenderness.  Cardiovascular:     Rate and Rhythm: Normal rate and regular rhythm.     Pulses: Normal pulses.     Heart sounds: Normal heart sounds. No murmur.  Pulmonary:     Effort: Pulmonary effort is normal. No respiratory distress.     Breath sounds: Normal breath sounds. No wheezing, rhonchi or rales.  Abdominal:     General: Bowel sounds are normal. There is no distension.     Palpations: Abdomen is soft. There is no mass.     Tenderness: There is no abdominal tenderness. There is no right CVA tenderness, left CVA tenderness, guarding or rebound.     Hernia: No hernia is present.  Musculoskeletal:     Cervical back: Normal range of motion and neck supple. No rigidity.     Right lower leg: No edema.     Left lower leg: No edema.  Lymphadenopathy:     Cervical: No cervical adenopathy.  Skin:    General: Skin is warm and dry.      Findings: No rash.  Neurological:     General: No focal deficit present.  Psychiatric:        Mood and Affect: Mood normal.        Behavior: Behavior normal.       Results for orders placed or performed in visit on 06/24/19  Ferritin  Result Value  Ref Range   Ferritin 47.1 10.0 - 291.0 ng/mL  Vitamin B12  Result Value Ref Range   Vitamin B-12 >1500 (H) 211 - 911 pg/mL  IBC panel  Result Value Ref Range   Iron 66 42 - 145 ug/dL   Transferrin 924.2 683.4 - 360.0 mg/dL   Saturation Ratios 19.6 (L) 20.0 - 50.0 %  VITAMIN D 25 Hydroxy (Vit-D Deficiency, Fractures)  Result Value Ref Range   VITD 45.75 30.00 - 100.00 ng/mL  Sedimentation rate  Result Value Ref Range   Sed Rate 59 (H) 0 - 20 mm/hr  Comprehensive metabolic panel  Result Value Ref Range   Sodium 135 135 - 145 mEq/L   Potassium 3.4 (L) 3.5 - 5.1 mEq/L   Chloride 96 96 - 112 mEq/L   CO2 29 19 - 32 mEq/L   Glucose, Bld 119 (H) 70 - 99 mg/dL   BUN 14 6 - 23 mg/dL   Creatinine, Ser 2.22 0.40 - 1.20 mg/dL   Total Bilirubin 0.4 0.2 - 1.2 mg/dL   Alkaline Phosphatase 77 39 - 117 U/L   AST 19 0 - 37 U/L   ALT 27 0 - 35 U/L   Total Protein 7.8 6.0 - 8.3 g/dL   Albumin 4.2 3.5 - 5.2 g/dL   GFR 97.98 >92.11 mL/min   Calcium 9.3 8.4 - 10.5 mg/dL  TSH  Result Value Ref Range   TSH 2.42 0.35 - 4.50 uIU/mL  T4, free  Result Value Ref Range   Free T4 0.76 0.60 - 1.60 ng/dL  CBC with Differential/Platelet  Result Value Ref Range   WBC 13.3 (H) 4.0 - 10.5 K/uL   RBC 4.97 3.87 - 5.11 Mil/uL   Hemoglobin 13.4 12.0 - 15.0 g/dL   HCT 94.1 74.0 - 81.4 %   MCV 82.4 78.0 - 100.0 fl   MCHC 32.7 30.0 - 36.0 g/dL   RDW 48.1 85.6 - 31.4 %   Platelets 292.0 150.0 - 400.0 K/uL   Neutrophils Relative % 70.3 43.0 - 77.0 %   Lymphocytes Relative 21.0 12.0 - 46.0 %   Monocytes Relative 5.7 3.0 - 12.0 %   Eosinophils Relative 2.6 0.0 - 5.0 %   Basophils Relative 0.4 0.0 - 3.0 %   Neutro Abs 9.4 (H) 1.4 - 7.7 K/uL   Lymphs Abs 2.8  0.7 - 4.0 K/uL   Monocytes Absolute 0.8 0.1 - 1.0 K/uL   Eosinophils Absolute 0.4 0.0 - 0.7 K/uL   Basophils Absolute 0.0 0.0 - 0.1 K/uL  POCT urine pregnancy  Result Value Ref Range   Preg Test, Ur Negative Negative    Assessment & Plan:  This visit occurred during the SARS-CoV-2 public health emergency.  Safety protocols were in place, including screening questions prior to the visit, additional usage of staff PPE, and extensive cleaning of exam room while observing appropriate contact time as indicated for disinfecting solutions.   Problem List Items Addressed This Visit    Migraine without aura    H/o migraines followed by headache center on effexor and topamax preventatively with imitrex preventatively.  Recent persistent headache over the last few weeks sounds different from her typical migraines. Latest new med is myrbetriq which can be associated with headache, however she started this several months ago.  Discussed dehydration relation to headaches. Encouraged increased water intake daily.       Relevant Medications   metoprolol tartrate (LOPRESSOR) 25 MG tablet   venlafaxine XR (EFFEXOR-XR) 75 MG 24 hr  capsule   Other Relevant Orders   Ambulatory referral to Psychiatry   MDD (major depressive disorder), recurrent episode, moderate ([place])    On effexor 225mg  daily, started for migraine prevention and depression/anxiety. Has tried tapering off, poor result. Interested in cross taper onto another antidepressant, requests psych referral for this - will refer. Does not need psychology eval. Mood overall doing well.       Relevant Medications   venlafaxine XR (EFFEXOR-XR) 75 MG 24 hr capsule   Other Relevant Orders   Ambulatory referral to Psychiatry   Hypothyroidism    Continues brand Synthroid, no missed doses.       Relevant Medications   metoprolol tartrate (LOPRESSOR) 25 MG tablet   Other Relevant Orders   TSH (Completed)   T4, free (Completed)   Fatigue - Primary     New over the past 3 weeks associated with HA and hot flashes. Early for perimenopause. On mirena but still gets regular periods. Will check thyroid as well as other labs to eval for reversible causes of fatigue.       Relevant Orders   Ferritin (Completed)   Vitamin B12 (Completed)   IBC panel (Completed)   VITAMIN D 25 Hydroxy (Vit-D Deficiency, Fractures) (Completed)   Sedimentation rate (Completed)   Comprehensive metabolic panel (Completed)   TSH (Completed)   CBC with Differential/Platelet (Completed)   Anemia    H/o this. Update labs.       AMENORRHEA    Latest period very light ?if true period, currently due for rpt period - check Upreg.       Relevant Orders   POCT urine pregnancy (Completed)       Meds ordered this encounter  Medications  . metoprolol tartrate (LOPRESSOR) 25 MG tablet    Sig: Take 1 tablet (25 mg total) by mouth every morning.    Dispense:  90 tablet    Refill:  0   Orders Placed This Encounter  Procedures  . Ferritin  . Vitamin B12  . IBC panel  . VITAMIN D 25 Hydroxy (Vit-D Deficiency, Fractures)  . Sedimentation rate  . Comprehensive metabolic panel  . TSH  . T4, free  . CBC with Differential/Platelet  . Ambulatory referral to Psychiatry    Referral Priority:   Routine    Referral Type:   Psychiatric    Referral Reason:   Specialty Services Required    Requested Specialty:   Psychiatry    Number of Visits Requested:   1  . POCT urine pregnancy    Patient Instructions  Upreg today Labs today Increase water intake - dehydration likely playing a part in headaches.  Follow up plan: Return if symptoms worsen or fail to improve.  Ria Bush, MD

## 2019-06-24 NOTE — Patient Instructions (Addendum)
Upreg today Labs today Increase water intake - dehydration likely playing a part in headaches.

## 2019-06-25 ENCOUNTER — Encounter: Payer: Self-pay | Admitting: Family Medicine

## 2019-06-25 ENCOUNTER — Other Ambulatory Visit: Payer: Self-pay | Admitting: Family Medicine

## 2019-06-25 DIAGNOSIS — R739 Hyperglycemia, unspecified: Secondary | ICD-10-CM

## 2019-06-25 MED ORDER — VITAMIN B 12 500 MCG PO TABS: 500.0000 ug | ORAL_TABLET | Freq: Every day | ORAL | Status: AC

## 2019-06-25 NOTE — Assessment & Plan Note (Addendum)
H/o migraines followed by headache center on effexor and topamax preventatively with imitrex preventatively.  Recent persistent headache over the last few weeks sounds different from her typical migraines. Latest new med is myrbetriq which can be associated with headache, however she started this several months ago.  Discussed dehydration relation to headaches. Encouraged increased water intake daily.

## 2019-06-25 NOTE — Assessment & Plan Note (Signed)
New over the past 3 weeks associated with HA and hot flashes. Early for perimenopause. On mirena but still gets regular periods. Will check thyroid as well as other labs to eval for reversible causes of fatigue.

## 2019-06-25 NOTE — Assessment & Plan Note (Addendum)
On effexor 225mg  daily, started for migraine prevention and depression/anxiety. Has tried tapering off, poor result. Interested in cross taper onto another antidepressant, requests psych referral for this - will refer. Does not need psychology eval. Mood overall doing well.

## 2019-06-25 NOTE — Assessment & Plan Note (Signed)
H/o this. Update labs.

## 2019-06-25 NOTE — Assessment & Plan Note (Addendum)
Latest period very light ?if true period, currently due for rpt period - check Upreg.

## 2019-06-25 NOTE — Assessment & Plan Note (Signed)
Continues brand Synthroid, no missed doses.

## 2019-08-22 ENCOUNTER — Other Ambulatory Visit: Payer: Self-pay | Admitting: Family Medicine

## 2019-09-03 ENCOUNTER — Encounter: Payer: Self-pay | Admitting: Radiology

## 2019-09-19 ENCOUNTER — Other Ambulatory Visit: Payer: Self-pay | Admitting: Family Medicine

## 2019-09-26 IMAGING — MG MM DIGITAL SCREENING BILAT W/ CAD
6 series · 6 of 6 positions shown · non-contrast
Comparison: None.

CLINICAL DATA: Screening.

EXAM:
DIGITAL SCREENING BILATERAL MAMMOGRAM WITH CAD

[L CC]
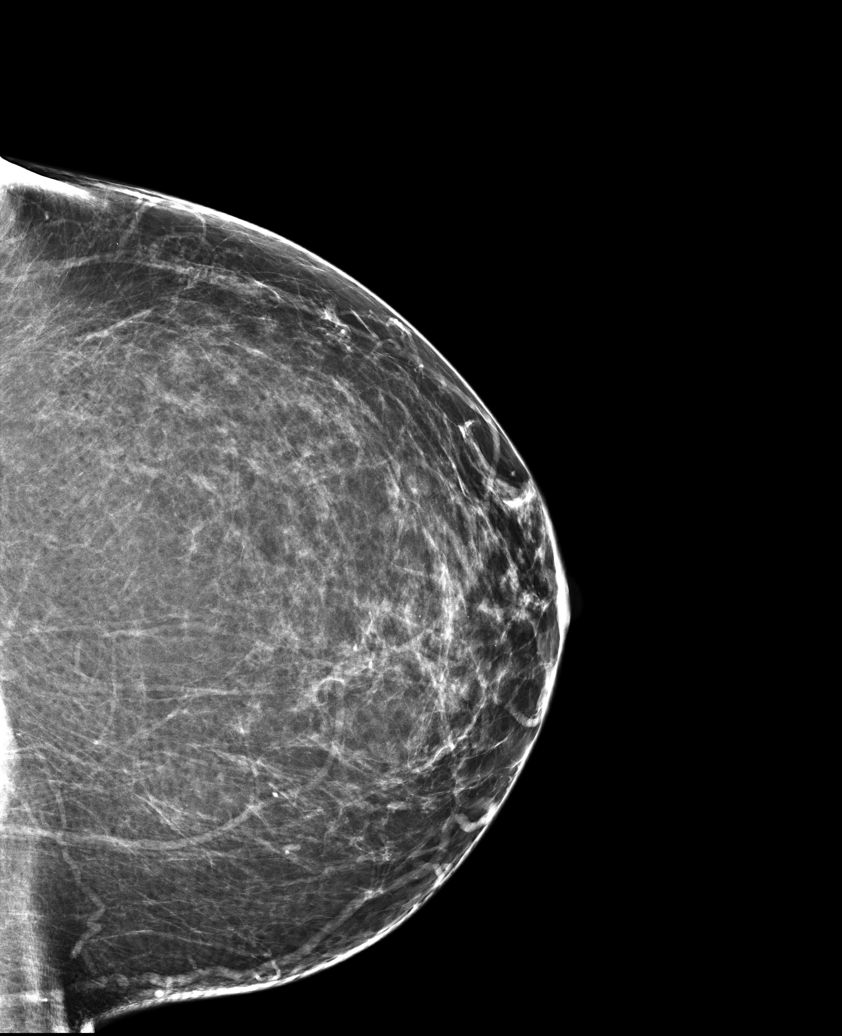

[R MLO (1 of 2)]
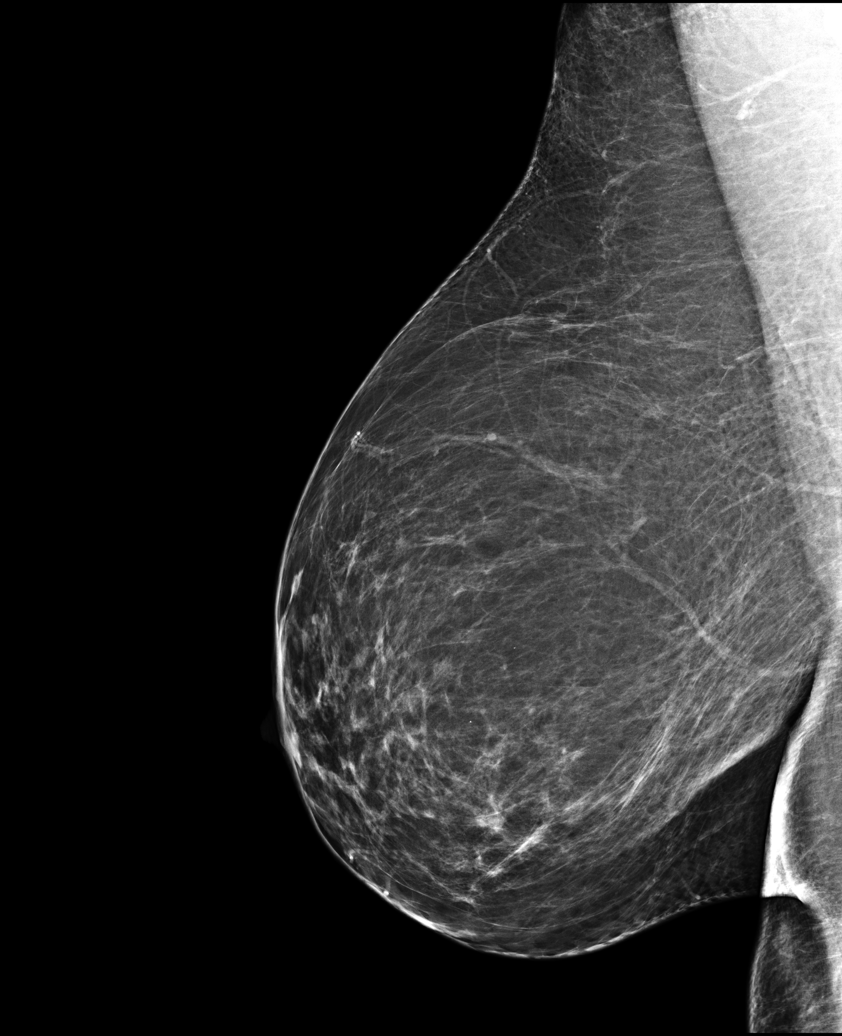

[R CC]
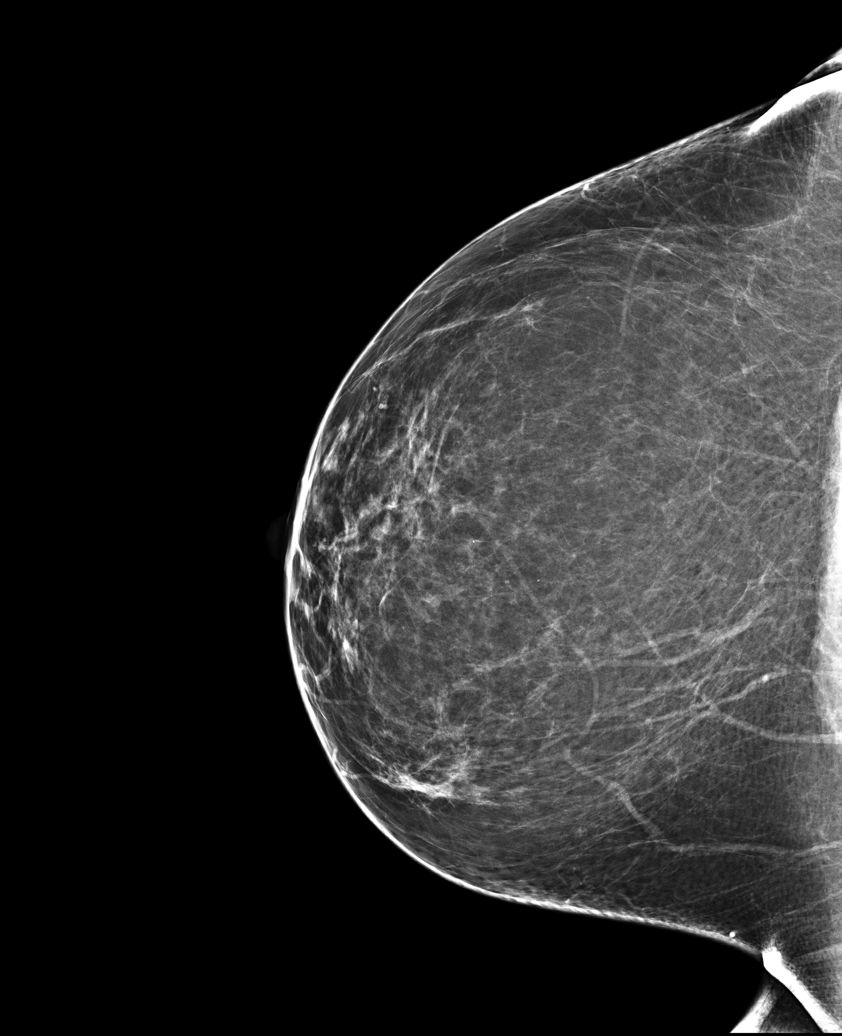

[L MLO (1 of 2)]
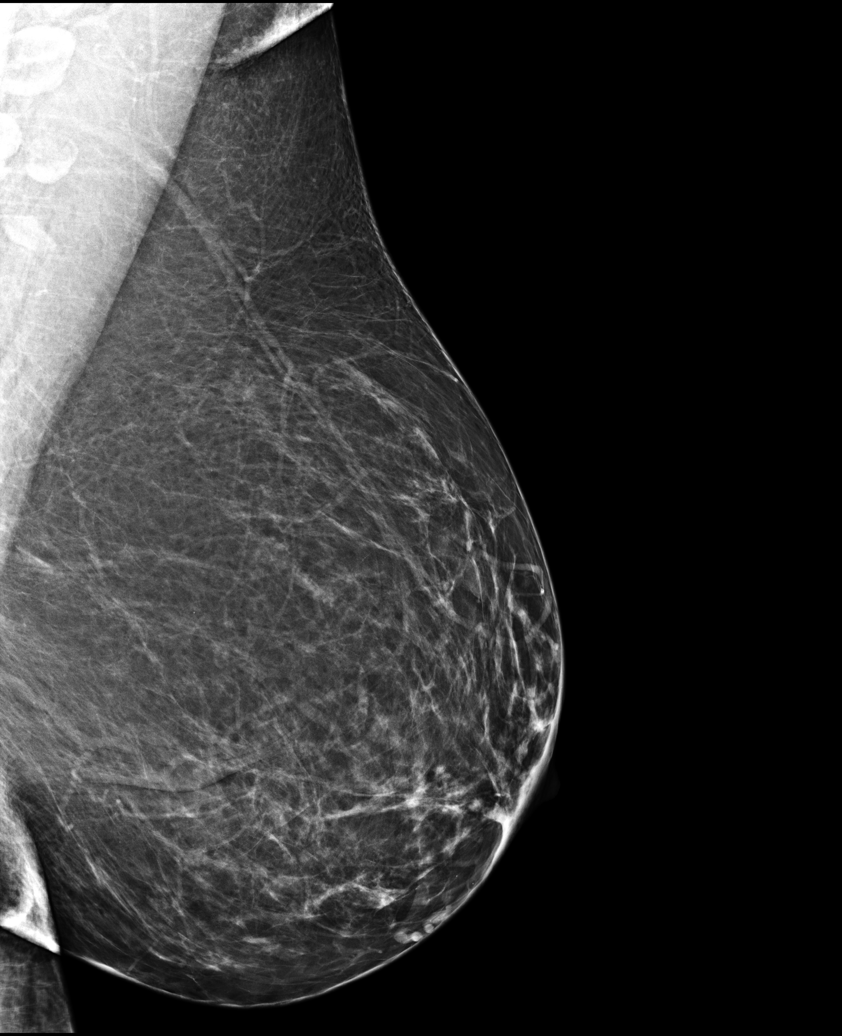

[L MLO (2 of 2)]
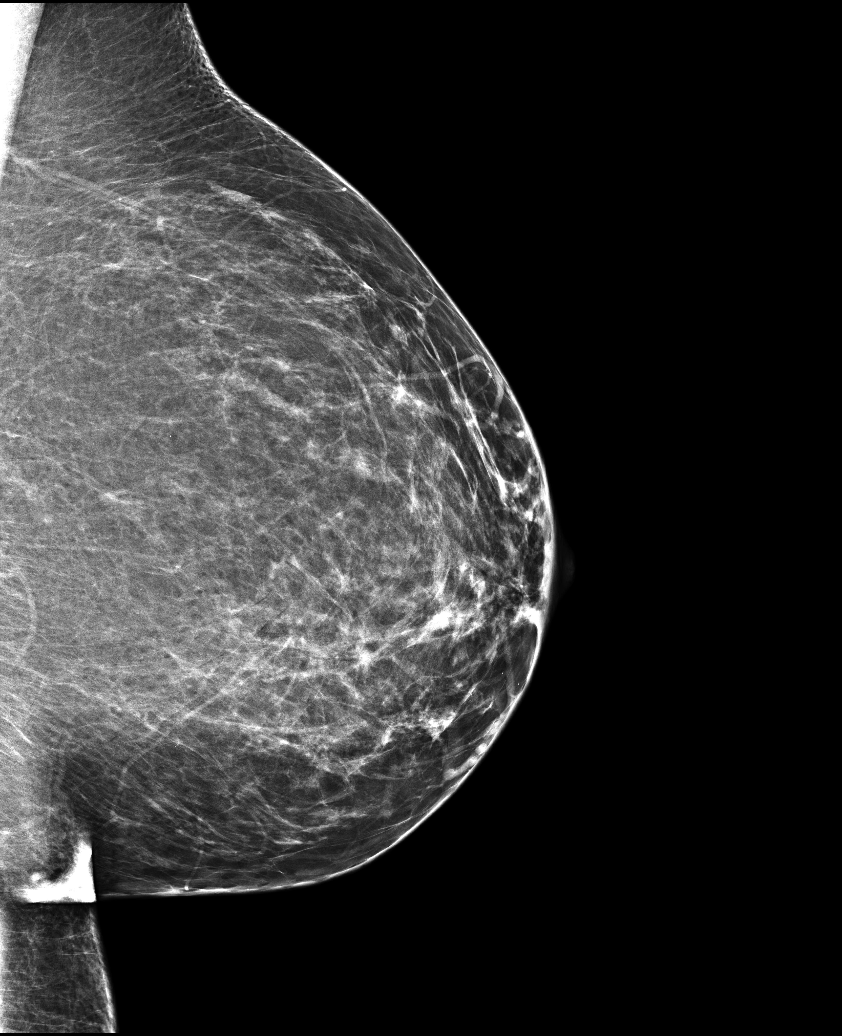

[R MLO (2 of 2)]
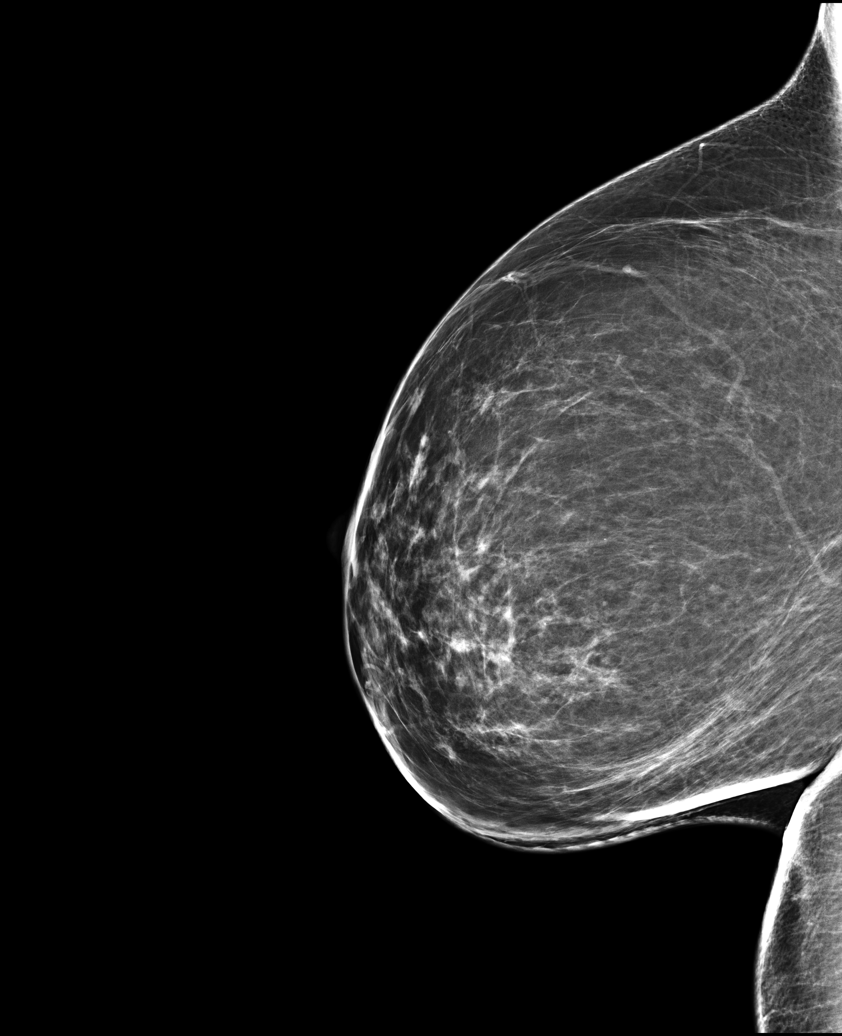

[6 of 6 positions shown; findings below may reference images not displayed]

ACR Breast Density Category b: There are scattered areas of
fibroglandular density.
FINDINGS: There are no findings suspicious for malignancy. Images were
processed with CAD.
IMPRESSION: No mammographic evidence of malignancy. A result letter of this
screening mammogram will be mailed directly to the patient.

RECOMMENDATION:
Screening mammogram in one year. (Code:[id number])

BI-RADS CATEGORY  1: Negative.

## 2019-10-24 ENCOUNTER — Other Ambulatory Visit: Payer: Self-pay | Admitting: Physician Assistant

## 2019-10-24 DIAGNOSIS — F329 Major depressive disorder, single episode, unspecified: Secondary | ICD-10-CM

## 2019-10-28 ENCOUNTER — Other Ambulatory Visit: Payer: Self-pay

## 2019-10-28 NOTE — Telephone Encounter (Signed)
Patient is requesting a refill on Effexor-XR

## 2019-10-29 ENCOUNTER — Other Ambulatory Visit: Payer: Self-pay | Admitting: Family Medicine

## 2019-10-29 ENCOUNTER — Other Ambulatory Visit: Payer: Self-pay | Admitting: Physician Assistant

## 2019-10-29 ENCOUNTER — Other Ambulatory Visit: Payer: Self-pay

## 2019-10-29 DIAGNOSIS — G43911 Migraine, unspecified, intractable, with status migrainosus: Secondary | ICD-10-CM

## 2019-10-29 MED ORDER — VENLAFAXINE HCL ER 75 MG PO CP24: 225.0000 mg | ORAL_CAPSULE | Freq: Every day | ORAL | 0 refills | Status: DC

## 2019-10-29 NOTE — Telephone Encounter (Signed)
Longstanding on this medication for migraines and depression/anxiety.  Last visit she mentioned she was considering tapering off effexor - we referred to psychiatry for this - did she go?  I have refilled but plz have her schedule OV to review ongoing prescription.

## 2019-10-29 NOTE — Telephone Encounter (Signed)
Pt called requesting that you take over filling her Effexor Rx... pt states she reached out to GYN for refill was advised her provider is no longer with the practice and she would need to reach out to PCP for refills.... Pt states this Rx will need a prior auth as well... pt is aware that if PA is required and Dr Reece Agar fills Rx, it will be a couple of days to complete PA and receive approval... pt is out of medication... pt expressed understanding

## 2019-10-30 NOTE — Telephone Encounter (Signed)
Spoke with pt relaying Dr. Timoteo Expose message.  Pt verbalizes understanding.  States she had 3 Telehealth visits with psych.  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  Office had r/s her appt without notice and she could not be seen.  Pt expresses her thanks for the refill.  Schedule OV for med review on 11/21/19 at 3:30.  FYI to Dr. Reece Agar.

## 2019-11-21 ENCOUNTER — Encounter: Payer: Self-pay | Admitting: Family Medicine

## 2019-11-21 ENCOUNTER — Ambulatory Visit (INDEPENDENT_AMBULATORY_CARE_PROVIDER_SITE_OTHER): Payer: BC Managed Care – PPO | Admitting: Family Medicine

## 2019-11-21 ENCOUNTER — Other Ambulatory Visit: Payer: Self-pay

## 2019-11-21 VITALS — BP 122/82 | HR 89 | Temp 98.1°F | Ht 63.0 in | Wt 233.2 lb

## 2019-11-21 DIAGNOSIS — F331 Major depressive disorder, recurrent, moderate: Secondary | ICD-10-CM | POA: Diagnosis not present

## 2019-11-21 DIAGNOSIS — F411 Generalized anxiety disorder: Secondary | ICD-10-CM

## 2019-11-21 MED ORDER — METOPROLOL TARTRATE 25 MG PO TABS: 25.0000 mg | ORAL_TABLET | Freq: Every day | ORAL | 1 refills | Status: DC

## 2019-11-21 MED ORDER — VENLAFAXINE HCL ER 75 MG PO CP24
225.0000 mg | ORAL_CAPSULE | Freq: Every day | ORAL | 1 refills | Status: DC
Start: 2019-11-21 — End: 2020-06-10

## 2019-11-21 NOTE — Assessment & Plan Note (Signed)
Longstanding period on high dose Effexor XR 225mg  daily, has had difficulty tapering off in the past, aware of difficulty with venlafaxine taper in general. Desires to continue current regimen which is reasonable at this time. Discussed possible re-attempt in the future during less stressful time (ie during christmas or summer break). Will continue current regimen for now.

## 2019-11-21 NOTE — Patient Instructions (Signed)
Good to see you today No changes at this time.

## 2019-11-21 NOTE — Assessment & Plan Note (Signed)
See below

## 2019-11-21 NOTE — Progress Notes (Signed)
This visit was conducted in person.  BP 122/82 (BP Location: Left Arm, Patient Position: Sitting, Cuff Size: Large)   Pulse 89   Temp 98.1 F (36.7 C) (Temporal)   Ht 5\' 3"  (1.6 m)   Wt 233 lb 4 oz (105.8 kg)   SpO2 98%   BMI 41.32 kg/m    CC: discuss depression Subjective:    Patient ID: , female    DOB: 08-06-1975, 44 y.o.   MRN: [id number]  HPI: Tamara Shaffer is a 44 y.o. female presenting on 11/21/2019 for Depression (Here for f/u.  Wants to discuss Effexor. )   Requests our office start filling Effexor. Previously prescribed by GYN but provider left practice. Initially started for migraine prevention but continued to help anxiety > depression. On this dose for years. Poor result when she's tried to taper off. Last visit (06/2019) we referred to psychiatry to help with tapering. 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   Overall stable on current regimen - and with increased anxiety with school covid and family illnesses, doesn't think now is a good time to make any changes.  Previously did well with zoloft and would ultimately like to transition to this in the future.   Also on topamax for migraine prevention.   COVID vaccine - worried about getting vaccine due to high side effects when she gets flu shot.      Relevant past medical, surgical, family and social history reviewed and updated as indicated. Interim medical history since our last visit reviewed. Allergies and medications reviewed and updated. Outpatient Medications Prior to Visit  Medication Sig Dispense Refill  . Ascorbic Acid (VITAMIN C PO) Take by mouth daily.    . Cholecalciferol (VITAMIN D) 2000 units CAPS Take 1 capsule (2,000 Units total) by mouth daily. 30 capsule   . cyanocobalamin 500 MCG TABS Take 500 mcg by mouth daily.    [place] ELDERBERRY PO Take by mouth daily.    .  hydrochlorothiazide (HYDRODIURIL) 25 MG tablet TAKE 1 TABLET (25 MG TOTAL) BY MOUTH DAILY AS NEEDED (LEG SWELLING). 90 tablet 1  . levonorgestrel (MIRENA) 20 MCG/24HR IUD 1 each by Intrauterine route once.    . loratadine (CLARITIN) 10 MG tablet Take 10 mg by mouth daily.    . mirabegron ER (MYRBETRIQ) 50 MG TB24 tablet Take 1 tablet (50 mg total) by mouth daily. 30 tablet 11  . Multiple Vitamin (MULTIVITAMIN) tablet Take 1 tablet by mouth daily.      . Multiple Vitamins-Minerals (ZINC PO) Take by mouth daily.    Marland Kitchen omeprazole (PRILOSEC) 20 MG capsule TAKE 1 CAPSULE BY MOUTH EVERY DAY 90 capsule 3  . SUMAtriptan (IMITREX) 100 MG tablet TAKE 1 TABLET BY MOUTH ONCE AS NEEDED FOR MIGRAINE 27 tablet 4  . SYNTHROID 125 MCG tablet TAKE 1 TABLET BY MOUTH EVERY DAY BEFORE BREAKFAST 90 tablet 0  . topiramate (TOPAMAX) 100 MG tablet Take 1 tablet (100 mg total) by mouth at bedtime. 30 tablet 1  . TURMERIC CURCUMIN PO Take by mouth daily.    . metoprolol tartrate (LOPRESSOR) 25 MG tablet TAKE 1 TABLET BY MOUTH EVERY DAY IN THE MORNING 90 tablet 0  . venlafaxine XR (EFFEXOR-XR) 75 MG 24 hr capsule Take 3 capsules (225 mg total) by mouth daily with breakfast. 270 capsule 0   Facility-Administered Medications Prior to Visit  Medication Dose  Route Frequency Provider Last Rate Last Admin  . diclofenac (VOLTAREN) EC tablet 75 mg  75 mg Oral BID Reva Bores, MD         Per HPI unless specifically indicated in ROS section below Review of Systems Objective:  BP 122/82 (BP Location: Left Arm, Patient Position: Sitting, Cuff Size: Large)   Pulse 89   Temp 98.1 F (36.7 C) (Temporal)   Ht 5\' 3"  (1.6 m)   Wt 233 lb 4 oz (105.8 kg)   SpO2 98%   BMI 41.32 kg/m   Wt Readings from Last 3 Encounters:  11/21/19 233 lb 4 oz (105.8 kg)  06/24/19 245 lb (111.1 kg)  04/21/19 237 lb (107.5 kg)      Physical Exam Vitals and nursing note reviewed.  Constitutional:      Appearance: Normal appearance. She is  obese. She is not ill-appearing.  Neurological:     Mental Status: She is alert.  Psychiatric:        Mood and Affect: Mood normal.        Behavior: Behavior normal.        Thought Content: Thought content normal.        Judgment: Judgment normal.        Depression screen [facility] 2/9 11/21/2019 09/27/2018  Decreased Interest 0 1  Down, Depressed, Hopeless 1 1  PHQ - 2 Score 1 2  Altered sleeping 2 1  Tired, decreased energy 3 1  Change in appetite 2 0  Feeling bad or failure about yourself  2 0  Trouble concentrating 1 0  Moving slowly or fidgety/restless 0 0  Suicidal thoughts 0 0  PHQ-9 Score 11 4    GAD 7 : Generalized Anxiety Score 11/21/2019 09/27/2018  Nervous, Anxious, on Edge 2 1  Control/stop worrying 2 0  Worry too much - different things 3 3  Trouble relaxing 3 0  Restless 2 0  Easily annoyed or irritable 3 1  Afraid - awful might happen 1 0  Total GAD 7 Score 16 5   Assessment & Plan:  This visit occurred during the SARS-CoV-2 public health emergency.  Safety protocols were in place, including screening questions prior to the visit, additional usage of staff PPE, and extensive cleaning of exam room while observing appropriate contact time as indicated for disinfecting solutions.  Declines flu shot today. Discussed COVID vaccine.  Problem List Items Addressed This Visit    MDD (major depressive disorder), recurrent episode, moderate (HCC)    See below.       Relevant Medications   venlafaxine XR (EFFEXOR-XR) 75 MG 24 hr capsule   GAD (generalized anxiety disorder) - Primary    Longstanding period on high dose Effexor XR 225mg  daily, has had difficulty tapering off in the past, aware of difficulty with venlafaxine taper in general. Desires to continue current regimen which is reasonable at this time. Discussed possible re-attempt in the future during less stressful time (ie during christmas or summer break). Will continue current regimen for now.       Relevant  Medications   venlafaxine XR (EFFEXOR-XR) 75 MG 24 hr capsule       Meds ordered this encounter  Medications  . metoprolol tartrate (LOPRESSOR) 25 MG tablet    Sig: Take 1 tablet (25 mg total) by mouth daily.    Dispense:  90 tablet    Refill:  1  . venlafaxine XR (EFFEXOR-XR) 75 MG 24 hr capsule    Sig: Take  3 capsules (225 mg total) by mouth daily with breakfast.    Dispense:  270 capsule    Refill:  1   No orders of the defined types were placed in this encounter.   Patient Instructions  Good to see you today No changes at this time.    Follow up plan: No follow-ups on file.  Eustaquio Boyden, MD

## 2019-11-25 ENCOUNTER — Ambulatory Visit
Admission: EM | Admit: 2019-11-25 | Discharge: 2019-11-25 | Disposition: A | Discharge status: Home / Self Care | Payer: BC Managed Care – PPO | Attending: Emergency Medicine | Admitting: Emergency Medicine

## 2019-11-25 ENCOUNTER — Encounter: Payer: Self-pay | Admitting: Emergency Medicine

## 2019-11-25 ENCOUNTER — Other Ambulatory Visit: Payer: Self-pay

## 2019-11-25 DIAGNOSIS — Z20822 Contact with and (suspected) exposure to covid-19: Secondary | ICD-10-CM

## 2019-11-25 DIAGNOSIS — Z1152 Encounter for screening for COVID-19: Secondary | ICD-10-CM

## 2019-11-25 DIAGNOSIS — R5381 Other malaise: Secondary | ICD-10-CM

## 2019-11-25 DIAGNOSIS — R5383 Other fatigue: Secondary | ICD-10-CM

## 2019-11-25 NOTE — Discharge Instructions (Addendum)
Your COVID test is pending - it is important to quarantine / isolate at home until your results are back. °If you test positive and would like further evaluation for persistent or worsening symptoms, you may schedule an E-visit or virtual (video) visit throughout the [facility] My[facility] app or website. ° °PLEASE NOTE: If you develop severe chest pain or shortness of breath please go to the ER or call 9-1-1 for further evaluation --> DO NOT schedule electronic or virtual visits for this. °Please call our office for further guidance / recommendations as needed. ° °For information about the Covid vaccine, please visit [email or web address] °

## 2019-11-25 NOTE — ED Provider Notes (Signed)
[facility]    CSN: [id number] Arrival date & time: 11/25/19  0856      History   Chief Complaint Chief Complaint  Patient presents with  . Fever  . Generalized Body Aches    HPI Tamara Shaffer is a 44 y.o. female  Presenting for Covid testing: Exposure: pupil who's cohabitating family member is positive Date of exposure: M-F x 2 weeks Any fever, symptoms since exposure: Yes-fever (Tmax 100.17F, alleviated by APAP), malaise, fatigue, dry cough since Wednesday.  Underwent Covid testing same day: Negative.  Feels symptoms have worsened since this weekend.  No chest pain, shortness of breath, vomiting or diarrhea.    Past Medical History:  Diagnosis Date  . Allergic rhinitis    seasonal  . Anxiety   . Depression   . GDM (gestational diabetes mellitus)   . GERD (gastroesophageal reflux disease)   . Hypothyroidism    medically treated, likely initially thyroiditis (heterogeneous gland on [place] 05/2015)  . Insomnia   . 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 Obesity   . Prediabetes    A1c 5.8% 11/2010  . Urinary incontinence   . Vulvar burning     Patient Active Problem List   Diagnosis Date Noted  . Leukocytosis 09/28/2018  . Hot flashes 07/04/2017  . OSA on CPAP 04/07/2016  . Menorrhagia 01/28/2016  . Right shoulder pain 05/19/2015  . GAD (generalized anxiety disorder) 02/24/2014  . Migraine without aura 12/10/2012  . Severe obesity (BMI >= 40) (HCC) 08/06/2012  . Sacroiliitis (HCC) 10/18/2011  . Anemia 09/02/2010  . Fatigue 07/12/2010  . ALLERGIC RHINITIS CAUSE UNSPECIFIED 05/27/2010  . AMENORRHEA 05/19/2010  . Hypothyroidism 04/14/2010  . STRESS INCONTINENCE 04/14/2010  . GERD (gastroesophageal reflux disease) 10/16/2007  . MDD (major depressive disorder), recurrent episode, moderate (HCC) 06/05/2007    Past Surgical History:  Procedure Laterality Date  . BREAST SURGERY  06/1994    Reconstruction  . CHOLECYSTECTOMY    . ECTOPIC PREGNANCY SURGERY  10/2008   Tube remained  . REDUCTION MAMMAPLASTY Left 1996  . thyroid US  06/2010   heterogeneous normal sized thyroid gland consistent with chronic thyroiditis    OB History    Gravida  2   Para  2   Term      Preterm      AB      Living  2     SAB      TAB      Ectopic      Multiple      Live Births  2            Home Medications    Prior to Admission medications   Medication Sig Start Date End Date Taking? Authorizing Provider  Ascorbic Acid (VITAMIN C PO) Take by mouth daily.    [provider]  Cholecalciferol (VITAMIN D) 2000 units CAPS Take 1 capsule (2,000 Units total) by mouth daily. 07/07/17   Eustaquio Boyden, MD  cyanocobalamin 500 MCG TABS Take 500 mcg by mouth daily. 06/25/19   Eustaquio Boyden, MD  ELDERBERRY PO Take by mouth daily.    [provider]  hydrochlorothiazide (HYDRODIURIL) 25 MG tablet TAKE 1 TABLET (25 MG TOTAL) BY MOUTH DAILY AS NEEDED (LEG SWELLING). 08/22/19   Eustaquio Boyden, MD  levonorgestrel (MIRENA) 20 MCG/24HR IUD 1 each by Intrauterine route once.    [provider]  loratadine (CLARITIN) 10 MG tablet  Take 10 mg by mouth daily.    [provider]  metoprolol tartrate (LOPRESSOR) 25 MG tablet Take 1 tablet (25 mg total) by mouth daily. 11/21/19   Eustaquio Boyden, MD  mirabegron ER (MYRBETRIQ) 50 MG TB24 tablet Take 1 tablet (50 mg total) by mouth daily. 04/21/19   Alfredo Martinez, MD  Multiple Vitamin (MULTIVITAMIN) tablet Take 1 tablet by mouth daily.      [provider]  Multiple Vitamins-Minerals (ZINC PO) Take by mouth daily.    [provider]  omeprazole (PRILOSEC) 20 MG capsule TAKE 1 CAPSULE BY MOUTH EVERY DAY 02/20/19   Glyn Ade, Scot Jun, PA-C  SUMAtriptan (IMITREX) 100 MG tablet TAKE 1 TABLET BY MOUTH ONCE AS NEEDED FOR MIGRAINE 10/31/19   Glyn Ade, Scot Jun, PA-C  SYNTHROID 125 MCG tablet  TAKE 1 TABLET BY MOUTH EVERY DAY BEFORE BREAKFAST 10/29/19   Eustaquio Boyden, MD  topiramate (TOPAMAX) 100 MG tablet Take 1 tablet (100 mg total) by mouth at bedtime. 12/20/18   Teague Chestine Spore, Scot Jun, PA-C  TURMERIC CURCUMIN PO Take by mouth daily.    [provider]  venlafaxine XR (EFFEXOR-XR) 75 MG 24 hr capsule Take 3 capsules (225 mg total) by mouth daily with breakfast. 11/21/19   Eustaquio Boyden, MD    Family History Family History  Problem Relation Age of Onset  . Cancer Mother        leiomyosarcoma of abdomen  . Hypertension Father   . Heart disease Father        Porcine Valve Replacement  . Cancer Maternal Grandmother        Ovarian cancer  . Cancer Cousin        Thyroid cancer  . Diabetes Neg Hx   . Stroke Neg Hx     Social History Social History   Tobacco Use  . Smoking status: Never Smoker  . Smokeless tobacco: Never Used  Vaping Use  . Vaping Use: Never used  Substance Use Topics  . Alcohol use: Not Currently    Comment: Socially  . Drug use: No     Allergies   Erythromycin and Penicillins   Review of Systems As per HPI   Physical Exam Triage Vital Signs ED Triage Vitals  Enc Vitals Group     BP 11/25/19 0910 130/88     Pulse Rate 11/25/19 0910 74     Resp 11/25/19 0910 18     Temp 11/25/19 0910 98.6 F (37 C)     Temp Source 11/25/19 0910 Oral     SpO2 11/25/19 0910 98 %     Weight --      Height --      Head Circumference --      Peak Flow --      Pain Score 11/25/19 0914 5     Pain Loc --      Pain Edu? --      Excl. in GC? --    No data found.  Updated Vital Signs BP 130/88 (BP Location: Left Arm)   Pulse 74   Temp 98.6 F (37 C) (Oral)   Resp 18   SpO2 98%   Visual Acuity Right Eye Distance:   Left Eye Distance:   Bilateral Distance:    Right Eye Near:   Left Eye Near:    Bilateral Near:     Physical Exam Constitutional:      General: She is not in acute distress.    Appearance: She  is obese. She is not  ill-appearing or diaphoretic.  HENT:     Head: Normocephalic and atraumatic.     Right Ear: Tympanic membrane and ear canal normal.     Left Ear: Tympanic membrane and ear canal normal.     Mouth/Throat:     Mouth: Mucous membranes are moist.     Pharynx: Oropharynx is clear. No oropharyngeal exudate or posterior oropharyngeal erythema.  Eyes:     General: No scleral icterus.    Conjunctiva/sclera: Conjunctivae normal.     Pupils: Pupils are equal, round, and reactive to light.  Neck:     Comments: Trachea midline, negative JVD Cardiovascular:     Rate and Rhythm: Normal rate and regular rhythm.     Heart sounds: No murmur heard.  No gallop.   Pulmonary:     Effort: Pulmonary effort is normal. No respiratory distress.     Breath sounds: No wheezing, rhonchi or rales.  Musculoskeletal:     Cervical back: Neck supple. No tenderness.  Lymphadenopathy:     Cervical: No cervical adenopathy.  Skin:    Capillary Refill: Capillary refill takes less than 2 seconds.     Coloration: Skin is not jaundiced or pale.     Findings: No rash.  Neurological:     General: No focal deficit present.     Mental Status: She is alert and oriented to person, place, and time.      UC Treatments / Results  Labs (all labs ordered are listed, but only abnormal results are displayed) Labs Reviewed  NOVEL CORONAVIRUS, NAA    EKG   Radiology No results found.  Procedures Procedures (including critical care time)  Medications Ordered in UC Medications - No data to display  Initial Impression / Assessment and Plan / UC Course  I have reviewed the triage vital signs and the nursing notes.  Pertinent labs & imaging results that were available during my care of the patient were reviewed by me and considered in my medical decision making (see chart for details).     Patient afebrile, nontoxic, with SpO2 98%.  Covid PCR pending.  Patient to quarantine until results are back.  We will treat  supportively as outlined below.  Return precautions discussed, patient verbalized understanding and is agreeable to plan. Final Clinical Impressions(s) / UC Diagnoses   Final diagnoses:  Encounter for screening for COVID-19  Malaise and fatigue  Exposure to COVID-19 virus     Discharge Instructions     Your COVID test is pending - it is important to quarantine / isolate at home until your results are back. If you test positive and would like further evaluation for persistent or worsening symptoms, you may schedule an E-visit or virtual (video) visit throughout the [facility] app or website.  PLEASE NOTE: If you develop severe chest pain or shortness of breath please go to the ER or call 9-1-1 for further evaluation --> DO NOT schedule electronic or virtual visits for this. Please call our office for further guidance / recommendations as needed.  For information about the Covid vaccine, please visit [email or web address]    ED Prescriptions    None     PDMP not reviewed this encounter.   [place], [place], [place] 11/25/19 1031

## 2019-11-25 NOTE — ED Triage Notes (Signed)
Pt here for fever and body aches with chills x 3 days; pt had negative covid test 9/3

## 2019-11-27 ENCOUNTER — Ambulatory Visit: Payer: BC Managed Care – PPO | Admitting: Family Medicine

## 2019-11-27 LAB — NOVEL CORONAVIRUS, NAA: SARS-CoV-2, NAA: DETECTED — AB

## 2019-11-27 LAB — SARS-COV-2, NAA 2 DAY TAT

## 2019-11-28 ENCOUNTER — Other Ambulatory Visit (HOSPITAL_COMMUNITY): Payer: Self-pay | Admitting: Family

## 2019-11-28 ENCOUNTER — Telehealth: Payer: Self-pay

## 2019-11-28 NOTE — Telephone Encounter (Signed)
Noted. Thank you.  plz place on covid call list and check on her Monday, would offer virtual visit if desired next week.

## 2019-11-28 NOTE — Telephone Encounter (Signed)
I spoke with pt; pt was seen with Dr Reece Agar on 11/21/19 with neg covid test. On 11/25/19 pt seen at [facility] [address]; at that time pt had fever 100.3 that was relieved with APAP, malaise, fatigue and dry cough; on 11/25/19 pt said tested + for covid. Pt wants to know what are next steps; pt has had fever and weakness all week. I spoke with pt; pts main concern is continuing with fever (now temp 99) chills, muscle pain or all over body aches, H/A 6-7 pain level, generalized weakness, prod cough with clear phlegm but cough has gotten much deeper; no wheezing, CP or SOB; watery diarrhea for 3 days; Loss of taste or smell since 11/29/19. Pt has runny nose and fatigue. Pt was exposed to a student with + covid on 11/18/19 and both parties were wearing a mask. Pt has not had covid vaccine. I spoke with Dr Reece Agar and he wanted to know if pt could ck pulse ox; pt said no. Pt is not having difficulty breathing, SOB or CP and lips and fingers are not blue or purple. DR G said ED precautions and the ED precautions were given to pt and pt voiced understanding. Dr Reece Agar also said to report to infusion team for setting up appt of infusion; Lisa CMA notified infusion team and pt is aware will get cb from infusion team to schedule appt for infusion.FYI to Dr Reece Agar.

## 2019-11-28 NOTE — Telephone Encounter (Signed)
Noted  

## 2019-11-28 NOTE — Progress Notes (Signed)
I connected by phone with Tamara Shaffer on 11/28/2019 at 2:22 PM to discuss the potential use of a new treatment for mild to moderate COVID-19 viral infection in non-hospitalized patients.  This patient is a 44 y.o. female that meets the FDA criteria for Emergency Use Authorization of COVID monoclonal antibody casirivimab/imdevimab.  Has a (+) direct SARS-CoV-2 viral test result  Has mild or moderate COVID-19   Is NOT hospitalized due to COVID-19  Is within 10 days of symptom onset  Has at least one of the high risk factor(s) for progression to severe COVID-19 and/or hospitalization as defined in EUA.  Specific high risk criteria : BMI > 25   Patient advises she had a headache and nausea on 9/3 which is typical of her known migraine headaches. 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 She tested negative. She felt fine for the next 2 days with zero symptoms. 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 On 9/6 she developed cough, body aches, and fever. She felt those symptoms were covid like and saw her doctor of her own accord and tested positive. Given her known history of migraines and high risk for complications, patient scheduled for infusion.    I have spoken and communicated the following to the patient or parent/caregiver regarding COVID monoclonal antibody treatment:  1. FDA has authorized the emergency use for the treatment of mild to moderate COVID-19 in adults and pediatric patients with positive results of direct SARS-CoV-2 viral testing who are 59 years of age and older weighing at least 40 kg, and who are at high risk for progressing to severe COVID-19 and/or hospitalization.  2. The significant known and potential risks and benefits of COVID monoclonal antibody, and the extent to which such potential risks and benefits are unknown.  3. Information on available alternative treatments and the risks and benefits of those alternatives,  including clinical trials.  4. Patients treated with COVID monoclonal antibody should continue to self-isolate and use infection control measures (e.g., wear mask, isolate, social distance, avoid sharing personal items, clean and disinfect "high touch" surfaces, and frequent handwashing) according to CDC guidelines.   5. The patient or parent/caregiver has the option to accept or refuse COVID monoclonal antibody treatment.  After reviewing this information with the patient, The patient agreed to proceed with receiving casirivimab\imdevimab infusion and will be provided a copy of the Fact sheet prior to receiving the infusion. [employer] 11/28/2019 2:22 PM

## 2019-11-29 ENCOUNTER — Ambulatory Visit (HOSPITAL_COMMUNITY)
Admission: RE | Admit: 2019-11-29 | Discharge: 2019-11-29 | Disposition: A | Discharge status: Home / Self Care | Payer: BC Managed Care – PPO | Source: Ambulatory Visit | Attending: Pulmonary Disease | Admitting: Pulmonary Disease

## 2019-11-29 DIAGNOSIS — U071 COVID-19: Secondary | ICD-10-CM | POA: Diagnosis present

## 2019-11-29 MED ORDER — ALBUTEROL SULFATE HFA 108 (90 BASE) MCG/ACT IN AERS: 2.0000 | INHALATION_SPRAY | Freq: Once | RESPIRATORY_TRACT | Status: DC | PRN

## 2019-11-29 MED ORDER — SODIUM CHLORIDE 0.9 % IV SOLN: INTRAVENOUS | Status: DC | PRN

## 2019-11-29 MED ORDER — METHYLPREDNISOLONE SODIUM SUCC 125 MG IJ SOLR: 125.0000 mg | Freq: Once | INTRAMUSCULAR | Status: DC | PRN

## 2019-11-29 MED ORDER — SODIUM CHLORIDE 0.9 % IV SOLN
1200.0000 mg | Freq: Once | INTRAVENOUS | Status: AC
  Administered 2019-11-29: 1200 mg via INTRAVENOUS
  Filled 2019-11-29: qty 10

## 2019-11-29 MED ORDER — DIPHENHYDRAMINE HCL 50 MG/ML IJ SOLN: 50.0000 mg | Freq: Once | INTRAMUSCULAR | Status: DC | PRN

## 2019-11-29 MED ORDER — FAMOTIDINE IN NACL 20-0.9 MG/50ML-% IV SOLN: 20.0000 mg | Freq: Once | INTRAVENOUS | Status: DC | PRN

## 2019-11-29 MED ORDER — EPINEPHRINE 0.3 MG/0.3ML IJ SOAJ: 0.3000 mg | Freq: Once | INTRAMUSCULAR | Status: DC | PRN

## 2019-11-29 NOTE — Discharge Instructions (Signed)

## 2019-11-29 NOTE — Progress Notes (Signed)
  Diagnosis: COVID-19  Physician: Dr. Wright  Procedure: Covid Infusion Clinic Med: casirivimab\imdevimab infusion - Provided patient with casirivimab\imdevimab fact sheet for patients, parents and caregivers prior to infusion.  Complications: No immediate complications noted.  Discharge: Discharged home   Brenna Friesenhahn R Joely Losier 11/29/2019   

## 2019-11-29 NOTE — Progress Notes (Signed)
  Diagnosis: COVID-19  Physician: Dr. Wright  Procedure: Covid Infusion Clinic Med: casirivimab\imdevimab infusion - Provided patient with casirivimab\imdevimab fact sheet for patients, parents and caregivers prior to infusion.  Complications: No immediate complications noted.  Discharge: Discharged home   Ninoska Goswick M Etheridge Geil 11/29/2019  

## 2019-12-01 NOTE — Telephone Encounter (Signed)
Glad she's doing better. I believe first day of symptoms was 11/25/2019. Then would be eligible to return to work on 12/06/2019 (Sat) assuming continues improving without any more fevers.  If doing well on Friday, we can write letter and send through mychart to return on Monday 12/08/2019.  plz touch base again on Wed.

## 2019-12-01 NOTE — Telephone Encounter (Signed)
Spoke with pt for update on sxs.  Says she feels a lot better.  Had infusion on 11/29/19 and it really helped.  Says has more energy, just a little fatigued and still cannot taste/smell.  But other than that she feels great.  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  Pt is asking what is the process or is there anything other CDC protocol to return to the workplace.  Plz advise.

## 2019-12-01 NOTE — Telephone Encounter (Signed)
Noted  

## 2019-12-03 NOTE — Telephone Encounter (Signed)
Noted thank you.  plz touch base again tomorrow. If doing well, could return to work on Friday after she completes 10 day home isolation.

## 2019-12-03 NOTE — Telephone Encounter (Signed)
Spoke with pt asking for an update.  States she is feeling much better.  No fever this week.  Still has some nasal congestion and cannot taste or smell.   But feels she is improving daily.    I relayed Dr. Timoteo Expose message about returning to work.  Pt states her sxs started 11/24/19.  She verbalizes understanding and says she will look for the letter in her MyChart.

## 2019-12-04 NOTE — Telephone Encounter (Signed)
Letter written and sent to patient.

## 2019-12-04 NOTE — Telephone Encounter (Signed)
Noted  

## 2019-12-04 NOTE — Telephone Encounter (Signed)
Spoke with pt asking for an update.  States she is feeling much better.  She was able to sleep about 12 hrs last night.  I relayed Dr. Timoteo Expose message.  Pt is requesting work note for her to return on Mon, 12/08/19, unless Dr. Reece Agar thinks she should go back tomorrow.

## 2020-02-22 ENCOUNTER — Other Ambulatory Visit: Payer: Self-pay | Admitting: Family Medicine

## 2020-02-22 ENCOUNTER — Other Ambulatory Visit: Payer: Self-pay | Admitting: Physician Assistant

## 2020-04-23 ENCOUNTER — Telehealth (INDEPENDENT_AMBULATORY_CARE_PROVIDER_SITE_OTHER): Payer: Self-pay | Admitting: Family Medicine

## 2020-04-23 DIAGNOSIS — R0981 Nasal congestion: Secondary | ICD-10-CM

## 2020-04-23 DIAGNOSIS — R059 Cough, unspecified: Secondary | ICD-10-CM

## 2020-04-23 MED ORDER — BENZONATATE 100 MG PO CAPS: 100.0000 mg | ORAL_CAPSULE | Freq: Three times a day (TID) | ORAL | 0 refills | Status: DC | PRN

## 2020-04-23 MED ORDER — ALBUTEROL SULFATE HFA 108 (90 BASE) MCG/ACT IN AERS: 2.0000 | INHALATION_SPRAY | Freq: Four times a day (QID) | RESPIRATORY_TRACT | 0 refills | Status: DC | PRN

## 2020-04-23 NOTE — Patient Instructions (Addendum)
   ---------------------------------------------------------------------------------------------------------------------------      WORK SLIP:  Patient Tamara Shaffer,  1975/08/17, was seen for a medical visit today, 04/23/20 . Please excuse from work for a COVID like illness. We advise 10 days minimum from the onset of symptoms (04/17/20) PLUS 1 day of no fever and improved symptoms. Will defer to employer for a sooner return to work if symptoms have resolved, it is greater than 5 days since the positive test and the patient can wear a high-quality, tight fitting mask such as N95 or KN95 at all times for an additional 5 days. Would also suggest COVID19 antigen testing is negative prior to return.  Sincerely: E-signature: Dr. Kriste Basque, DO [facility] Ph: [phone]   ------------------------------------------------------------------------------------------------------------------------------   HOME CARE TIPS:  Tamara Shaffer COVID19 testing information: [email or web address] [place] [phone] Most pharmacies also offer testing and home test kits. If covid test is positive please schedule prompt follow-up video visit through the [facility] website if you wish to discuss referral for possible outpatient treatment for Covid (these must be given within the first 5 to 7 days of symptoms.)  -I sent the medication(s) we discussed to your pharmacy: Meds ordered this encounter  Medications  . benzonatate (TESSALON PERLES) 100 MG capsule    Sig: Take 1 capsule (100 mg total) by mouth 3 (three) times daily as needed.    Dispense:  20 capsule    Refill:  0  . albuterol (PROAIR HFA) 108 (90 Base) MCG/ACT inhaler    Sig: Inhale 2 puffs into the lungs every 6 (six) hours as needed for wheezing or shortness of breath.    Dispense:  1 each    Refill:  0      -can use tylenol or aleve if needed for fevers, aches and pains per  instructions  -can use nasal saline a few times per day if you have nasal congestion  -stay hydrated, drink plenty of fluids and eat small healthy meals - avoid dairy  -can take 1000 IU ( ) Vit D3 and 100-500 mg of Vit C daily per instructions  -If the Covid test is positive, check out the CDC website for more information on home care, transmission and treatment for COVID19  -follow up with your doctor in 2-3 days unless improving and feeling better  -stay home while sick, except to seek medical care, and if you have COVID19 ideally it would be best to stay home for a full 10 days since the onset of symptoms PLUS one day of no fever and feeling better. Wear a good mask (such as N95 or KN95) if around others to reduce the risk of transmission.  It was nice to meet you today, and I really hope you are feeling better soon. I help [place] out with telemedicine visits on Tuesdays and Thursdays and am available for visits on those days. If you have any concerns or questions following this visit please schedule a follow up visit with your Primary Care doctor or seek care at a local urgent care clinic to avoid delays in care.    Seek in person care or schedule a follow up video visit promptly if your symptoms worsen, new concerns arise or you are not improving with treatment. Call 911 and/or seek emergency care if your symptoms are severe or life threatening.

## 2020-04-23 NOTE — Progress Notes (Signed)
Virtual Visit via Telephone Note  I connected with Radiah E Vancott on 04/23/20 at  1:40 PM EST by telephone and verified that I am speaking with the correct person using two identifiers.   I discussed the limitations, risks, security and privacy concerns of performing an evaluation and management service by telephone and the availability of in person appointments. I also discussed with the patient that there may be a patient responsible charge related to this service. The patient expressed understanding and agreed to proceed.  Location patient: home, [place] Location provider: work or home office Participants present for the call: patient, provider Patient did not have a visit with me in the prior 7 days to address this/these issue(s).   History of Present Illness:  Acute telemedicine visit for sinus congestion: -Onset: 04/17/20 -Symptoms include:nasal congestion, pnd, mild chest tightness yesterday when she coughed, headache, sinus pressure, chills, temp 99 -Denies: CP, SOB, body aches, severe ha, NVD, loss of taste, inability to get out of bed/eat/drink -Has tried: echinacea, tylenol -Pertinent past medical history: obesity -Pertinent medication allergies:penicillin, erythromycin -denies any chance pregnancy -COVID-19 vaccine status: 1 covid shot about 1.5 weeks ago; had covid in the past about 5-6 months ago   Observations/Objective: Patient sounds cheerful and well on the phone. I do not appreciate any SOB. Speech and thought processing are grossly intact. Patient reported vitals:  Assessment and Plan:  Nasal congestion  Cough  -we discussed possible serious and likely etiologies, options for evaluation and workup, limitations of telemedicine visit vs in person visit, treatment, treatment risks and precautions. Pt prefers to treat via telemedicine empirically rather than in person at this moment.  Query viral upper respiratory illness, Covid versus other.  Possible bronchitis.  Flu  seems less likely given the symptoms and she opted against tamilflu.  Discussed options for Covid testing, potential complications, isolation and precautions.  She opted for empiric treatment with Tessalon for cough and albuterol in case of bronchitis.  Demonstrated proper use of the inhaler.  Did advise if she wanted to be considered for outpatient treatment of Covid, that she schedule prompt follow-up video visit if she has positive Covid test -did discuss treatment window. Work/School slipped offered: provided in patient instructions  Scheduled follow up with PCP offered: Agrees to schedule follow-up if needed. Advised to seek prompt in person care if worsening, new symptoms arise, or if is not improving with treatment. Advised of options for inperson care in case PCP office not available. Did let the patient know that I only do telemedicine shifts for [place] on Tuesdays and Thursdays and advised a follow up visit with PCP or at an [facility] if has further questions or concerns.   Follow Up Instructions:  I did not refer this patient for an OV with me in the next 24 hours for this/these issue(s).  I discussed the assessment and treatment plan with the patient. The patient was provided an opportunity to ask questions and all were answered. The patient agreed with the plan and demonstrated an understanding of the instructions.   I spent 18 minutes on the date of this visit in the care of this patient. See summary of tasks completed to properly care for this patient in the detailed notes above which also included counseling of above, review of PMH, medications, allergies, evaluation of the patient and ordering and/or  instructing patient on testing and care options.     Terressa Koyanagi, DO

## 2020-05-05 ENCOUNTER — Encounter: Payer: Self-pay | Admitting: Family Medicine

## 2020-05-05 ENCOUNTER — Other Ambulatory Visit: Payer: Self-pay

## 2020-05-05 ENCOUNTER — Ambulatory Visit (INDEPENDENT_AMBULATORY_CARE_PROVIDER_SITE_OTHER): Payer: BC Managed Care – PPO | Admitting: Family Medicine

## 2020-05-05 VITALS — BP 142/85 | HR 85 | Ht 63.0 in | Wt 233.0 lb

## 2020-05-05 DIAGNOSIS — Z124 Encounter for screening for malignant neoplasm of cervix: Secondary | ICD-10-CM

## 2020-05-05 DIAGNOSIS — Z01419 Encounter for gynecological examination (general) (routine) without abnormal findings: Secondary | ICD-10-CM | POA: Diagnosis not present

## 2020-05-05 DIAGNOSIS — F331 Major depressive disorder, recurrent, moderate: Secondary | ICD-10-CM

## 2020-05-05 MED ORDER — DULOXETINE HCL 20 MG PO CPEP: 20.0000 mg | ORAL_CAPSULE | Freq: Every day | ORAL | 3 refills | Status: DC

## 2020-05-05 NOTE — Assessment & Plan Note (Signed)
Have added Cymbalta.

## 2020-05-05 NOTE — Patient Instructions (Signed)
Preventive Care 11-44 Years Old, Female Preventive care refers to lifestyle choices and visits with your health care provider that can promote health and wellness. This includes:  A yearly physical exam. This is also called an annual wellness visit.  Regular dental and eye exams.  Immunizations.  Screening for certain conditions.  Healthy lifestyle choices, such as: ? Eating a healthy diet. ? Getting regular exercise. ? Not using drugs or products that contain nicotine and tobacco. ? Limiting alcohol use. What can I expect for my preventive care visit? Physical exam Your health care provider may check your:  Height and weight. These may be used to calculate your BMI (body mass index). BMI is a measurement that tells if you are at a healthy weight.  Heart rate and blood pressure.  Body temperature.  Skin for abnormal spots. Counseling Your health care provider may ask you questions about your:  Past medical problems.  Family's medical history.  Alcohol, tobacco, and drug use.  Emotional well-being.  Home life and relationship well-being.  Sexual activity.  Diet, exercise, and sleep habits.  Work and work Statistician.  Access to firearms.  Method of birth control.  Menstrual cycle.  Pregnancy history. What immunizations do I need? Vaccines are usually given at various ages, according to a schedule. Your health care provider will recommend vaccines for you based on your age, medical history, and lifestyle or other factors, such as travel or where you work.   What tests do I need? Blood tests  Lipid and cholesterol levels. These may be checked every 5 years starting at age 64.  Hepatitis C test.  Hepatitis B test. Screening  Diabetes screening. This is done by checking your blood sugar (glucose) after you have not eaten for a while (fasting).  STD (sexually transmitted disease) testing, if you are at risk.  BRCA-related cancer screening. This may  be done if you have a family history of breast, ovarian, tubal, or peritoneal cancers.  Pelvic exam and Pap test. This may be done every 3 years starting at age 61. Starting at age 82, this may be done every 5 years if you have a Pap test in combination with an HPV test. Talk with your health care provider about your test results, treatment options, and if necessary, the need for more tests.   Follow these instructions at home: Eating and drinking  Eat a healthy diet that includes fresh fruits and vegetables, whole grains, lean protein, and low-fat dairy products.  Take vitamin and mineral supplements as recommended by your health care provider.  Do not drink alcohol if: ? Your health care provider tells you not to drink. ? You are pregnant, may be pregnant, or are planning to become pregnant.  If you drink alcohol: ? Limit how much you have to 0-1 drink a day. ? Be aware of how much alcohol is in your drink. In the [place] one drink equals one 12 oz bottle of beer (355 mL), one 5 oz glass of wine (148 mL), or one 1 oz glass of hard liquor (44 mL).   Lifestyle  Take daily care of your teeth and gums. Brush your teeth every morning and night with fluoride toothpaste. Floss one time each day.  Stay active. Exercise for at least 30 minutes 5 or more days each week.  Do not use any products that contain nicotine or tobacco, such as cigarettes, e-cigarettes, and chewing tobacco. If you need help quitting, ask your health care provider.  Do  not use drugs.  If you are sexually active, practice safe sex. Use a condom or other form of protection to prevent STIs (sexually transmitted infections).  If you do not wish to become pregnant, use a form of birth control. If you plan to become pregnant, see your health care provider for a prepregnancy visit.  Find healthy ways to cope with stress, such as: ? Meditation, yoga, or listening to music. ? Journaling. ? Talking to a trusted  person. ? Spending time with friends and family. Safety  Always wear your seat belt while driving or riding in a vehicle.  Do not drive: ? If you have been drinking alcohol. Do not ride with someone who has been drinking. ? When you are tired or distracted. ? While texting.  Wear a helmet and other protective equipment during sports activities.  If you have firearms in your house, make sure you follow all gun safety procedures.  Seek help if you have been physically or sexually abused. What's next?  Go to your health care provider once a year for an annual wellness visit.  Ask your health care provider how often you should have your eyes and teeth checked.  Stay up to date on all vaccines. This information is not intended to replace advice given to you by your health care provider. Make sure you discuss any questions you have with your health care provider. Document Revised: 11/02/2019 Document Reviewed: 11/15/2017 Elsevier Patient Education  2021 [employer].

## 2020-05-05 NOTE — Progress Notes (Signed)
  Subjective:     Tamara Shaffer is a 45 y.o. female and is here for a comprehensive physical exam. The patient reports no problems. Minimal cycles with IUD. Placed 10/2019. She is on Effexor, high dose, wants something else.     The following portions of the patient's history were reviewed and updated as appropriate: allergies, current medications, past family history, past medical history, past social history, past surgical history and problem list.  Review of Systems Pertinent items noted in HPI and remainder of comprehensive ROS otherwise negative.   Objective:    BP (!) 142/85   Pulse 85   Ht 5\' 3"  (1.6 m)   Wt 233 lb (105.7 kg)   BMI 41.27 kg/m  General appearance: alert, cooperative and appears stated age Head: Normocephalic, without obvious abnormality, atraumatic Neck: no adenopathy, supple, symmetrical, trachea midline and thyroid not enlarged, symmetric, no tenderness/mass/nodules Lungs: clear to auscultation bilaterally Breasts: normal appearance, no masses or tenderness Heart: regular rate and rhythm, S1, S2 normal, no murmur, click, rub or gallop Abdomen: soft, non-tender; bowel sounds normal; no masses,  no organomegaly Pelvic: cervix normal in appearance, external genitalia normal, no adnexal masses or tenderness, no cervical motion tenderness, uterus normal size, shape, and consistency and vagina normal without discharge Extremities: extremities normal, atraumatic, no cyanosis or edema Pulses: 2+ and symmetric Skin: Skin color, texture, turgor normal. No rashes or lesions Lymph nodes: Cervical, supraclavicular, and axillary nodes normal. Neurologic: Grossly normal    Assessment:    Healthy female exam.      Plan:   Problem List Items Addressed This Visit      Unprioritized   MDD (major depressive disorder), recurrent episode, moderate (HCC) - Primary    Have added Cymbalta.      Relevant Medications   DULoxetine (CYMBALTA) 20 MG capsule    Other  Visit Diagnoses    Screening for malignant neoplasm of cervix       Encounter for gynecological examination without abnormal finding       Relevant Orders   MM 3D SCREEN BREAST BILATERAL     Return in 1 year (on 05/05/2021).    See After Visit Summary for Counseling Recommendations

## 2020-05-05 NOTE — Progress Notes (Signed)
Dealing with ups and downs of depression due to family member with cancer, possibly discuss changing meds or does she need to see PCP

## 2020-05-20 ENCOUNTER — Ambulatory Visit
Admission: RE | Admit: 2020-05-20 | Discharge: 2020-05-20 | Disposition: A | Discharge status: Home / Self Care | Payer: BC Managed Care – PPO | Source: Ambulatory Visit | Attending: Family Medicine | Admitting: Family Medicine

## 2020-05-20 ENCOUNTER — Other Ambulatory Visit: Payer: Self-pay

## 2020-05-20 DIAGNOSIS — Z01419 Encounter for gynecological examination (general) (routine) without abnormal findings: Secondary | ICD-10-CM

## 2020-05-20 DIAGNOSIS — Z1231 Encounter for screening mammogram for malignant neoplasm of breast: Secondary | ICD-10-CM | POA: Diagnosis present

## 2020-06-08 ENCOUNTER — Other Ambulatory Visit: Payer: Self-pay | Admitting: Family Medicine

## 2020-06-09 NOTE — Telephone Encounter (Signed)
Pharmacy requests refill on: Venlafaxine XR 75 mg   LAST REFILL: 11/21/2019 (Q-270, R-1) LAST OV: 11/21/2019 NEXT OV: Not Scheduled  PHARMACY: [id number] [place], [place]  Pharmacy requests refill on: Metoprolol Tartrate 25 mg   LAST REFILL: 11/21/2019 (Q-90, R-1) LAST OV: 11/21/2019 NEXT OV: Not Scheduled  PHARMACY: [id number] [place], [place]

## 2020-06-10 NOTE — Telephone Encounter (Signed)
ERx 

## 2020-07-05 ENCOUNTER — Other Ambulatory Visit: Payer: Self-pay

## 2020-07-05 MED ORDER — MIRABEGRON ER 50 MG PO TB24
50.0000 mg | ORAL_TABLET | Freq: Every day | ORAL | 0 refills | Status: DC
Start: 2020-07-05 — End: 2020-12-13

## 2020-07-05 NOTE — Telephone Encounter (Signed)
Patient called requesting a refill, she needs a 1year follow up, apt was made she was given a 30 day temp script to last to apt

## 2020-07-26 ENCOUNTER — Ambulatory Visit: Payer: Self-pay | Admitting: Urology

## 2020-09-02 ENCOUNTER — Telehealth: Payer: Self-pay | Admitting: Family Medicine

## 2020-09-02 NOTE — Telephone Encounter (Signed)
Noted  

## 2020-09-02 NOTE — Telephone Encounter (Signed)
E-scribed refills.  Plz schedule lab and cpe visits.  

## 2020-09-02 NOTE — Telephone Encounter (Signed)
Patient is scheduled EM 

## 2020-09-13 ENCOUNTER — Encounter: Payer: Self-pay | Admitting: Family Medicine

## 2020-09-13 ENCOUNTER — Other Ambulatory Visit: Payer: Self-pay

## 2020-09-13 ENCOUNTER — Ambulatory Visit: Payer: Self-pay | Admitting: Urology

## 2020-09-13 ENCOUNTER — Ambulatory Visit: Payer: BC Managed Care – PPO | Admitting: Family Medicine

## 2020-09-13 VITALS — BP 122/70 | HR 83 | Temp 97.6°F | Ht 63.0 in | Wt 239.2 lb

## 2020-09-13 DIAGNOSIS — E039 Hypothyroidism, unspecified: Secondary | ICD-10-CM

## 2020-09-13 DIAGNOSIS — N39498 Other specified urinary incontinence: Secondary | ICD-10-CM

## 2020-09-13 DIAGNOSIS — F331 Major depressive disorder, recurrent, moderate: Secondary | ICD-10-CM | POA: Diagnosis not present

## 2020-09-13 DIAGNOSIS — G43009 Migraine without aura, not intractable, without status migrainosus: Secondary | ICD-10-CM

## 2020-09-13 DIAGNOSIS — R232 Flushing: Secondary | ICD-10-CM

## 2020-09-13 DIAGNOSIS — Z13220 Encounter for screening for lipoid disorders: Secondary | ICD-10-CM | POA: Diagnosis not present

## 2020-09-13 MED ORDER — METOPROLOL TARTRATE 25 MG PO TABS
25.0000 mg | ORAL_TABLET | Freq: Every day | ORAL | 3 refills | Status: DC
Start: 2020-09-13 — End: 2021-10-24

## 2020-09-13 NOTE — Progress Notes (Signed)
Patient ID: Tamara Shaffer, female    DOB: [birth year], 45 y.o.   MRN: [id number]  This visit was conducted in person.  BP 122/70   Pulse 83   Temp 97.6 F (36.4 C) (Temporal)   Ht 5\' 3"  (1.6 m)   Wt 239 lb 4 oz (108.5 kg)   SpO2 98%   BMI 42.38 kg/m    CC: hot flashes, weight gain  Subjective:   HPI: Tamara Shaffer is a 45 y.o. female presenting on 09/13/2020 for Hot Flashes (C/o hot flashes and wt gain.  Hot flashes started yrs ago.  Recently they are daily and multiple times daily.  After some research, found out that caffeine can worsen hot flashes.  Pt stopped caffeine for past week and noticed a huge difference. )   Longstanding h/o hot flashes that started at time of hypothyroidism dx ~10 yrs ago. Notes they are worse during stressful periods. Acutely worse over the past 1+ month - was having 5-7 hot flashes per day. Stopped caffeine cold 09/15/2020 1 week ago - has noticed marked improvement in hot flashes to <1/day. Now only drinking 1 cup of decaf coffee/day.   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  Some weight gain noted.  Energy levels ok.   Cymbalta recently added by GYN for mood. This was a way to taper off effexor however she continues full dose effexor - will follow up with GYN for ongoing cross-taper.   LMP last week - regular monthly periods despite mirena IUD (2018).   Seeing uro next month for ongoing urinary incontinence.      Relevant past medical, surgical, family and social history reviewed and updated as indicated. Interim medical history since our last visit reviewed. Allergies and medications reviewed and updated. Outpatient Medications Prior to Visit  Medication Sig Dispense Refill   Ascorbic Acid (VITAMIN C PO) Take by mouth daily.     Cholecalciferol (VITAMIN D) 2000 units CAPS Take 1 capsule (2,000 Units total) by mouth daily. 30 capsule    cyanocobalamin 500 MCG TABS Take 500 mcg by mouth daily.     DULoxetine (CYMBALTA) 20 MG  capsule Take 1 capsule (20 mg total) by mouth daily. 90 capsule 3   ELDERBERRY PO Take by mouth daily.     hydrochlorothiazide (HYDRODIURIL) 25 MG tablet TAKE 1 TABLET (25 MG TOTAL) BY MOUTH DAILY AS NEEDED (LEG SWELLING). 90 tablet 0   levonorgestrel (MIRENA) 20 MCG/24HR IUD 1 each by Intrauterine route once.     levothyroxine (SYNTHROID) 125 MCG tablet TAKE 1 TABLET BY MOUTH EVERY DAY BEFORE BREAKFAST 90 tablet 0   loratadine (CLARITIN) 10 MG tablet Take 10 mg by mouth daily.     mirabegron ER (MYRBETRIQ) 50 MG TB24 tablet Take 1 tablet (50 mg total) by mouth daily. 30 tablet 0   Multiple Vitamin (MULTIVITAMIN) tablet Take 1 tablet by mouth daily.     Multiple Vitamins-Minerals (ZINC PO) Take by mouth daily.     omeprazole (PRILOSEC) 20 MG capsule TAKE 1 CAPSULE BY MOUTH EVERY DAY 90 capsule 3   SUMAtriptan (IMITREX) 100 MG tablet TAKE 1 TABLET BY MOUTH ONCE AS NEEDED FOR MIGRAINE 27 tablet 4   TURMERIC CURCUMIN PO Take by mouth daily.     venlafaxine XR (EFFEXOR-XR) 75 MG 24 hr capsule TAKE 3 CAPSULES (225 MG TOTAL) BY MOUTH DAILY WITH BREAKFAST. 270 capsule 1   metoprolol tartrate (LOPRESSOR) 25 MG tablet TAKE 1 TABLET BY MOUTH EVERY DAY  90 tablet 0   topiramate (TOPAMAX) 100 MG tablet Take 1 tablet (100 mg total) by mouth at bedtime. 30 tablet 1   albuterol (PROAIR HFA) 108 (90 Base) MCG/ACT inhaler Inhale 2 puffs into the lungs every 6 (six) hours as needed for wheezing or shortness of breath. 1 each 0   benzonatate (TESSALON PERLES) 100 MG capsule Take 1 capsule (100 mg total) by mouth 3 (three) times daily as needed. 20 capsule 0   Facility-Administered Medications Prior to Visit  Medication Dose Route Frequency Provider Last Rate Last Admin   diclofenac (VOLTAREN) EC tablet 75 mg  75 mg Oral BID Reva Bores, MD         Per HPI unless specifically indicated in ROS section below Review of Systems  Objective:  BP 122/70   Pulse 83   Temp 97.6 F (36.4 C) (Temporal)   Ht 5\' 3"   (1.6 m)   Wt 239 lb 4 oz (108.5 kg)   SpO2 98%   BMI 42.38 kg/m   Wt Readings from Last 3 Encounters:  09/13/20 239 lb 4 oz (108.5 kg)  05/05/20 233 lb (105.7 kg)  11/21/19 233 lb 4 oz (105.8 kg)      Physical Exam Vitals and nursing note reviewed.  Constitutional:      Appearance: Normal appearance. She is obese. She is not ill-appearing.  Neck:     Thyroid: No thyroid mass or thyromegaly.  Cardiovascular:     Rate and Rhythm: Normal rate and regular rhythm.     Pulses: Normal pulses.     Heart sounds: Normal heart sounds. No murmur heard. Pulmonary:     Effort: Pulmonary effort is normal. No respiratory distress.     Breath sounds: Normal breath sounds. No wheezing, rhonchi or rales.  Musculoskeletal:     Right lower leg: No edema.     Left lower leg: No edema.  Skin:    General: Skin is warm and dry.     Findings: No rash.  Neurological:     Mental Status: She is alert.  Psychiatric:        Mood and Affect: Mood normal.        Behavior: Behavior normal.      Results for orders placed or performed during the hospital encounter of 11/25/19  Novel Coronavirus, NAA ([facility])   Specimen: Nasopharyngeal Swab; Nasopharyngeal(NP) swabs in vial transport medium   Nasopharynge  Patient  Result Value Ref Range   SARS-CoV-2, NAA Detected (A) Not Detected  SARS-COV-2, NAA 2 DAY TAT   Nasopharynge  Patient  Result Value Ref Range   SARS-CoV-2, NAA 2 DAY TAT Performed     Assessment & Plan:  This visit occurred during the SARS-CoV-2 public health emergency.  Safety protocols were in place, including screening questions prior to the visit, additional usage of staff PPE, and extensive cleaning of exam room while observing appropriate contact time as indicated for disinfecting solutions.   Problem List Items Addressed This Visit     MDD (major depressive disorder), recurrent episode, moderate (HCC)    Encouraged f/u with GYN for ongoing cross taper however ?cymbalta effect on  recent worsening hot flashes        Hypothyroidism    Continues regular levothyroxine. Update labs as due.        Relevant Medications   metoprolol tartrate (LOPRESSOR) 25 MG tablet   Other Relevant Orders   TSH   T4, free   STRESS INCONTINENCE  Planned uro f/u       Migraine without aura    Stable period. Has been off topamax for over a year.  Unclear why she's on metoprolol 25mg  once daily - consider trial off in the future - will refill for now.        Relevant Medications   metoprolol tartrate (LOPRESSOR) 25 MG tablet   Hot flashes - Primary    Continued regular periods.  Limiting caffeine has significantly helped hot flashes.  She was also recently started on cymbalta which can be associated with hot flashes - I recommended she contact GYN for continued cross-taper as she's currently on 2 SNRIs.  Check labs r/o other causes of hot flashes.        Relevant Medications   metoprolol tartrate (LOPRESSOR) 25 MG tablet   Other Relevant Orders   Comprehensive metabolic panel   CBC with Differential/Platelet   Other Visit Diagnoses     Lipid screening       Relevant Orders   Lipid panel        Meds ordered this encounter  Medications   metoprolol tartrate (LOPRESSOR) 25 MG tablet    Sig: Take 1 tablet (25 mg total) by mouth daily.    Dispense:  90 tablet    Refill:  3    Orders Placed This Encounter  Procedures   Lipid panel   Comprehensive metabolic panel   TSH   CBC with Differential/Platelet   T4, free    Patient Instructions  Call GYN for plan for effexor/cymbalta cross taper.  I agree with holding caffeine. Labs today  Metoprolol refilled  Cancel September appointment, reschedule physical for 2023.   Follow up plan: Return if symptoms worsen or fail to improve.  2024, MD

## 2020-09-13 NOTE — Patient Instructions (Addendum)
Call GYN for plan for effexor/cymbalta cross taper.  I agree with holding caffeine. Labs today  Metoprolol refilled  Cancel September appointment, reschedule physical for 2023.

## 2020-09-14 LAB — COMPREHENSIVE METABOLIC PANEL
ALT: 18 U/L (ref 0–35)
AST: 14 U/L (ref 0–37)
Albumin: 4.2 g/dL (ref 3.5–5.2)
Alkaline Phosphatase: 62 U/L (ref 39–117)
BUN: 14 mg/dL (ref 6–23)
CO2: 28 mEq/L (ref 19–32)
Calcium: 9.2 mg/dL (ref 8.4–10.5)
Chloride: 97 mEq/L (ref 96–112)
Creatinine, Ser: 0.79 mg/dL (ref 0.40–1.20)
GFR: 90.93 mL/min (ref >60.00)
Glucose, Bld: 74 mg/dL (ref 70–99)
Potassium: 3.5 mEq/L (ref 3.5–5.1)
Sodium: 135 mEq/L (ref 135–145)
Total Bilirubin: 0.4 mg/dL (ref 0.2–1.2)
Total Protein: 7.7 g/dL (ref 6.0–8.3)

## 2020-09-14 LAB — CBC WITH DIFFERENTIAL/PLATELET
Basophils Absolute: 0.2 10*3/uL — ABNORMAL HIGH (ref 0.0–0.1)
Basophils Relative: 1.4 % (ref 0.0–3.0)
Eosinophils Absolute: 0.5 10*3/uL (ref 0.0–0.7)
Eosinophils Relative: 3.6 % (ref 0.0–5.0)
HCT: 39.1 % (ref 36.0–46.0)
Hemoglobin: 12.7 g/dL (ref 12.0–15.0)
Lymphocytes Relative: 25.1 % (ref 12.0–46.0)
Lymphs Abs: 3.3 10*3/uL (ref 0.7–4.0)
MCHC: 32.4 g/dL (ref 30.0–36.0)
MCV: 82.5 fl (ref 78.0–100.0)
Monocytes Absolute: 0.7 10*3/uL (ref 0.1–1.0)
Monocytes Relative: 5.3 % (ref 3.0–12.0)
Neutro Abs: 8.4 10*3/uL — ABNORMAL HIGH (ref 1.4–7.7)
Neutrophils Relative %: 64.6 % (ref 43.0–77.0)
Platelets: 263 10*3/uL (ref 150.0–400.0)
RBC: 4.75 Mil/uL (ref 3.87–5.11)
RDW: 14.7 % (ref 11.5–15.5)
WBC: 13.1 10*3/uL — ABNORMAL HIGH (ref 4.0–10.5)

## 2020-09-14 LAB — LIPID PANEL
Cholesterol: 182 mg/dL (ref 0–200)
HDL: 48.2 mg/dL
LDL Cholesterol: 97 mg/dL (ref 0–99)
NonHDL: 134.01
Total CHOL/HDL Ratio: 4
Triglycerides: 186 mg/dL — ABNORMAL HIGH (ref 0.0–149.0)
VLDL: 37.2 mg/dL (ref 0.0–40.0)

## 2020-09-14 LAB — TSH: TSH: 1.47 u[IU]/mL (ref 0.35–4.50)

## 2020-09-14 LAB — T4, FREE: Free T4: 0.76 ng/dL (ref 0.60–1.60)

## 2020-09-14 NOTE — Assessment & Plan Note (Signed)
Continues regular levothyroxine. Update labs as due.

## 2020-09-14 NOTE — Assessment & Plan Note (Deleted)
Continues working towards sustainable weight loss with healthy diet choices.

## 2020-09-14 NOTE — Assessment & Plan Note (Signed)
Planned uro f/u

## 2020-09-14 NOTE — Assessment & Plan Note (Addendum)
Stable period. Has been off topamax for over a year.  Unclear why she's on metoprolol 25mg  once daily - consider trial off in the future - will refill for now.

## 2020-09-14 NOTE — Assessment & Plan Note (Signed)
Encouraged f/u with GYN for ongoing cross taper however ?cymbalta effect on recent worsening hot flashes

## 2020-09-14 NOTE — Assessment & Plan Note (Addendum)
Continued regular periods.  Limiting caffeine has significantly helped hot flashes.  She was also recently started on cymbalta which can be associated with hot flashes - I recommended she contact GYN for continued cross-taper as she's currently on 2 SNRIs.  Check labs r/o other causes of hot flashes.

## 2020-09-15 ENCOUNTER — Encounter: Payer: Self-pay | Admitting: Family Medicine

## 2020-09-15 ENCOUNTER — Telehealth: Payer: BC Managed Care – PPO | Admitting: Family Medicine

## 2020-09-15 ENCOUNTER — Telehealth (INDEPENDENT_AMBULATORY_CARE_PROVIDER_SITE_OTHER): Payer: BC Managed Care – PPO | Admitting: Family Medicine

## 2020-09-15 ENCOUNTER — Telehealth: Payer: Self-pay | Admitting: Family Medicine

## 2020-09-15 VITALS — Temp 99.8°F | Ht 63.0 in | Wt 236.0 lb

## 2020-09-15 DIAGNOSIS — U071 COVID-19: Secondary | ICD-10-CM

## 2020-09-15 MED ORDER — NIRMATRELVIR/RITONAVIR (PAXLOVID)TABLET: 3.0000 | ORAL_TABLET | Freq: Two times a day (BID) | ORAL | 0 refills | Status: AC

## 2020-09-15 NOTE — Telephone Encounter (Signed)
Plz schedule virtual visit for today -12:30pm or 4:30pm

## 2020-09-15 NOTE — Assessment & Plan Note (Addendum)
Recommend:  Full dose Paxlovid Drug interactions:  1. Mirena - may increase progesterone effect, monitor for toxicities  2. omeprazole - rec stop while on antiviral Reviewed how Paxlovid is current approved under EUA. Reviewed expected course of illness, anticipated course of recovery, as well as red flags to suggested COVID pneumonia or to seek urgent in-person care. Reviewed latest CDC isolation/quarantining guidelines.  Encouraged fluids and rest. Reviewed further supportive care measures at home including vit C, vit D, zinc, tylenol PRN.

## 2020-09-15 NOTE — Telephone Encounter (Signed)
Spoke with pt offering MyChart visit.  She agrees to 12:30.  Plz add to Dr. Timoteo Expose schedule.

## 2020-09-15 NOTE — Telephone Encounter (Signed)
Pt added to schedule

## 2020-09-15 NOTE — Telephone Encounter (Signed)
Tamara Shaffer called in and stated that she was here Monday and then yesterday she started expericing  symptoms on yesterday and she tested and it came back positive.

## 2020-09-15 NOTE — Progress Notes (Addendum)
Patient ID: Tamara Shaffer, female    DOB: 08/10/1975, 45 y.o.   MRN: [id number]  Virtual visit completed through MyChart, a video enabled telemedicine application. Due to national recommendations of social distancing due to COVID-19, a virtual visit is felt to be most appropriate for this patient at this time. Reviewed limitations, risks, security and privacy concerns of performing a virtual visit and the availability of in person appointments. I also reviewed that there may be a patient responsible charge related to this service. The patient agreed to proceed.   Patient location: home Provider location: [facility], office Persons participating in this virtual visit: patient, provider   If any vitals were documented, they were collected by patient at home unless specified below.    Temp 99.8 F (37.7 C)   Ht 5\' 3"  (1.6 m)   Wt 236 lb (107 kg)   BMI 41.81 kg/m    CC: COVID  Subjective:   HPI: Tamara Shaffer is a 45 y.o. female presenting on 09/15/2020 for Cough (C/o cough, nasal congestion, chills, fever- max 100 and fatigue.  Sxs started yesterday.  Pt had positive home COVID test.  Moderna booster 05/12/20.)   First day of symptoms: 09/14/2020 Tested COVID positive: 09/14/2020  Current symptoms: fatigue, dry cough, rhinorrhea, body aches, fever Tmax 100. Loose stools. PNdrainage.  No: loss of taste/smell, dyspnea, abd pain, nausea, diarrhea, ST.  Treatments to date: tylenol, advil  Risk factors include: obesity BMI >40  COVID vaccination status: Moderna early 2022 x2     Relevant past medical, surgical, family and social history reviewed and updated as indicated. Interim medical history since our last visit reviewed. Allergies and medications reviewed and updated. Outpatient Medications Prior to Visit  Medication Sig Dispense Refill   Ascorbic Acid (VITAMIN C PO) Take by mouth daily.     Cholecalciferol (VITAMIN D) 2000 units CAPS Take 1 capsule (2,000 Units  total) by mouth daily. 30 capsule    cyanocobalamin 500 MCG TABS Take 500 mcg by mouth daily.     DULoxetine (CYMBALTA) 20 MG capsule Take 1 capsule (20 mg total) by mouth daily. 90 capsule 3   ELDERBERRY PO Take by mouth daily.     hydrochlorothiazide (HYDRODIURIL) 25 MG tablet TAKE 1 TABLET (25 MG TOTAL) BY MOUTH DAILY AS NEEDED (LEG SWELLING). 90 tablet 0   levonorgestrel (MIRENA) 20 MCG/24HR IUD 1 each by Intrauterine route once.     levothyroxine (SYNTHROID) 125 MCG tablet TAKE 1 TABLET BY MOUTH EVERY DAY BEFORE BREAKFAST 90 tablet 0   loratadine (CLARITIN) 10 MG tablet Take 10 mg by mouth daily.     metoprolol tartrate (LOPRESSOR) 25 MG tablet Take 1 tablet (25 mg total) by mouth daily. 90 tablet 3   mirabegron ER (MYRBETRIQ) 50 MG TB24 tablet Take 1 tablet (50 mg total) by mouth daily. 30 tablet 0   Multiple Vitamin (MULTIVITAMIN) tablet Take 1 tablet by mouth daily.     Multiple Vitamins-Minerals (ZINC PO) Take by mouth daily.     omeprazole (PRILOSEC) 20 MG capsule TAKE 1 CAPSULE BY MOUTH EVERY DAY 90 capsule 3   SUMAtriptan (IMITREX) 100 MG tablet TAKE 1 TABLET BY MOUTH ONCE AS NEEDED FOR MIGRAINE 27 tablet 4   TURMERIC CURCUMIN PO Take by mouth daily.     venlafaxine XR (EFFEXOR-XR) 75 MG 24 hr capsule TAKE 3 CAPSULES (225 MG TOTAL) BY MOUTH DAILY WITH BREAKFAST. 270 capsule 1   Facility-Administered Medications Prior to Visit  Medication Dose Route Frequency Provider Last Rate Last Admin   diclofenac (VOLTAREN) EC tablet 75 mg  75 mg Oral BID Reva Bores, MD         Per HPI unless specifically indicated in ROS section below Review of Systems Objective:  Temp 99.8 F (37.7 C)   Ht 5\' 3"  (1.6 m)   Wt 236 lb (107 kg)   BMI 41.81 kg/m   Wt Readings from Last 3 Encounters:  09/15/20 236 lb (107 kg)  09/13/20 239 lb 4 oz (108.5 kg)  05/05/20 233 lb (105.7 kg)       Physical exam: Gen: alert, NAD, not ill appearing Pulm: speaks in complete sentences without increased  work of breathing Psych: normal mood, normal thought content      Results for orders placed or performed in visit on 09/13/20  Lipid panel  Result Value Ref Range   Cholesterol 182 0 - 200 mg/dL   Triglycerides 09/15/20 (H) 0.0 - 149.0 mg/dL   HDL 638.9 37.34 mg/dL   VLDL >28.76 0.0 - 81.1 mg/dL   LDL Cholesterol 97 0 - 99 mg/dL   Total CHOL/HDL Ratio 4    NonHDL 134.01   Comprehensive metabolic panel  Result Value Ref Range   Sodium 135 135 - 145 mEq/L   Potassium 3.5 3.5 - 5.1 mEq/L   Chloride 97 96 - 112 mEq/L   CO2 28 19 - 32 mEq/L   Glucose, Bld 74 70 - 99 mg/dL   BUN 14 6 - 23 mg/dL   Creatinine, Ser 57.2 0.40 - 1.20 mg/dL   Total Bilirubin 0.4 0.2 - 1.2 mg/dL   Alkaline Phosphatase 62 39 - 117 U/L   AST 14 0 - 37 U/L   ALT 18 0 - 35 U/L   Total Protein 7.7 6.0 - 8.3 g/dL   Albumin 4.2 3.5 - 5.2 g/dL   GFR 6.20 35.59 mL/min   Calcium 9.2 8.4 - 10.5 mg/dL  TSH  Result Value Ref Range   TSH 1.47 0.35 - 4.50 uIU/mL  CBC with Differential/Platelet  Result Value Ref Range   WBC 13.1 (H) 4.0 - 10.5 K/uL   RBC 4.75 3.87 - 5.11 Mil/uL   Hemoglobin 12.7 12.0 - 15.0 g/dL   HCT >74.16 38.4 - 53.6 %   MCV 82.5 78.0 - 100.0 fl   MCHC 32.4 30.0 - 36.0 g/dL   RDW 46.8 03.2 - 12.2 %   Platelets 263.0 150.0 - 400.0 K/uL   Neutrophils Relative % 64.6 43.0 - 77.0 %   Lymphocytes Relative 25.1 12.0 - 46.0 %   Monocytes Relative 5.3 3.0 - 12.0 %   Eosinophils Relative 3.6 0.0 - 5.0 %   Basophils Relative 1.4 0.0 - 3.0 %   Neutro Abs 8.4 (H) 1.4 - 7.7 K/uL   Lymphs Abs 3.3 0.7 - 4.0 K/uL   Monocytes Absolute 0.7 0.1 - 1.0 K/uL   Eosinophils Absolute 0.5 0.0 - 0.7 K/uL   Basophils Absolute 0.2 (H) 0.0 - 0.1 K/uL  T4, free  Result Value Ref Range   Free T4 0.76 0.60 - 1.60 ng/dL   Assessment & Plan:   Problem List Items Addressed This Visit     Severe obesity (BMI >= 40) (HCC)   COVID-19 virus infection - Primary    Recommend:  Full dose Paxlovid Drug interactions:  1.  Mirena - may increase progesterone effect, monitor for toxicities  2. omeprazole - rec stop while on antiviral Reviewed how  Paxlovid is current approved under EUA. Reviewed expected course of illness, anticipated course of recovery, as well as red flags to suggested COVID pneumonia or to seek urgent in-person care. Reviewed latest CDC isolation/quarantining guidelines.  Encouraged fluids and rest. Reviewed further supportive care measures at home including vit C, vit D, zinc, tylenol PRN.         Relevant Medications   nirmatrelvir/ritonavir EUA (PAXLOVID) TABS     Meds ordered this encounter  Medications   nirmatrelvir/ritonavir EUA (PAXLOVID) TABS    Sig: Take 3 tablets by mouth 2 (two) times daily for 5 days. (Take nirmatrelvir 150 mg two tablets twice daily for 5 days and ritonavir 100 mg one tablet twice daily for 5 days) Patient GFR is 90    Dispense:  30 tablet    Refill:  0    No orders of the defined types were placed in this encounter.   I discussed the assessment and treatment plan with the patient. The patient was provided an opportunity to ask questions and all were answered. The patient agreed with the plan and demonstrated an understanding of the instructions. The patient was advised to call back or seek an in-person evaluation if the symptoms worsen or if the condition fails to improve as anticipated.  Follow up plan: No follow-ups on file.  Eustaquio Boyden, MD

## 2020-09-15 NOTE — Telephone Encounter (Signed)
PLEASE NOTE: All timestamps contained within this report are represented as [place] Standard Time. CONFIDENTIALTY NOTICE: This fax transmission is intended only for the addressee. It contains information that is legally privileged, confidential or otherwise protected from use or disclosure. If you are not the intended recipient, you are strictly prohibited from reviewing, disclosing, copying using or disseminating any of this information or taking any action in reliance on or regarding this information. If you have received this fax in error, please notify us immediately by telephone so that we can arrange for its return to [place]. Phone: [phone], Toll-Free: [phone], Fax: [phone] Page: 1 of 2 Call Id: [id number] [facility] Day - Client TELEPHONE ADVICE RECORD Access[employer] Patient Name: Tamara Shaffer Gender: Female DOB: 1975/05/15 Age: 45 Y 5 M 27 D Return Phone Number: [phone] (Primary), [phone] (Secondary) Address: [place]/ [place]/ [place]  [address] Client [facility] Day - Client Client Site [facility] - Day Physician Eustaquio Boyden - MD Contact Type Call Who Is Calling Patient / Member / Family / Caregiver Call Type Triage / Clinical Caller Name Samuel Bouche Relationship To Patient Other Return Phone Number [phone] (Primary) Chief Complaint Nasal Congestion Reason for Call Symptomatic / Request for Health Information Initial Comment Caller has a patient on the line who began to have covid symptoms yesterday. She tested positive at home. She has congestion, low grade, and yellow snot. She is currently at 98.6. Translation No Nurse Assessment Nurse: Rinaldo Ratel, RN, [place] Date/Time [name] Time): 09/15/2020 9:17:35 AM Confirm and document reason for call. If symptomatic, describe symptoms. ---Caller states she tested positive for covid on an athome rapid test last night.  Caller reports cough, yellow mucus, and fatigue. sxs started yesterday. Current temp is 98.6-tympanic. Caller states she is vaccinated for covid. Does the patient have any new or worsening symptoms? ---Yes Will a triage be completed? ---Yes Related visit to physician within the last 2 weeks? ---No Does the PT have any chronic conditions? (i.e. diabetes, asthma, this includes High risk factors for pregnancy, etc.) ---Yes List chronic conditions. ---GERD, HTN, Thyroid disorder Is the patient pregnant or possibly pregnant? (Ask all females between the ages of 18-55) ---No Is this a behavioral health or substance abuse call? ---No Guidelines Guideline Title Affirmed Question Affirmed Notes Nurse Date/Time (Eastern Time) COVID-19 - Diagnosed or Suspected [1] HIGH RISK for severe COVID complications (e.g., weak immune Rinaldo Ratel, RN, [place] 09/15/2020 9:20:33 AM PLEASE NOTE: All timestamps contained within this report are represented as [place] Standard Time. CONFIDENTIALTY NOTICE: This fax transmission is intended only for the addressee. It contains information that is legally privileged, confidential or otherwise protected from use or disclosure. If you are not the intended recipient, you are strictly prohibited from reviewing, disclosing, copying using or disseminating any of this information or taking any action in reliance on or regarding this information. If you have received this fax in error, please notify us immediately by telephone so that we can arrange for its return to [place]. Phone: [phone], Toll-Free: [phone], Fax: [phone] Page: 2 of 2 Call Id: [id number] Guidelines Guideline Title Affirmed Question Affirmed Notes Nurse Date/Time [name] Time) system, age > 64 years, obesity with BMI > 25, pregnant, chronic lung disease or other chronic medical condition) AND [2] COVID symptoms (e.g., cough, fever) (Exceptions: Already seen by PCP and no new or  worsening symptoms.) Disp. Time [name] Time) Disposition Final User 09/15/2020 9:22:39 AM Call PCP Now Yes Rinaldo Ratel, RN, [place]  Caller Disagree/Comply Comply Caller Understands Yes PreDisposition Call Doctor Care Advice Given Per Guideline CALL PCP NOW: * I'll page the on-call provider now. If you haven't heard from the provider (or me) within 30 minutes, call again. CALL BACK IF: * You become worse Comments User: [place], [place], RN Date/Time [name] Time): 09/15/2020 9:19:33 AM BMi 42.3 User: [place], Garrison, RN Date/Time [name] Time): 09/15/2020 9:25:35 AM Caller warm transferred to [facility] on backline to schedule appt with PCP. Referrals Warm transfer to backline

## 2020-10-12 ENCOUNTER — Other Ambulatory Visit: Payer: Self-pay

## 2020-10-12 ENCOUNTER — Ambulatory Visit: Payer: BC Managed Care – PPO | Admitting: Urology

## 2020-10-12 ENCOUNTER — Encounter: Payer: Self-pay | Admitting: Urology

## 2020-10-12 VITALS — BP 122/83 | HR 92 | Ht 63.0 in | Wt 232.0 lb

## 2020-10-12 DIAGNOSIS — N3946 Mixed incontinence: Secondary | ICD-10-CM | POA: Diagnosis not present

## 2020-10-12 MED ORDER — OXYBUTYNIN CHLORIDE ER 10 MG PO TB24: 10.0000 mg | ORAL_TABLET | Freq: Every day | ORAL | 11 refills | Status: DC

## 2020-10-12 MED ORDER — SOLIFENACIN SUCCINATE 5 MG PO TABS: 5.0000 mg | ORAL_TABLET | Freq: Every day | ORAL | 11 refills | Status: DC

## 2020-10-12 NOTE — Progress Notes (Addendum)
10/12/2020 8:50 AM   Raziah E Brege 1975-04-18 [id number]  Referring provider: Eustaquio Boyden, MD [address] [place],  [place] [address]  Chief Complaint  Patient presents with   Urinary Incontinence    1year follow up    HPI: I was consulted to assist the patient is urinary continence worsening over many years.  She leaks with coughing sneezing bending lifting.  She leaks with intercourse.  She does not have urge incontinence.  She states she leaks all the time.  She wears 1 or 2 pads a day.  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  She has mild bedwetting.   She states she leaks a small amount when she goes from a sitting to standing position.  She leaks during intercourse but does not feel urgency and it does not necessarily with thrusting.     She voids every 2 hours and has good flow.  No nocturia.     No hysterectomy   On pelvic examination patient had grade 2 hypermobility the bladder neck and negative cough test.  She had a high small grade 2 cystocele.  It would not descend with a cough.  She could cause some rotational descent when she would bear down.  There would be no risk of hinge effect.  It did not reach the urethrovesical angle   Patient has mixed incontinence.  She may have an element of an overactive bladder because of the bedwetting.  Having said that it does sound primarily a stress incontinence and she can leak getting up from a chair.  Be interesting to see if she has an overactive bladder as well.    On urodynamics she did not void for 3 hours and was catheterized for 125 mL.  Maximum bladder capacity was 900 mL.  Bladder was hyposensitive.  She had low pressure overactivity reaching a pressure of 6 cm of water.  She did not leak.  At 500 mL she had moderate stress incontinence with leak point pressure 94 cm of water.  Her cough leak point pressure was 86 cm of water with mild leakage.  It was difficult for her to void in the  lab setting.  She indurated voluntary contraction at 12 cm of water.  She voided 50 mL with a maximum flow of 54 mils per second.  Maximum voiding pressure 12 cm water.  Residual 50 mL.  Bladder neck descended 2 to 3 cm.  She did not realize that she was straining to void.  She does have a small moderate cystocele.    The patient has a mixed picture and I will treat her overactive bladder first with behavioral medical therapy especially with her having mild bedwetting.  Based on urodynamics and her voiding she appears to be at high risk of retention and a urethral injectable may be the best option for her.  Persistent overactive bladder symptoms and leakage with intercourse could persist with the stress incontinence procedure.  The role of physical therapy was emphasized  Patient understands her mixed picture.  She understands that the leaking with intercourse is probably from overactivity but it is less clear.  She said this is less bothersome but the leakage when she moves and gets out of a chair it does bother her the most.  She held off on physical therapy.  I gave her oxybutynin ER 10 mg prescription and Myrbetriq 50 mg samples and prescription and reevaluate in 8 weeks.  If she still leaking I will discuss her large capacity poorly contractile bladder and the role of a sling but especially the role of a urethral bulking agent.  I mentioned her large bladder to her today  Today Frequency stable.  The patient was seen approximately 16 months ago. When the patient was taking Myrbetriq for a year she had much less frequency.  She less incontinence.  Still is not leaking with intercourse.  Several months ago her insurance would not pay for it.  She has had imipramine years ago.  Clinically not infected   PMH: Past Medical History:  Diagnosis Date   Allergic rhinitis    seasonal   Anxiety    Depression    GDM (gestational diabetes mellitus)    GERD (gastroesophageal reflux disease)     Hypothyroidism    medically treated, likely initially thyroiditis (heterogeneous gland on [place] 05/2015)   Insomnia    Low bladder compliance    [facility] (Dr. Achilles Dunk)  stress incontinence, on imipramine   Obesity    Prediabetes    A1c 5.8% 11/2010   Urinary incontinence    Vulvar burning     Surgical History: Past Surgical History:  Procedure Laterality Date   BREAST SURGERY  06/1994   Reconstruction   CHOLECYSTECTOMY     ECTOPIC PREGNANCY SURGERY  10/2008   Tube remained   REDUCTION MAMMAPLASTY Left 1996   thyroid US  06/2010   heterogeneous normal sized thyroid gland consistent with chronic thyroiditis    Home Medications:  Allergies as of 10/12/2020       Reactions   Erythromycin    REACTION: GI upset, rash, SOB   Penicillins    REACTION: as child, ok with amoxicillin in past        Medication List        Accurate as of October 12, 2020  8:50 AM. If you have any questions, ask your nurse or doctor.          DULoxetine 20 MG capsule Commonly known as: Cymbalta Take 1 capsule (20 mg total) by mouth daily.   ELDERBERRY PO Take by mouth daily.   hydrochlorothiazide 25 MG tablet Commonly known as: HYDRODIURIL TAKE 1 TABLET (25 MG TOTAL) BY MOUTH DAILY AS NEEDED (LEG SWELLING).   levonorgestrel 20 MCG/24HR IUD Commonly known as: MIRENA 1 each by Intrauterine route once.   loratadine 10 MG tablet Commonly known as: CLARITIN Take 10 mg by mouth daily.   metoprolol tartrate 25 MG tablet Commonly known as: LOPRESSOR Take 1 tablet (25 mg total) by mouth daily.   mirabegron ER 50 MG Tb24 tablet Commonly known as: MYRBETRIQ Take 1 tablet (50 mg total) by mouth daily.   multivitamin tablet Take 1 tablet by mouth daily.   omeprazole 20 MG capsule Commonly known as: PRILOSEC TAKE 1 CAPSULE BY MOUTH EVERY DAY   SUMAtriptan 100 MG tablet Commonly known as: IMITREX TAKE 1 TABLET BY MOUTH ONCE AS NEEDED FOR MIGRAINE   Synthroid 125 MCG tablet Generic drug:  levothyroxine TAKE 1 TABLET BY MOUTH EVERY DAY BEFORE BREAKFAST   TURMERIC CURCUMIN PO Take by mouth daily.   venlafaxine XR 75 MG 24 hr capsule Commonly known as: EFFEXOR-XR TAKE 3 CAPSULES (225 MG TOTAL) BY MOUTH DAILY WITH BREAKFAST.   Vitamin B 12 500 MCG Tabs Take 500 mcg by mouth daily.   VITAMIN C PO Take by mouth daily.   Vitamin D 50 MCG (2000 UT) Caps Take 1 capsule (2,000 Units  total) by mouth daily.   ZINC PO Take by mouth daily.        Allergies:  Allergies  Allergen Reactions   Erythromycin     REACTION: GI upset, rash, SOB   Penicillins     REACTION: as child, ok with amoxicillin in past    Family History: Family History  Problem Relation Age of Onset   Cancer Mother        leiomyosarcoma of abdomen   Hypertension Father    Heart disease Father        Porcine Valve Replacement   Cancer Maternal Grandmother        Ovarian cancer   Cancer Cousin        Thyroid cancer   Diabetes Neg Hx    Stroke Neg Hx    Breast cancer Neg Hx     Social History:  reports that she has never smoked. She has never used smokeless tobacco. She reports previous alcohol use. She reports that she does not use drugs.  ROS:                                        Physical Exam: There were no vitals taken for this visit.  Constitutional:  Alert and oriented, No acute distress. HEENT: [place] AT, moist mucus membranes.  Trachea midline, no masses.   Laboratory Data: Lab Results  Component Value Date   WBC 13.1 (H) 09/13/2020   HGB 12.7 09/13/2020   HCT 39.1 09/13/2020   MCV 82.5 09/13/2020   PLT 263.0 09/13/2020    Lab Results  Component Value Date   CREATININE 0.79 09/13/2020    No results found for: PSA  No results found for: TESTOSTERONE  Lab Results  Component Value Date   HGBA1C 5.4 11/06/2013    Urinalysis    Component Value Date/Time   COLORURINE YELLOW 10/24/2008 0231   APPEARANCEUR Hazy (A) 01/14/2019 1500   LABSPEC  >1.030 (H) 10/24/2008 0231   PHURINE 5.5 10/24/2008 0231   GLUCOSEU Trace (A) 01/14/2019 1500   HGBUR LARGE (A) 10/24/2008 0231   HGBUR negative 03/09/2008 1604   BILIRUBINUR Negative 01/14/2019 1500   KETONESUR 15 (A) 10/24/2008 0231   PROTEINUR Negative 01/14/2019 1500   PROTEINUR NEGATIVE 10/24/2008 0231   UROBILINOGEN 0.2 07/23/2017 1606   UROBILINOGEN 0.2 10/24/2008 0231   NITRITE Negative 01/14/2019 1500   NITRITE NEGATIVE 10/24/2008 0231   LEUKOCYTESUR Negative 01/14/2019 1500    Pertinent Imaging:   Assessment & Plan: Vesicare 5 mg 3x11.  Oxybutynin ER 10 mg 3x11.  Reassess in 8 weeks.  Call insurance company before she returns if they fail to look into step therapy  After the patient left I noted her urine she had red blood cells in the urine and we will check this again next visit.  I did send urine for culture  1. Mixed incontinence  - Urinalysis, Complete   No follow-ups on file.  Martina Sinner, MD  [facility] [address] [place], [place] [address] [phone]

## 2020-10-14 LAB — URINALYSIS, COMPLETE
Bilirubin, UA: NEGATIVE
Glucose, UA: NEGATIVE
Ketones, UA: NEGATIVE
Nitrite, UA: NEGATIVE
Protein,UA: NEGATIVE
Specific Gravity, UA: 1.025 (ref 1.005–1.030)
Urobilinogen, Ur: 0.2 mg/dL (ref 0.2–1.0)
pH, UA: 5.5 (ref 5.0–7.5)

## 2020-10-14 LAB — MICROSCOPIC EXAMINATION
Bacteria, UA: NONE SEEN
RBC, Urine: >30 /hpf — AB (ref 0–2)

## 2020-10-21 ENCOUNTER — Other Ambulatory Visit: Payer: Self-pay

## 2020-10-21 ENCOUNTER — Ambulatory Visit: Payer: BC Managed Care – PPO

## 2020-10-21 VITALS — BP 124/80 | HR 80 | Wt 231.0 lb

## 2020-10-21 DIAGNOSIS — R3915 Urgency of urination: Secondary | ICD-10-CM

## 2020-10-21 DIAGNOSIS — R339 Retention of urine, unspecified: Secondary | ICD-10-CM

## 2020-10-21 MED ORDER — SULFAMETHOXAZOLE-TRIMETHOPRIM 800-160 MG PO TABS: 1.0000 | ORAL_TABLET | Freq: Two times a day (BID) | ORAL | 0 refills | Status: DC

## 2020-10-21 NOTE — Progress Notes (Signed)
SUBJECTIVE: Tamara Shaffer is a 45 y.o. female who complains of urinary urgency, urinary retention, and dysuria x 3 days, without flank pain, fever, chills, or abnormal vaginal discharge or bleeding.   OBJECTIVE: Appears well, in no apparent distress.  Vital signs are normal. Urine dipstick shows positive for RBC's and positive for leukocytes.    ASSESSMENT: Urinary urgency, urinary retention, dysuria  PLAN: Treatment per orders.  Call or return to clinic prn if these symptoms worsen or fail to improve as anticipated.

## 2020-10-22 ENCOUNTER — Telehealth: Payer: Self-pay

## 2020-10-22 NOTE — Telephone Encounter (Signed)
Patient Tamara Shaffer on triage line complaining of urinary urgency and frequency. I called Tamara Shaffer she states she did see her OBGYN yesterday where they did a UA and a culture and put Tamara Shaffer on bactrim. She states she is already feeling better and will call us if symptoms worsen.

## 2020-10-23 LAB — URINE CULTURE

## 2020-11-24 ENCOUNTER — Other Ambulatory Visit: Payer: BC Managed Care – PPO

## 2020-11-29 ENCOUNTER — Other Ambulatory Visit: Payer: Self-pay | Admitting: Family Medicine

## 2020-12-01 ENCOUNTER — Encounter: Payer: BC Managed Care – PPO | Admitting: Family Medicine

## 2020-12-03 ENCOUNTER — Other Ambulatory Visit: Payer: Self-pay | Admitting: Family Medicine

## 2020-12-13 ENCOUNTER — Telehealth: Payer: Self-pay | Admitting: Urology

## 2020-12-13 ENCOUNTER — Ambulatory Visit: Payer: Self-pay | Admitting: Urology

## 2020-12-13 MED ORDER — OXYBUTYNIN CHLORIDE ER 10 MG PO TB24
10.0000 mg | ORAL_TABLET | Freq: Every day | ORAL | 11 refills | Status: DC
Start: 2020-12-13 — End: 2021-08-01

## 2020-12-13 NOTE — Telephone Encounter (Signed)
Medication refilled. Please keep follow up as scheduled

## 2020-12-13 NOTE — Telephone Encounter (Signed)
Pt missed appt on Monday 9/26 due to car trouble.  She called to r/s appt.  She said Dr Sherron Monday gave her 2 RX to try to see which one worked better for her.  She said Oxybutynin worked the best for her.  I left message for pt to call office to get appt r/s w/MacDiarmid and he doesn't have anything til 10/17.  [facility] [place]

## 2021-01-06 ENCOUNTER — Other Ambulatory Visit: Payer: Self-pay | Admitting: Family Medicine

## 2021-03-03 ENCOUNTER — Other Ambulatory Visit: Payer: Self-pay | Admitting: Family Medicine

## 2021-03-07 ENCOUNTER — Other Ambulatory Visit: Payer: Self-pay | Admitting: Family Medicine

## 2021-03-08 ENCOUNTER — Other Ambulatory Visit: Payer: Self-pay | Admitting: Physician Assistant

## 2021-03-11 ENCOUNTER — Telehealth: Payer: Self-pay | Admitting: Family Medicine

## 2021-03-11 NOTE — Telephone Encounter (Signed)
Pt called in would like to know if she can get RX omeprazo le (PRILOSEC) 20 MG capsule refilled . Please Advise [phone]

## 2021-03-11 NOTE — Telephone Encounter (Signed)
Spoke with pt about refill informing her it is prescribed by another provider.  Says she knows but they are closed on Fridays so she was trying to get someone else to fill it.  Says to disregard request.  She will purchase OTC now and will call her doctor next week.  Expresses her thanks anyway.

## 2021-03-15 ENCOUNTER — Encounter: Payer: Self-pay | Admitting: Radiology

## 2021-03-25 ENCOUNTER — Other Ambulatory Visit: Payer: Self-pay

## 2021-03-25 ENCOUNTER — Other Ambulatory Visit: Payer: Self-pay | Admitting: Family Medicine

## 2021-03-25 ENCOUNTER — Telehealth: Payer: Self-pay | Admitting: Family Medicine

## 2021-03-25 DIAGNOSIS — F331 Major depressive disorder, recurrent, moderate: Secondary | ICD-10-CM

## 2021-03-25 MED ORDER — OMEPRAZOLE 20 MG PO CPDR: 20.0000 mg | DELAYED_RELEASE_CAPSULE | Freq: Every day | ORAL | 1 refills | Status: DC

## 2021-03-25 NOTE — Progress Notes (Signed)
Rx resent per pt request.  Pt left VM requesting refill.

## 2021-03-25 NOTE — Telephone Encounter (Signed)
°  Encourage patient to contact the pharmacy for refills or they can request refills through [facility]  LAST APPOINTMENT DATE:  Please schedule appointment if longer than 1 year  NEXT APPOINTMENT DATE:  MEDICATION: venlafaxine XR (EFFEXOR-XR) 75 MG 24 hr capsule  Is the patient out of medication? yes  [facility] [place]  Let patient know to contact pharmacy at the end of the day to make sure medication is ready.  Please notify patient to allow 48-72 hours to process  CLINICAL FILLS OUT ALL BELOW:   LAST REFILL:  QTY:  REFILL DATE:    OTHER COMMENTS:    Okay for refill?  Please advise

## 2021-03-25 NOTE — Telephone Encounter (Signed)
Spoke to patient and was advised that she used an old bottle requesting a refill. Patient stated that she has checked with the pharmacy and they have a refill on hold for her and she plans on picking it up today.

## 2021-03-30 ENCOUNTER — Telehealth: Payer: Self-pay | Admitting: Family Medicine

## 2021-03-30 NOTE — Telephone Encounter (Signed)
Pt called in stated she's out of town . Her mother pass away and do not have enough of her medication with her would like her RX to be called in to [facility]  on [facility] [place] Please Advise [phone]

## 2021-03-30 NOTE — Telephone Encounter (Signed)
Spoke with pt expressing my condolences for the loss of her mother.  I asked which meds she needs.  Says [facility] [place] and they are going to get her what she needs.

## 2021-03-31 ENCOUNTER — Other Ambulatory Visit: Payer: Self-pay

## 2021-03-31 MED ORDER — VENLAFAXINE HCL ER 75 MG PO CP24: 225.0000 mg | ORAL_CAPSULE | Freq: Every day | ORAL | 0 refills | Status: DC

## 2021-03-31 NOTE — Telephone Encounter (Signed)
Pt is out of town for the death of her mother and her father being sick. She left her medication at home. She needs at least a week to be sent to attached pharmacy until she can have someone ship it to her.

## 2021-03-31 NOTE — Telephone Encounter (Signed)
E-[facility] [place] [place].

## 2021-03-31 NOTE — Addendum Note (Signed)
Addended by: Nanci Pina on: 03/31/2021 05:03 PM   Modules accepted: Orders

## 2021-03-31 NOTE — Telephone Encounter (Signed)
Pt called back she needs the venlafaxine XR (EFFEXOR-XR) 75 MG 24 hr capsule refilled to Pharmacy below, she will be out tomorrow

## 2021-04-01 NOTE — Telephone Encounter (Signed)
See previous phone note - this was taken care of.

## 2021-04-19 ENCOUNTER — Ambulatory Visit: Payer: BC Managed Care – PPO

## 2021-06-08 ENCOUNTER — Other Ambulatory Visit: Payer: Self-pay

## 2021-06-08 ENCOUNTER — Ambulatory Visit (INDEPENDENT_AMBULATORY_CARE_PROVIDER_SITE_OTHER): Payer: BC Managed Care – PPO | Admitting: Family Medicine

## 2021-06-08 ENCOUNTER — Encounter: Payer: Self-pay | Admitting: Family Medicine

## 2021-06-08 ENCOUNTER — Other Ambulatory Visit (HOSPITAL_COMMUNITY)
Admission: RE | Admit: 2021-06-08 | Discharge: 2021-06-08 | Disposition: A | Discharge status: Home / Self Care | Payer: BC Managed Care – PPO | Source: Ambulatory Visit | Attending: Family Medicine | Admitting: Family Medicine

## 2021-06-08 VITALS — BP 131/84 | HR 89 | Ht 63.0 in | Wt 241.8 lb

## 2021-06-08 DIAGNOSIS — Z124 Encounter for screening for malignant neoplasm of cervix: Secondary | ICD-10-CM

## 2021-06-08 DIAGNOSIS — N921 Excessive and frequent menstruation with irregular cycle: Secondary | ICD-10-CM | POA: Diagnosis not present

## 2021-06-08 DIAGNOSIS — B3731 Acute candidiasis of vulva and vagina: Secondary | ICD-10-CM | POA: Insufficient documentation

## 2021-06-08 DIAGNOSIS — Z01419 Encounter for gynecological examination (general) (routine) without abnormal findings: Secondary | ICD-10-CM

## 2021-06-08 DIAGNOSIS — N39498 Other specified urinary incontinence: Secondary | ICD-10-CM | POA: Diagnosis not present

## 2021-06-08 DIAGNOSIS — F331 Major depressive disorder, recurrent, moderate: Secondary | ICD-10-CM | POA: Diagnosis not present

## 2021-06-08 MED ORDER — FLUCONAZOLE 150 MG PO TABS: 150.0000 mg | ORAL_TABLET | Freq: Every day | ORAL | 2 refills | Status: DC

## 2021-06-08 NOTE — Assessment & Plan Note (Addendum)
43 - Well controlled with IUD. ?

## 2021-06-08 NOTE — Assessment & Plan Note (Signed)
[id number] - will refer to urogyn for 2nd opinion. ?

## 2021-06-08 NOTE — Assessment & Plan Note (Signed)
[id number] - will begin taking 2.5 tablets daily x 4 weeks, then decrease to 2 tablets daily x 4 weeks and continue. Once down to 1-2 tabs, consider adding on a different SSRI--like Zoloft. Primary care can help manage this if needed. ?

## 2021-06-08 NOTE — Patient Instructions (Signed)
Preventive Care 45-45 Years Old, Female ?Preventive care refers to lifestyle choices and visits with your health care provider that can promote health and wellness. Preventive care visits are also called wellness exams. ?What can I expect for my preventive care visit? ?Counseling ?During your preventive care visit, your health care provider may ask about your: ?Medical history, including: ?Past medical problems. ?Family medical history. ?Pregnancy history. ?Current health, including: ?Menstrual cycle. ?Method of birth control. ?Emotional well-being. ?Home life and relationship well-being. ?Sexual activity and sexual health. ?Lifestyle, including: ?Alcohol, nicotine or tobacco, and drug use. ?Access to firearms. ?Diet, exercise, and sleep habits. ?Work and work Statistician. ?Sunscreen use. ?Safety issues such as seatbelt and bike helmet use. ?Physical exam ?Your health care provider may check your: ?Height and weight. These may be used to calculate your BMI (body mass index). BMI is a measurement that tells if you are at a healthy weight. ?Waist circumference. This measures the distance around your waistline. This measurement also tells if you are at a healthy weight and may help predict your risk of certain diseases, such as type 2 diabetes and high blood pressure. ?Heart rate and blood pressure. ?Body temperature. ?Skin for abnormal spots. ?What immunizations do I need? ?Vaccines are usually given at various ages, according to a schedule. Your health care provider will recommend vaccines for you based on your age, medical history, and lifestyle or other factors, such as travel or where you work. ?What tests do I need? ?Screening ?Your health care provider may recommend screening tests for certain conditions. This may include: ?Pelvic exam and Pap test. ?Lipid and cholesterol levels. ?Diabetes screening. This is done by checking your blood sugar (glucose) after you have not eaten for a while (fasting). ?Hepatitis B  test. ?Hepatitis C test. ?HIV (human immunodeficiency virus) test. ?STI (sexually transmitted infection) testing, if you are at risk. ?BRCA-related cancer screening. This may be done if you have a family history of breast, ovarian, tubal, or peritoneal cancers. ?Talk with your health care provider about your test results, treatment options, and if necessary, the need for more tests. ?Follow these instructions at home: ?Eating and drinking ? ?Eat a healthy diet that includes fresh fruits and vegetables, whole grains, lean protein, and low-fat dairy products. ?Take vitamin and mineral supplements as recommended by your health care provider. ?Do not drink alcohol if: ?Your health care provider tells you not to drink. ?You are pregnant, may be pregnant, or are planning to become pregnant. ?If you drink alcohol: ?Limit how much you have to 0-1 drink a day. ?Know how much alcohol is in your drink. In the [place] one drink equals one 12 oz bottle of beer (355 mL), one 5 oz glass of wine (148 mL), or one 1? oz glass of hard liquor (44 mL). ?Lifestyle ?Brush your teeth every morning and night with fluoride toothpaste. Floss one time each day. ?Exercise for at least 30 minutes 5 or more days each week. ?Do not use any products that contain nicotine or tobacco. These products include cigarettes, chewing tobacco, and vaping devices, such as e-cigarettes. If you need help quitting, ask your health care provider. ?Do not use drugs. ?If you are sexually active, practice safe sex. Use a condom or other form of protection to prevent STIs. ?If you do not wish to become pregnant, use a form of birth control. If you plan to become pregnant, see your health care provider for a prepregnancy visit. ?Find healthy ways to manage stress, such as: ?Meditation, yoga,  or listening to music. ?Journaling. ?Talking to a trusted person. ?Spending time with friends and family. ?Minimize exposure to UV radiation to reduce your risk of skin  cancer. ?Safety ?Always wear your seat belt while driving or riding in a vehicle. ?Do not drive: ?If you have been drinking alcohol. Do not ride with someone who has been drinking. ?If you have been using any mind-altering substances or drugs. ?While texting. ?When you are tired or distracted. ?Wear a helmet and other protective equipment during sports activities. ?If you have firearms in your house, make sure you follow all gun safety procedures. ?Seek help if you have been physically or sexually abused. ?What's next? ?Go to your health care provider once a year for an annual wellness visit. ?Ask your health care provider how often you should have your eyes and teeth checked. ?Stay up to date on all vaccines. ?This information is not intended to replace advice given to you by your health care provider. Make sure you discuss any questions you have with your health care provider. ?Document Revised: 09/01/2020 Document Reviewed: 09/01/2020 ?Elsevier Patient Education ? [place]. ? ?

## 2021-06-08 NOTE — Progress Notes (Signed)
Subjective:  ?  ? Tamara Shaffer is a 46 y.o. female and is here for a comprehensive physical exam. The patient reports problems - would like to come off her Effexor. She takes 225 mg daily and is also on Cymbalta. Works well, but thinks it is contributing to her weight gain. Has Mirena and minimal cycles with this. Had some antibiotics recently and thinks she has developed a yeast infection. ?Reports on-going urinary incontinence. Will leak with valsalva, urge and standing upright--has seen urology in the past and did not have much improvement. ? ? ?The following portions of the patient's history were reviewed and updated as appropriate: allergies, current medications, past family history, past medical history, past social history, past surgical history, and problem list. ? ?Review of Systems ?Pertinent items noted in HPI and remainder of comprehensive ROS otherwise negative.  ? ?Objective:  ? ? BP 131/84   Pulse 89   Ht 5\' 3"  (1.6 m)   Wt 241 lb 12.8 oz (109.7 kg)   BMI 42.83 kg/m?  ?General appearance: alert, cooperative, appears stated age, and moderately obese ?Head: Normocephalic, without obvious abnormality, atraumatic ?Neck: no adenopathy, supple, symmetrical, trachea midline, and thyroid not enlarged, symmetric, no tenderness/mass/nodules ?Lungs: clear to auscultation bilaterally ?Breasts: normal appearance, no masses or tenderness ?Heart: regular rate and rhythm, S1, S2 normal, no murmur, click, rub or gallop ?Abdomen: soft, non-tender; bowel sounds normal; no masses,  no organomegaly ?Pelvic: cervix normal in appearance, external genitalia normal, no adnexal masses or tenderness, no cervical motion tenderness, uterus normal size, shape, and consistency, vagina normal without discharge, and IUD strings noted ?Extremities: extremities normal, atraumatic, no cyanosis or edema ?Pulses: 2+ and symmetric ?Skin: Skin color, texture, turgor normal. No rashes or lesions ?Lymph nodes: Cervical,  supraclavicular, and axillary nodes normal. ?Neurologic: Grossly normal  ?  ?Assessment:  ? ? GYN female exam.    ?  ?Plan:  ? ?  ?Problem List Items Addressed This Visit   ? ?  ? Unprioritized  ? MDD (major depressive disorder), recurrent episode, moderate ([place])  ?  [address] - will begin taking 2.5 tablets daily x 4 weeks, then decrease to 2 tablets daily x 4 weeks and continue. Once down to 1-2 tabs, consider adding on a different SSRI--like Zoloft. Primary care can help manage this if needed. ?  ?  ? STRESS INCONTINENCE  ?  [id number] - will refer to urogyn for 2nd opinion. ?  ?  ? Relevant Orders  ? Ambulatory referral to Urogynecology  ? Menorrhagia  ?  [address] - Well controlled with IUD. ?  ?  ? ?Other Visit Diagnoses   ? ? Screening for malignant neoplasm of cervix    -  Primary  ? [address]  ? Relevant Orders  ? Cytology - PAP( [place])  ? Encounter for gynecological examination without abnormal finding      ? [address]  ? Relevant Orders  ? MM 3D SCREEN BREAST BILATERAL  ? CBC  ? TSH  ? Hemoglobin A1c  ? Comprehensive metabolic panel  ? Lipid panel  ? Yeast vaginitis      ? [id number] - rx sent in   ? Relevant Medications  ? fluconazole (DIFLUCAN) 150 MG tablet  ? Other Relevant Orders  ? Cervicovaginal ancillary only  ? ?  ? ?Return in 1 year (on 06/09/2022). ? ?See After Visit Summary for Counseling Recommendations  ? ?

## 2021-06-09 LAB — COMPREHENSIVE METABOLIC PANEL
ALT: 26 IU/L (ref 0–32)
AST: 16 IU/L (ref 0–40)
Albumin/Globulin Ratio: 1.3 (ref 1.2–2.2)
Albumin: 4.4 g/dL (ref 3.8–4.8)
Alkaline Phosphatase: 84 IU/L (ref 44–121)
BUN/Creatinine Ratio: 14 (ref 9–23)
BUN: 12 mg/dL (ref 6–24)
Bilirubin Total: 0.3 mg/dL (ref 0.0–1.2)
CO2: 27 mmol/L (ref 20–29)
Calcium: 9.5 mg/dL (ref 8.7–10.2)
Chloride: 95 mmol/L — ABNORMAL LOW (ref 96–106)
Creatinine, Ser: 0.86 mg/dL (ref 0.57–1.00)
Globulin, Total: 3.4 g/dL (ref 1.5–4.5)
Glucose: 81 mg/dL (ref 70–99)
Potassium: 4 mmol/L (ref 3.5–5.2)
Sodium: 135 mmol/L (ref 134–144)
Total Protein: 7.8 g/dL (ref 6.0–8.5)
eGFR: 85 mL/min/{1.73_m2} (ref >59)

## 2021-06-09 LAB — CBC
Hematocrit: 38.5 % (ref 34.0–46.6)
Hemoglobin: 12.9 g/dL (ref 11.1–15.9)
MCH: 27 pg (ref 26.6–33.0)
MCHC: 33.5 g/dL (ref 31.5–35.7)
MCV: 81 fL (ref 79–97)
Platelets: 341 10*3/uL (ref 150–450)
RBC: 4.77 x10E6/uL (ref 3.77–5.28)
RDW: 13.8 % (ref 11.7–15.4)
WBC: 16.9 10*3/uL — ABNORMAL HIGH (ref 3.4–10.8)

## 2021-06-09 LAB — LIPID PANEL
Chol/HDL Ratio: 4.4 ratio (ref 0.0–4.4)
Cholesterol, Total: 203 mg/dL — ABNORMAL HIGH (ref 100–199)
HDL: 46 mg/dL (ref >39)
LDL Chol Calc (NIH): 110 mg/dL — ABNORMAL HIGH (ref 0–99)
Triglycerides: 276 mg/dL — ABNORMAL HIGH (ref 0–149)
VLDL Cholesterol Cal: 47 mg/dL — ABNORMAL HIGH (ref 5–40)

## 2021-06-09 LAB — HEMOGLOBIN A1C
Est. average glucose Bld gHb Est-mCnc: 128 mg/dL
Hgb A1c MFr Bld: 6.1 % — ABNORMAL HIGH (ref 4.8–5.6)

## 2021-06-09 LAB — TSH: TSH: 1.96 u[IU]/mL (ref 0.450–4.500)

## 2021-06-10 LAB — CERVICOVAGINAL ANCILLARY ONLY
Bacterial Vaginitis (gardnerella): NEGATIVE
Candida Glabrata: NEGATIVE
Candida Vaginitis: NEGATIVE
Comment: NEGATIVE
Comment: NEGATIVE
Comment: NEGATIVE

## 2021-06-13 LAB — CYTOLOGY - PAP
Comment: NEGATIVE
Diagnosis: NEGATIVE
High risk HPV: NEGATIVE

## 2021-06-19 ENCOUNTER — Other Ambulatory Visit: Payer: Self-pay | Admitting: Family Medicine

## 2021-06-21 NOTE — Telephone Encounter (Signed)
Spoke to patient by telephone and was advised that she does not need a refill at this time. Patient stated that she got a 3 month supply recently when she was out of town. Patient stated at this point she is tapering the medication. Patient stated for two weeks she has been taking 2 1/2 capsules daily and will do that for another two weeks. Patient stated that she will then cut back to 2 capsules daily for a month and so on.  ?

## 2021-06-25 ENCOUNTER — Other Ambulatory Visit: Payer: Self-pay | Admitting: Family Medicine

## 2021-06-25 ENCOUNTER — Other Ambulatory Visit: Payer: Self-pay | Admitting: Physician Assistant

## 2021-06-26 ENCOUNTER — Other Ambulatory Visit: Payer: Self-pay | Admitting: Family Medicine

## 2021-07-18 ENCOUNTER — Other Ambulatory Visit: Payer: Self-pay

## 2021-07-18 NOTE — Telephone Encounter (Signed)
Received refill request from pharmacy. Patient last visit was 09/13/20. Will patient need follow up scheduled?  ?

## 2021-07-19 NOTE — Telephone Encounter (Signed)
Last refill 03/31/21 #270 ?Patient has cancelled several appointments ?

## 2021-07-20 NOTE — Telephone Encounter (Signed)
This needs phone call clarification to patient as med list also has Effexor prescribed by Dr. Shawnie Pons in January and patient should not be on both medications at the same time. ?

## 2021-07-21 ENCOUNTER — Telehealth: Payer: Self-pay

## 2021-07-21 MED ORDER — LEVOTHYROXINE SODIUM 125 MCG PO TABS: ORAL_TABLET | ORAL | 0 refills | Status: DC

## 2021-07-21 NOTE — Telephone Encounter (Signed)
Lvm asking pt to call back.  Need relay Dr. G's message.  ?

## 2021-07-21 NOTE — Telephone Encounter (Signed)
E-scribed refill 

## 2021-07-22 NOTE — Telephone Encounter (Signed)
Lvm asking pt to call back.  Need relay Dr. Timoteo Expose message.  ?

## 2021-07-28 NOTE — Telephone Encounter (Signed)
Lvm asking pt to call back.  Need relay Dr. G's message.  ?

## 2021-08-01 ENCOUNTER — Encounter: Payer: Self-pay | Admitting: Family Medicine

## 2021-08-01 ENCOUNTER — Ambulatory Visit: Payer: BC Managed Care – PPO | Admitting: Family Medicine

## 2021-08-01 ENCOUNTER — Other Ambulatory Visit: Payer: Self-pay | Admitting: *Deleted

## 2021-08-01 VITALS — BP 124/80 | HR 95 | Temp 97.7°F | Ht 63.0 in | Wt 240.1 lb

## 2021-08-01 DIAGNOSIS — B9789 Other viral agents as the cause of diseases classified elsewhere: Secondary | ICD-10-CM | POA: Insufficient documentation

## 2021-08-01 DIAGNOSIS — H168 Other keratitis: Secondary | ICD-10-CM | POA: Diagnosis not present

## 2021-08-01 DIAGNOSIS — J069 Acute upper respiratory infection, unspecified: Secondary | ICD-10-CM

## 2021-08-01 DIAGNOSIS — J029 Acute pharyngitis, unspecified: Secondary | ICD-10-CM

## 2021-08-01 LAB — POCT RAPID STREP A (OFFICE): Rapid Strep A Screen: NEGATIVE

## 2021-08-01 MED ORDER — OXYBUTYNIN CHLORIDE ER 10 MG PO TB24: 10.0000 mg | ORAL_TABLET | Freq: Every day | ORAL | 11 refills | Status: DC

## 2021-08-01 NOTE — Assessment & Plan Note (Signed)
Recently saw ophtho with this dx, receiving steroid eye drops for treatment.  ?

## 2021-08-01 NOTE — Assessment & Plan Note (Addendum)
Anticipate viral URI. Supportive measures reviewed as per pt instructions. ?RST negative today.  ?Update if not improving with treatment. Red flags to notify us reviewed. Pt agrees with plan.  ?

## 2021-08-01 NOTE — Progress Notes (Signed)
? ? Patient ID: Tamara Shaffer, female    DOB: [birth year], 46 y.o.   MRN: [id number] ? ?This visit was conducted in person. ? ?BP 124/80   Pulse 95   Temp 97.7 ?F (36.5 ?C) (Temporal)   Ht 5\' 3"  (1.6 m)   Wt 240 lb 2 oz (108.9 kg)   SpO2 97%   BMI 42.54 kg/m?   ? ?CC: URI  ?Subjective:  ? ?HPI: ?Tamara Shaffer is a 46 y.o. female presenting on 08/01/2021 for Sore Throat (C/o ST, nasal drainage- greenish/yellow and fatigue.  Sxs started 07/30/21.  Neg home COVID test this AM. ) ? ? ?Dry cough, runny nose, tightness in chest over weekend.  ?She was outdoors over the past week.  ?Progressive nasal congestion, sore throat, PNDrainage, hoarseness. Some ear pain with swallowing.  ? ?No fevers/chills, chest pain or tooth pain or shortness of breath. No wheezing. Cough is keeping her up at night time.  ?She continues wearing CPAP for OSA,.  ?Known allergies, no known h/o asthma or smoking.  ? 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 No sick contacts at home.  ? ?L eye turned red over weekend as well - she saw eye doctor this morning dx viral keratitis. Treating with eye steroid drops.  ?   ? ?Relevant past medical, surgical, family and social history reviewed and updated as indicated. Interim medical history since our last visit reviewed. ?Allergies and medications reviewed and updated. ?Outpatient Medications Prior to Visit  ?Medication Sig Dispense Refill  ? Ascorbic Acid (VITAMIN C PO) Take by mouth daily.    ? Cholecalciferol (VITAMIN D) 2000 units CAPS Take 1 capsule (2,000 Units total) by mouth daily. 30 capsule   ? cyanocobalamin 500 MCG TABS Take 500 mcg by mouth daily.    ? DULoxetine (CYMBALTA) 20 MG capsule TAKE 1 CAPSULE BY MOUTH EVERY DAY 90 capsule 3  ? ELDERBERRY PO Take by mouth daily.    ? hydrochlorothiazide (HYDRODIURIL) 25 MG tablet TAKE 1 TABLET (25 MG TOTAL) BY MOUTH DAILY AS NEEDED (LEG SWELLING). 90 tablet 2  ? levonorgestrel (MIRENA) 20 MCG/24HR IUD 1 each by Intrauterine route once.    ? levothyroxine  (SYNTHROID) 125 MCG tablet TAKE 1 TABLET BY MOUTH EVERY DAY BEFORE BREAKFAST 90 tablet 0  ? loratadine (CLARITIN) 10 MG tablet Take 10 mg by mouth daily.    ? metoprolol tartrate (LOPRESSOR) 25 MG tablet Take 1 tablet (25 mg total) by mouth daily. 90 tablet 3  ? Multiple Vitamin (MULTIVITAMIN) tablet Take 1 tablet by mouth daily.    ? Multiple Vitamins-Minerals (ZINC PO) Take by mouth daily.    ? omeprazole (PRILOSEC) 20 MG capsule TAKE 1 CAPSULE BY MOUTH EVERY DAY 90 capsule 1  ? SUMAtriptan (IMITREX) 100 MG tablet TAKE 1 TABLET BY MOUTH ONCE AS NEEDED FOR MIGRAINE 27 tablet 4  ? TURMERIC CURCUMIN PO Take by mouth daily.    ? venlafaxine XR (EFFEXOR-XR) 75 MG 24 hr capsule Take 3 capsules (225 mg total) by mouth daily with breakfast. 270 capsule 0  ? oxybutynin (DITROPAN-XL) 10 MG 24 hr tablet Take 1 tablet (10 mg total) by mouth daily. (Patient not taking: Reported on 08/01/2021) 30 tablet 11  ? fluconazole (DIFLUCAN) 150 MG tablet Take 1 tablet (150 mg total) by mouth daily. 2 tablet 2  ? sulfamethoxazole-trimethoprim (BACTRIM DS) 800-160 MG tablet Take 1 tablet by mouth 2 (two) times daily. (Patient not taking: Reported on 06/08/2021) 6 tablet 0  ? ?Facility-Administered  Medications Prior to Visit  ?Medication Dose Route Frequency Provider Last Rate Last Admin  ? diclofenac (VOLTAREN) EC tablet 75 mg  75 mg Oral BID Reva BoresPratt, Tanya S, MD      ?  ? ?Per HPI unless specifically indicated in ROS section below ?Review of Systems ? ?Objective:  ?BP 124/80   Pulse 95   Temp 97.7 ?F (36.5 ?C) (Temporal)   Ht 5\' 3"  (1.6 m)   Wt 240 lb 2 oz (108.9 kg)   SpO2 97%   BMI 42.54 kg/m?   ?Wt Readings from Last 3 Encounters:  ?08/01/21 240 lb 2 oz (108.9 kg)  ?06/08/21 241 lb 12.8 oz (109.7 kg)  ?10/21/20 231 lb (104.8 kg)  ?  ?  ?Physical Exam ?Vitals and nursing note reviewed.  ?Constitutional:   ?   Appearance: Normal appearance. She is not ill-appearing.  ?HENT:  ?   Head: Normocephalic and atraumatic.  ?   Right Ear:  Hearing, tympanic membrane, ear canal and external ear normal. There is no impacted cerumen.  ?   Left Ear: Hearing, tympanic membrane, ear canal and external ear normal. There is no impacted cerumen.  ?   Nose: Mucosal edema and congestion present. No rhinorrhea.  ?   Right Turbinates: Not enlarged.  ?   Left Turbinates: Not enlarged.  ?   Right Sinus: No maxillary sinus tenderness or frontal sinus tenderness.  ?   Left Sinus: No maxillary sinus tenderness or frontal sinus tenderness.  ?   Mouth/Throat:  ?   Mouth: Mucous membranes are moist.  ?   Pharynx: Oropharynx is clear. No oropharyngeal exudate or posterior oropharyngeal erythema.  ?Eyes:  ?   Extraocular Movements: Extraocular movements intact.  ?   Conjunctiva/sclera:  ?   Right eye: Right conjunctiva is not injected.  ?   Left eye: Left conjunctiva is injected.  ?   Pupils: Pupils are equal, round, and reactive to light.  ? ?   Comments: L lateral eye injected/erythematous with bump present on lateral bulbar conjunctiva  ?Cardiovascular:  ?   Rate and Rhythm: Normal rate and regular rhythm.  ?   Pulses: Normal pulses.  ?   Heart sounds: Normal heart sounds. No murmur heard. ?Pulmonary:  ?   Effort: Pulmonary effort is normal. No respiratory distress.  ?   Breath sounds: Normal breath sounds. No wheezing, rhonchi or rales.  ?Musculoskeletal:  ?   Cervical back: Normal range of motion and neck supple. No rigidity.  ?Lymphadenopathy:  ?   Head:  ?   Right side of head: No submental, submandibular, tonsillar, preauricular or posterior auricular adenopathy.  ?   Left side of head: No submental, submandibular, tonsillar, preauricular or posterior auricular adenopathy.  ?   Cervical: No cervical adenopathy.  ?   Right cervical: No superficial cervical adenopathy. ?   Left cervical: No superficial cervical adenopathy.  ?   Upper Body:  ?   Right upper body: No supraclavicular adenopathy.  ?   Left upper body: No supraclavicular adenopathy.  ?Skin: ?   General:  Skin is warm and dry.  ?   Findings: No rash.  ?Neurological:  ?   Mental Status: She is alert.  ?Psychiatric:     ?   Mood and Affect: Mood normal.     ?   Behavior: Behavior normal.  ? ?   ?Results for orders placed or performed in visit on 08/01/21  ?POCT rapid strep A  ?Result Value  Ref Range  ? Rapid Strep A Screen Negative Negative  ? ? ?Assessment & Plan:  ? ?Problem List Items Addressed This Visit   ? ? Viral URI with cough - Primary  ?  Anticipate viral URI. Supportive measures reviewed as per pt instructions. ?RST negative today.  ?Update if not improving with treatment. Red flags to notify us reviewed. Pt agrees with plan.  ? ?  ?  ? Viral keratitis  ?  Recently saw ophtho with this dx, receiving steroid eye drops for treatment.  ? ?  ?  ? ?Other Visit Diagnoses   ? ? Sore throat      ? Relevant Orders  ? POCT rapid strep A (Completed)  ? ?  ?  ? ?No orders of the defined types were placed in this encounter. ? ?Orders Placed This Encounter  ?Procedures  ? POCT rapid strep A  ? ? ? ?Patient Instructions  ?Rapid strep test today.  ?You have a viral respiratory infection. ?Antibiotics are not needed for this.  Viral infections usually take 7-10 days to resolve.  The cough can last a few weeks to go away. ?Take robitussin, delsym OTC as needed for cough. Use flonase nasal steroid as well to help with sinus congestion.  ?Push fluids and plenty of rest. ?Please return if you are not improving as expected, or if you have high fevers (>101.5) or difficulty swallowing or worsening productive cough. ?Call clinic with questions.  Good to see you today. I hope you start feeling better soon.  ? ?Follow up plan: ?Return if symptoms worsen or fail to improve. ? ?Eustaquio Boyden, MD   ?

## 2021-08-01 NOTE — Addendum Note (Signed)
Addended by: Levada Schilling on: 08/01/2021 03:55 PM ? ? Modules accepted: Orders ? ?

## 2021-08-01 NOTE — Patient Instructions (Addendum)
Rapid strep test today.  ?You have a viral respiratory infection. ?Antibiotics are not needed for this.  Viral infections usually take 7-10 days to resolve.  The cough can last a few weeks to go away. ?Take robitussin, delsym OTC as needed for cough. Use flonase nasal steroid as well to help with sinus congestion.  ?Push fluids and plenty of rest. ?Please return if you are not improving as expected, or if you have high fevers (>101.5) or difficulty swallowing or worsening productive cough. ?Call clinic with questions.  Good to see you today. I hope you start feeling better soon.  ?

## 2021-08-02 NOTE — Telephone Encounter (Signed)
Attempted several times to contact pt with no response.  Denied request.  ?

## 2021-08-08 ENCOUNTER — Encounter: Payer: Self-pay | Admitting: Family Medicine

## 2021-08-08 ENCOUNTER — Ambulatory Visit: Payer: BC Managed Care – PPO | Admitting: Family Medicine

## 2021-08-08 VITALS — BP 122/78 | HR 72 | Temp 97.9°F | Ht 63.0 in | Wt 235.5 lb

## 2021-08-08 DIAGNOSIS — J019 Acute sinusitis, unspecified: Secondary | ICD-10-CM | POA: Diagnosis not present

## 2021-08-08 MED ORDER — DOXYCYCLINE HYCLATE 100 MG PO TABS: 100.0000 mg | ORAL_TABLET | Freq: Two times a day (BID) | ORAL | 0 refills | Status: DC

## 2021-08-08 NOTE — Assessment & Plan Note (Addendum)
Anticipate bacterial sinusitis given duration and progression of symptoms including high fever. Lungs sound clear today, good O2 sats. Rx doxycycline 100mg  10d course. Reviewed photosensitization precautions.   Update if not improving with treatment.  Note for work provided today.

## 2021-08-08 NOTE — Patient Instructions (Addendum)
You have a bacterial sinus infection. Take medicine as prescribed: doxycycline 100mg  twice daily for 10 days Push fluids and plenty of rest.  Continue flonase.  Nasal saline irrigation or neti pot to help drain sinuses.  May use plain mucinex with plenty of fluid to help mobilize mucous. Please let know if fever >101.5, trouble opening/closing mouth, difficulty swallowing, or worsening instead of improving as expected.

## 2021-08-08 NOTE — Progress Notes (Signed)
Patient ID: Tamara Shaffer, female    DOB: 1/Nyoka Lint2/1977, 46 y.o.   MRN: [id number]  This visit was conducted in person.  BP 122/78   Pulse 72   Temp 97.9 F (36.6 C) (Temporal)   Ht 5\' 3"  (1.6 m)   Wt 235 lb 8 oz (106.8 kg)   SpO2 97%   BMI 41.72 kg/m    CC: ongoing cough and sinus symptoms Subjective:   HPI: Tamara Shaffer is a 46 y.o. female presenting on 08/08/2021 for Cough (C/o ongoing cough, fever and sinus sxs. Recently seen and tx for URI.)   Seen here last week with several days of dry cough, sore throat with drainage, runny nose and nasal congestion.  Diagnosed with viral URI with cough, supportive care measures recommended. She also was concomitantly being treated by eye doctor for viral keratitis.  No better - continues sinus pressure, ST, PNDrainage. High fever over the weekend Tmax 103.5. treating with tylenol and advil. No appetite, in bed all weekend. She's continue flonase and delsym and pseudophed. This week trouble tolerating her CPAP.      Relevant past medical, surgical, family and social history reviewed and updated as indicated. Interim medical history since our last visit reviewed. Allergies and medications reviewed and updated. Outpatient Medications Prior to Visit  Medication Sig Dispense Refill   Ascorbic Acid (VITAMIN C PO) Take by mouth daily.     Cholecalciferol (VITAMIN D) 2000 units CAPS Take 1 capsule (2,000 Units total) by mouth daily. 30 capsule    cyanocobalamin 500 MCG TABS Take 500 mcg by mouth daily.     DULoxetine (CYMBALTA) 20 MG capsule TAKE 1 CAPSULE BY MOUTH EVERY DAY 90 capsule 3   ELDERBERRY PO Take by mouth daily.     hydrochlorothiazide (HYDRODIURIL) 25 MG tablet TAKE 1 TABLET (25 MG TOTAL) BY MOUTH DAILY AS NEEDED (LEG SWELLING). 90 tablet 2   levonorgestrel (MIRENA) 20 MCG/24HR IUD 1 each by Intrauterine route once.     levothyroxine (SYNTHROID) 125 MCG tablet TAKE 1 TABLET BY MOUTH EVERY DAY BEFORE BREAKFAST 90 tablet 0    loratadine (CLARITIN) 10 MG tablet Take 10 mg by mouth daily.     metoprolol tartrate (LOPRESSOR) 25 MG tablet Take 1 tablet (25 mg total) by mouth daily. 90 tablet 3   Multiple Vitamin (MULTIVITAMIN) tablet Take 1 tablet by mouth daily.     Multiple Vitamins-Minerals (ZINC PO) Take by mouth daily.     omeprazole (PRILOSEC) 20 MG capsule TAKE 1 CAPSULE BY MOUTH EVERY DAY 90 capsule 1   oxybutynin (DITROPAN-XL) 10 MG 24 hr tablet Take 1 tablet (10 mg total) by mouth daily. 30 tablet 11   SUMAtriptan (IMITREX) 100 MG tablet TAKE 1 TABLET BY MOUTH ONCE AS NEEDED FOR MIGRAINE 27 tablet 4   TURMERIC CURCUMIN PO Take by mouth daily.     venlafaxine (EFFEXOR) 25 MG tablet Take 25 mg by mouth 2 (two) times daily.     venlafaxine XR (EFFEXOR-XR) 75 MG 24 hr capsule Take 3 capsules (225 mg total) by mouth daily with breakfast. 270 capsule 0   Facility-Administered Medications Prior to Visit  Medication Dose Route Frequency Provider Last Rate Last Admin   diclofenac (VOLTAREN) EC tablet 75 mg  75 mg Oral BID Reva BoresPratt, Tanya S, MD         Per HPI unless specifically indicated in ROS section below Review of Systems  Objective:  BP 122/78   Pulse 72  Temp 97.9 F (36.6 C) (Temporal)   Ht 5\' 3"  (1.6 m)   Wt 235 lb 8 oz (106.8 kg)   SpO2 97%   BMI 41.72 kg/m   Wt Readings from Last 3 Encounters:  08/08/21 235 lb 8 oz (106.8 kg)  08/01/21 240 lb 2 oz (108.9 kg)  06/08/21 241 lb 12.8 oz (109.7 kg)      Physical Exam Vitals and nursing note reviewed.  Constitutional:      Appearance: Normal appearance. She is not ill-appearing.     Comments: Tired appearing  HENT:     Head: Normocephalic and atraumatic.     Right Ear: Tympanic membrane, ear canal and external ear normal. There is no impacted cerumen.     Left Ear: Tympanic membrane, ear canal and external ear normal. There is no impacted cerumen.     Nose: Congestion present. No rhinorrhea.     Right Sinus: No maxillary sinus tenderness or  frontal sinus tenderness.     Left Sinus: No maxillary sinus tenderness or frontal sinus tenderness.     Mouth/Throat:     Mouth: Mucous membranes are moist.     Pharynx: Oropharynx is clear. No oropharyngeal exudate or posterior oropharyngeal erythema.  Eyes:     Extraocular Movements: Extraocular movements intact.     Conjunctiva/sclera: Conjunctivae normal.     Pupils: Pupils are equal, round, and reactive to light.  Neck:     Comments: Tender at lymph nodes bilateral cervical region Cardiovascular:     Rate and Rhythm: Normal rate and regular rhythm.     Pulses: Normal pulses.     Heart sounds: Normal heart sounds. No murmur heard. Pulmonary:     Effort: Pulmonary effort is normal. No respiratory distress.     Breath sounds: Normal breath sounds. No wheezing, rhonchi or rales.  Musculoskeletal:     Cervical back: Normal range of motion and neck supple.  Lymphadenopathy:     Head:     Right side of head: No submental, submandibular, tonsillar, preauricular or posterior auricular adenopathy.     Left side of head: No submental, submandibular, tonsillar, preauricular or posterior auricular adenopathy.     Cervical: No cervical adenopathy.     Right cervical: No superficial cervical adenopathy.    Left cervical: No superficial cervical adenopathy.     Upper Body:     Right upper body: No supraclavicular adenopathy.     Left upper body: No supraclavicular adenopathy.  Skin:    Findings: No rash.  Neurological:     Mental Status: She is alert.  Psychiatric:        Mood and Affect: Mood normal.        Behavior: Behavior normal.      Results for orders placed or performed in visit on 08/01/21  POCT rapid strep A  Result Value Ref Range   Rapid Strep A Screen Negative Negative    Assessment & Plan:   Problem List Items Addressed This Visit     Acute sinusitis - Primary    Anticipate bacterial sinusitis given duration and progression of symptoms including high fever. Lungs  sound clear today, good O2 sats. Rx doxycycline 100mg  10d course. Reviewed photosensitization precautions.   Update if not improving with treatment.  Note for work provided today.        Relevant Medications   doxycycline (VIBRA-TABS) 100 MG tablet     Meds ordered this encounter  Medications   doxycycline (VIBRA-TABS) 100 MG tablet  Sig: Take 1 tablet (100 mg total) by mouth 2 (two) times daily.    Dispense:  20 tablet    Refill:  0   No orders of the defined types were placed in this encounter.    Patient Instructions  You have a bacterial sinus infection. Take medicine as prescribed: doxycycline 100mg  twice daily for 10 days Push fluids and plenty of rest.  Continue flonase.  Nasal saline irrigation or neti pot to help drain sinuses.  May use plain mucinex with plenty of fluid to help mobilize mucous. Please let know if fever >101.5, trouble opening/closing mouth, difficulty swallowing, or worsening instead of improving as expected.   Follow up plan: Return if symptoms worsen or fail to improve.  [place], MD

## 2021-10-19 ENCOUNTER — Ambulatory Visit: Payer: BC Managed Care – PPO | Admitting: Obstetrics and Gynecology

## 2021-10-23 ENCOUNTER — Other Ambulatory Visit: Payer: Self-pay | Admitting: Family Medicine

## 2021-11-13 ENCOUNTER — Other Ambulatory Visit: Payer: Self-pay | Admitting: Physician Assistant

## 2021-12-10 ENCOUNTER — Other Ambulatory Visit: Payer: Self-pay | Admitting: Family Medicine

## 2021-12-10 DIAGNOSIS — E039 Hypothyroidism, unspecified: Secondary | ICD-10-CM

## 2022-03-29 ENCOUNTER — Encounter: Payer: Self-pay | Admitting: Family Medicine

## 2022-03-29 ENCOUNTER — Ambulatory Visit: Payer: BC Managed Care – PPO | Admitting: Family Medicine

## 2022-03-29 VITALS — BP 124/72 | HR 70 | Temp 97.8°F | Ht 63.0 in | Wt 255.0 lb

## 2022-03-29 DIAGNOSIS — E039 Hypothyroidism, unspecified: Secondary | ICD-10-CM | POA: Diagnosis not present

## 2022-03-29 DIAGNOSIS — Z8349 Family history of other endocrine, nutritional and metabolic diseases: Secondary | ICD-10-CM | POA: Diagnosis not present

## 2022-03-29 DIAGNOSIS — R7303 Prediabetes: Secondary | ICD-10-CM

## 2022-03-29 DIAGNOSIS — E785 Hyperlipidemia, unspecified: Secondary | ICD-10-CM

## 2022-03-29 DIAGNOSIS — Z1211 Encounter for screening for malignant neoplasm of colon: Secondary | ICD-10-CM | POA: Diagnosis not present

## 2022-03-29 DIAGNOSIS — G4733 Obstructive sleep apnea (adult) (pediatric): Secondary | ICD-10-CM | POA: Diagnosis not present

## 2022-03-29 DIAGNOSIS — K219 Gastro-esophageal reflux disease without esophagitis: Secondary | ICD-10-CM

## 2022-03-29 DIAGNOSIS — F411 Generalized anxiety disorder: Secondary | ICD-10-CM

## 2022-03-29 DIAGNOSIS — G43009 Migraine without aura, not intractable, without status migrainosus: Secondary | ICD-10-CM

## 2022-03-29 MED ORDER — ZINC 100 MG PO TABS: 1.0000 | ORAL_TABLET | Freq: Every day | ORAL | 0 refills | Status: AC

## 2022-03-29 NOTE — Assessment & Plan Note (Signed)
Continues daily low dose PPI.  

## 2022-03-29 NOTE — Assessment & Plan Note (Signed)
Overdue for pulm f/u - will refer back to [facility] pulm.

## 2022-03-29 NOTE — Assessment & Plan Note (Addendum)
She is motivated to continue regular exercise routine and decrease portion sizes for goal sustainable weight loss.

## 2022-03-29 NOTE — Patient Instructions (Addendum)
I'm glad you've been able to wean off Effexor! Ok to stop metoprolol at this time as well.  Monitor effect on headaches.  Return for fasting labs at your convenience - check up front on schedule.  We will check all labwork at that time  We will refer you to pulmonology in [place] for sleep apnea follow up.  Pass by lab to pick up stool kit.

## 2022-03-29 NOTE — Progress Notes (Signed)
Patient ID: Tamara Shaffer, female    DOB: 1975/08/23, 47 y.o.   MRN: [id number]  This visit was conducted in person.  BP 124/72   Pulse 70   Temp 97.8 F (36.6 C) (Temporal)   Ht 5\' 3"  (1.6 m)   Wt 255 lb (115.7 kg)   SpO2 96%   BMI 45.17 kg/m    CC: discuss OSA, medications Subjective:   HPI: Tamara Shaffer is a 46 y.o. female presenting on 03/29/2022 for Medical Management of Chronic Issues (Here for OSA f/u. Needs new referral to pulmonology. Wants to discuss meds. )   1 year anniversary of mother's passing.   OSA - HST 04/2016 with AHI 35, on autoCPAP 5-15 cm H2O. This was previously followed by Dr deDios last seen 2018. Continues using CPAP 6-8 hours nightly with benefit.   Hypothyroidism - on Synthroid 15mcg daily for past 15 yrs. Ran out of synthroid 12/2021, difficulty filling due to insurance issue. Just received supply last week of 02/2022 but hasn't yet started.   Depression/anxiety - desires to come off Effexor XR 75mg  3 daily. She's been able to slowly wean off effexor from 05/2021 to 11/2021, currently fully off this. She was started on Cymbalta 10mg  by OBGYN. She did see grief counselor 11/2021  Son dropped 80 lbs this year. She's started working out with him and watching portion sizes.   Colon cancer screening - discussed. Request iFOB.   Grandfather with hemochromatosis.     Relevant past medical, surgical, family and social history reviewed and updated as indicated. Interim medical history since our last visit reviewed. Allergies and medications reviewed and updated. Outpatient Medications Prior to Visit  Medication Sig Dispense Refill   Ascorbic Acid (VITAMIN C PO) Take by mouth daily.     Cholecalciferol (VITAMIN D) 2000 units CAPS Take 1 capsule (2,000 Units total) by mouth daily. 30 capsule    cyanocobalamin 500 MCG TABS Take 500 mcg by mouth daily.     DULoxetine (CYMBALTA) 20 MG capsule TAKE 1 CAPSULE BY MOUTH EVERY DAY (Patient taking  differently: Takes 2 capsules daily) 90 capsule 3   ELDERBERRY PO Take by mouth daily.     hydrochlorothiazide (HYDRODIURIL) 25 MG tablet TAKE 1 TABLET (25 MG TOTAL) BY MOUTH DAILY AS NEEDED (LEG SWELLING). 90 tablet 2   levonorgestrel (MIRENA) 20 MCG/24HR IUD 1 each by Intrauterine route once.     loratadine (CLARITIN) 10 MG tablet Take 10 mg by mouth daily.     Multiple Vitamin (MULTIVITAMIN) tablet Take 1 tablet by mouth daily.     omeprazole (PRILOSEC) 20 MG capsule TAKE 1 CAPSULE BY MOUTH EVERY DAY 90 capsule 1   oxybutynin (DITROPAN-XL) 10 MG 24 hr tablet Take 1 tablet (10 mg total) by mouth daily. 30 tablet 11   SUMAtriptan (IMITREX) 100 MG tablet TAKE 1 TABLET BY MOUTH ONCE AS NEEDED FOR MIGRAINE 27 tablet 4   TURMERIC CURCUMIN PO Take by mouth daily.     metoprolol tartrate (LOPRESSOR) 25 MG tablet TAKE 1 TABLET (25 MG TOTAL) BY MOUTH DAILY. 90 tablet 3   Multiple Vitamins-Minerals (ZINC PO) Take by mouth daily.     levothyroxine (SYNTHROID) 125 MCG tablet TAKE 1 TABLET BY MOUTH EVERY DAY BEFORE BREAKFAST (Patient not taking: Reported on 03/29/2022) 90 tablet 0   doxycycline (VIBRA-TABS) 100 MG tablet Take 1 tablet (100 mg total) by mouth 2 (two) times daily. 20 tablet 0   venlafaxine (EFFEXOR) 25 MG tablet Take  25 mg by mouth 2 (two) times daily. (Patient not taking: Reported on 03/29/2022)     Facility-Administered Medications Prior to Visit  Medication Dose Route Frequency Provider Last Rate Last Admin   diclofenac (VOLTAREN) EC tablet 75 mg  75 mg Oral BID Donnamae Jude, MD         Per HPI unless specifically indicated in ROS section below Review of Systems  Objective:  BP 124/72   Pulse 70   Temp 97.8 F (36.6 C) (Temporal)   Ht 5\' 3"  (1.6 m)   Wt 255 lb (115.7 kg)   SpO2 96%   BMI 45.17 kg/m   Wt Readings from Last 3 Encounters:  03/29/22 255 lb (115.7 kg)  08/08/21 235 lb 8 oz (106.8 kg)  08/01/21 240 lb 2 oz (108.9 kg)      Physical Exam Vitals and nursing  note reviewed.  Constitutional:      Appearance: Normal appearance. She is not ill-appearing.  Eyes:     Extraocular Movements: Extraocular movements intact.     Pupils: Pupils are equal, round, and reactive to light.  Cardiovascular:     Rate and Rhythm: Normal rate and regular rhythm.     Pulses: Normal pulses.     Heart sounds: Normal heart sounds. No murmur heard. Pulmonary:     Effort: Pulmonary effort is normal. No respiratory distress.     Breath sounds: Normal breath sounds. No wheezing, rhonchi or rales.  Musculoskeletal:     Right lower leg: No edema.     Left lower leg: No edema.  Skin:    General: Skin is warm and dry.     Findings: No erythema.  Neurological:     Mental Status: She is alert.  Psychiatric:        Mood and Affect: Mood normal.        Behavior: Behavior normal.       Lab Results  Component Value Date   HGBA1C 6.1 (H) 06/08/2021    Lab Results  Component Value Date   TSH 1.960 06/08/2021     Assessment & Plan:   OSA on CPAP Assessment & Plan: Overdue for pulm f/u - will refer back to [facility] pulm.   Orders: -     Ambulatory referral to Pulmonology  Family history of hemochromatosis Assessment & Plan: Grandfather with h/o Hemochromatosis - check iron panel.   Orders: -     IBC panel; Future -     Ferritin; Future  Special screening for malignant neoplasms, colon -     Fecal occult blood, imunochemical; Future  Hypothyroidism, unspecified type Assessment & Plan: Hypothyroidism diagnosed after episode of postpartum thyroiditis 2012 - has been on Synthroid since then. Now off since 12/2021. Desires to check thyroid function prior to restarting. Will order TSH/fT4 when she returns for fasting labs.   Orders: -     TSH; Future -     T4, free; Future -     CBC with Differential/Platelet; Future  GAD (generalized anxiety disorder) Assessment & Plan: Chronic, was able to wean off effexor 225mg  and is now on cymbalta 20mg  daily through GYN  - she will request refill through Dr Kennon Rounds.    Migraine without aura and without status migrainosus, not intractable Assessment & Plan: Stable period. Will need to monitor migraines now off Effexor and coming off metoprolol.    Prediabetes -     Hemoglobin A1c; Future -     Basic metabolic panel; Future  Severe obesity (BMI >= 40) (HCC) Assessment & Plan: She is motivated to continue regular exercise routine and decrease portion sizes for goal sustainable weight loss.    Dyslipidemia Assessment & Plan: Update levels when she returns fasting. Not on medication at this time.   Orders: -     Lipid panel; Future -     Basic metabolic panel; Future  Gastroesophageal reflux disease, unspecified whether esophagitis present Assessment & Plan: Continues daily low dose PPI    Other orders -     Zinc; Take 1 tablet (100 mg total) by mouth daily.; Refill: 0     Patient Instructions  I'm glad you've been able to wean off Effexor! Ok to stop metoprolol at this time as well.  Monitor effect on headaches.  Return for fasting labs at your convenience - check up front on schedule.  We will check all labwork at that time  We will refer you to pulmonology in [place] for sleep apnea follow up.  Pass by lab to pick up stool kit.  Follow up plan: Return if symptoms worsen or fail to improve.  Eustaquio Boyden, MD

## 2022-03-29 NOTE — Assessment & Plan Note (Addendum)
Grandfather with h/o Hemochromatosis - check iron panel.

## 2022-03-29 NOTE — Assessment & Plan Note (Signed)
Update levels when she returns fasting. Not on medication at this time.

## 2022-03-29 NOTE — Assessment & Plan Note (Addendum)
Hypothyroidism diagnosed after episode of postpartum thyroiditis 2012 - has been on Synthroid since then. Now off since 12/2021. Desires to check thyroid function prior to restarting. Will order TSH/fT4 when she returns for fasting labs.

## 2022-03-29 NOTE — Assessment & Plan Note (Signed)
Chronic, was able to wean off effexor 225mg  and is now on cymbalta 20mg  daily through GYN - she will request refill through Dr Kennon Rounds.

## 2022-03-29 NOTE — Assessment & Plan Note (Signed)
Stable period. Will need to monitor migraines now off Effexor and coming off metoprolol.

## 2022-03-30 ENCOUNTER — Other Ambulatory Visit: Payer: Self-pay | Admitting: Family Medicine

## 2022-03-30 ENCOUNTER — Encounter: Payer: Self-pay | Admitting: Family Medicine

## 2022-03-30 DIAGNOSIS — F331 Major depressive disorder, recurrent, moderate: Secondary | ICD-10-CM

## 2022-03-30 MED ORDER — DULOXETINE HCL 40 MG PO CPEP: 40.0000 mg | ORAL_CAPSULE | Freq: Every day | ORAL | 1 refills | Status: DC

## 2022-04-06 MED ORDER — DULOXETINE HCL 40 MG PO CPEP: 40.0000 mg | ORAL_CAPSULE | Freq: Every day | ORAL | 1 refills | Status: DC

## 2022-04-06 NOTE — Addendum Note (Signed)
Addended by: Donnamae Jude on: 04/06/2022 10:13 AM   Modules accepted: Orders

## 2022-04-10 ENCOUNTER — Other Ambulatory Visit (INDEPENDENT_AMBULATORY_CARE_PROVIDER_SITE_OTHER): Payer: BC Managed Care – PPO

## 2022-04-10 DIAGNOSIS — E785 Hyperlipidemia, unspecified: Secondary | ICD-10-CM

## 2022-04-10 DIAGNOSIS — Z8349 Family history of other endocrine, nutritional and metabolic diseases: Secondary | ICD-10-CM

## 2022-04-10 DIAGNOSIS — R7303 Prediabetes: Secondary | ICD-10-CM

## 2022-04-10 DIAGNOSIS — Z1211 Encounter for screening for malignant neoplasm of colon: Secondary | ICD-10-CM

## 2022-04-10 DIAGNOSIS — E039 Hypothyroidism, unspecified: Secondary | ICD-10-CM

## 2022-04-10 NOTE — Addendum Note (Signed)
Addended by: Lennan Malone J on: 04/10/2022 07:49 AM   Modules accepted: Orders  

## 2022-04-10 NOTE — Addendum Note (Signed)
Addended by: Ellamae Sia on: 04/10/2022 07:48 AM   Modules accepted: Orders

## 2022-04-10 NOTE — Addendum Note (Signed)
Addended by: Amish Mintzer J on: 04/10/2022 07:49 AM   Modules accepted: Orders  

## 2022-04-10 NOTE — Addendum Note (Signed)
Addended by: Nicklas Mcsweeney J on: 04/10/2022 07:49 AM   Modules accepted: Orders  

## 2022-04-10 NOTE — Addendum Note (Signed)
Addended by: Abriana Saltos J on: 04/10/2022 07:49 AM   Modules accepted: Orders  

## 2022-04-10 NOTE — Addendum Note (Signed)
Addended by: Ellamae Sia on: 04/10/2022 07:49 AM   Modules accepted: Orders

## 2022-04-10 NOTE — Addendum Note (Signed)
Addended by: Tahlia Deamer J on: 04/10/2022 07:49 AM   Modules accepted: Orders  

## 2022-04-11 ENCOUNTER — Other Ambulatory Visit: Payer: Self-pay | Admitting: Family Medicine

## 2022-04-11 ENCOUNTER — Encounter: Payer: Self-pay | Admitting: Family Medicine

## 2022-04-11 DIAGNOSIS — E1169 Type 2 diabetes mellitus with other specified complication: Secondary | ICD-10-CM | POA: Insufficient documentation

## 2022-04-11 DIAGNOSIS — E039 Hypothyroidism, unspecified: Secondary | ICD-10-CM

## 2022-04-11 LAB — CBC WITH DIFFERENTIAL/PLATELET
Basophils Absolute: 0 10*3/uL (ref 0.0–0.2)
Basos: 0 %
EOS (ABSOLUTE): 0.3 10*3/uL (ref 0.0–0.4)
Eos: 3 %
Hematocrit: 39.7 % (ref 34.0–46.6)
Hemoglobin: 12.8 g/dL (ref 11.1–15.9)
Immature Grans (Abs): 0.1 10*3/uL (ref 0.0–0.1)
Immature Granulocytes: 1 %
Lymphocytes Absolute: 2.7 10*3/uL (ref 0.7–3.1)
Lymphs: 22 %
MCH: 25.8 pg — ABNORMAL LOW (ref 26.6–33.0)
MCHC: 32.2 g/dL (ref 31.5–35.7)
MCV: 80 fL (ref 79–97)
Monocytes Absolute: 0.7 10*3/uL (ref 0.1–0.9)
Monocytes: 6 %
Neutrophils Absolute: 8.5 10*3/uL — ABNORMAL HIGH (ref 1.4–7.0)
Neutrophils: 68 %
Platelets: 305 10*3/uL (ref 150–450)
RBC: 4.97 x10E6/uL (ref 3.77–5.28)
RDW: 13.3 % (ref 11.7–15.4)
WBC: 12.4 10*3/uL — ABNORMAL HIGH (ref 3.4–10.8)

## 2022-04-11 LAB — BASIC METABOLIC PANEL
BUN/Creatinine Ratio: 18 (ref 9–23)
BUN: 14 mg/dL (ref 6–24)
CO2: 22 mmol/L (ref 20–29)
Calcium: 9.2 mg/dL (ref 8.7–10.2)
Chloride: 98 mmol/L (ref 96–106)
Creatinine, Ser: 0.78 mg/dL (ref 0.57–1.00)
Glucose: 139 mg/dL — ABNORMAL HIGH (ref 70–99)
Potassium: 4 mmol/L (ref 3.5–5.2)
Sodium: 138 mmol/L (ref 134–144)
eGFR: 95 mL/min/{1.73_m2} (ref >59)

## 2022-04-11 LAB — IRON AND TIBC
Iron Saturation: 14 % — ABNORMAL LOW (ref 15–55)
Iron: 46 ug/dL (ref 27–159)
Total Iron Binding Capacity: 332 ug/dL (ref 250–450)
UIBC: 286 ug/dL (ref 131–425)

## 2022-04-11 LAB — HEMOGLOBIN A1C
Est. average glucose Bld gHb Est-mCnc: 146 mg/dL
Hgb A1c MFr Bld: 6.7 % — ABNORMAL HIGH (ref 4.8–5.6)

## 2022-04-11 LAB — LIPID PANEL
Chol/HDL Ratio: 4.3 ratio (ref 0.0–4.4)
Cholesterol, Total: 187 mg/dL (ref 100–199)
HDL: 44 mg/dL (ref >39)
LDL Chol Calc (NIH): 113 mg/dL — ABNORMAL HIGH (ref 0–99)
Triglycerides: 170 mg/dL — ABNORMAL HIGH (ref 0–149)
VLDL Cholesterol Cal: 30 mg/dL (ref 5–40)

## 2022-04-11 LAB — T4, FREE: Free T4: 0.74 ng/dL — ABNORMAL LOW (ref 0.82–1.77)

## 2022-04-11 LAB — TSH: TSH: 15.4 u[IU]/mL — ABNORMAL HIGH (ref 0.450–4.500)

## 2022-04-11 LAB — FERRITIN: Ferritin: 91 ng/mL (ref 15–150)

## 2022-04-11 MED ORDER — LEVOTHYROXINE SODIUM 125 MCG PO TABS: ORAL_TABLET | ORAL | 4 refills | Status: DC

## 2022-04-17 ENCOUNTER — Institutional Professional Consult (permissible substitution): Payer: BC Managed Care – PPO | Admitting: Student in an Organized Health Care Education/Training Program

## 2022-04-21 LAB — FECAL OCCULT BLOOD, IMMUNOCHEMICAL: Fecal Occult Bld: NEGATIVE

## 2022-04-26 ENCOUNTER — Encounter: Payer: Self-pay | Admitting: Nurse Practitioner

## 2022-04-26 ENCOUNTER — Ambulatory Visit (INDEPENDENT_AMBULATORY_CARE_PROVIDER_SITE_OTHER): Payer: BC Managed Care – PPO | Admitting: Nurse Practitioner

## 2022-04-26 VITALS — BP 126/78 | HR 79 | Ht 63.0 in | Wt 252.0 lb

## 2022-04-26 DIAGNOSIS — G4733 Obstructive sleep apnea (adult) (pediatric): Secondary | ICD-10-CM

## 2022-04-26 NOTE — Patient Instructions (Addendum)
Continue to use CPAP every night, minimum of 4-6 hours a night.  Change equipment every 30 days or as directed by DME. Wash your tubing with warm soap and water daily, hang to dry. Wash humidifier portion weekly.  Be aware of reduced alertness and do not drive or operate heavy machinery if experiencing this or drowsiness.  Exercise encouraged, as tolerated. Notify if persistent daytime sleepiness occurs even with consistent use of CPAP.  We discussed how untreated sleep apnea puts an individual at risk for cardiac arrhthymias, pulm HTN, DM, stroke and increases their risk for daytime accidents.   Orders placed for new machine - someone from [place] will contact you regarding this.  Follow up in 12 weeks with Tamara Milea Klink,NP to see how new machine is working, or sooner, if needed

## 2022-04-26 NOTE — Progress Notes (Signed)
$@Patientm$  ID: Arnell Asal, female    DOB: 01-30-1976, 47 y.o.   MRN: [id number]  Chief Complaint  Patient presents with   Consult    Pt consult she is currently using CPAP in home states that she does find it beneficial, DME is [facility]. States she is going on 5 years of therapy    Referring provider: Ria Bush, MD  HPI: 47 year old female, never smoker referred for sleep consult. Past medical history significant for migraines, allergic rhinitis, hypothyroid, DM II, MDD, GAD, HLD, obesity.   TEST/EVENTS:  05/09/2016 HST: AHI 35.3/h, SpO2 low 87%  04/26/2022: Today - sleep consult  Patient presents today for sleep consult, referred by Dr. Danise Mina.  She has been on CPAP for around 5 years now.  Prior to using CPAP, she had trouble with sleeping and was always tired.  Since she has been on CPAP, she feels like she is getting good benefit from use.  Wakes feeling rested in the morning.  Sleeping well most nights.  Does have some occasional fatigue but overall feels much improved.  Denies any residual snoring.  She does not have any issues with sleep parasomnia/paralysis.  No history of narcolepsy or cataplexy.  She would like to get a new CPAP machine if able to.  She does think she is coming due for new one. She goes to bed between 10 PM to midnight.  Falls asleep within 20 to 30 minutes.  Usually wakes once or twice a night to use the restroom.  Officially gets up around 5 AM.  No sleep aids.  She works as a Pharmacist, hospital.  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  Up 10 pounds over the last 2 years.  Last sleep study was in 2018. She has a history of migraines, diabetes.  No history of stroke.  She is never smoker.  Does not drink any alcohol.  Drinks 1 to 2 cups of coffee a day.  Second grade teacher.  Lives at home with her husband and 2 kids.  Family history of mother with leiomyosarcoma.  [place] 9  03/27/2022-04/25/2022: CPAP 5-15 cmH2O 30/30 days; 100% >4hr; average usage  8 hours 11 minutes Pressure 95th 14.4 Leaks 95th 7.2 AHI 1.1  Allergies  Allergen Reactions   Erythromycin     REACTION: GI upset, rash, SOB   Penicillins     REACTION: as child, ok with amoxicillin in past    Immunization History  Administered Date(s) Administered   Influenza Inj Mdck Quad Pf 02/01/2018   Influenza Split 05/22/2011   Influenza Whole 12/18/2007, 01/13/2010   Influenza,inj,Quad PF,6+ Mos 01/13/2013, 02/04/2014, 02/04/2015   Influenza-Unspecified 02/08/2021   Moderna Sars-Covid-2 Vaccination 03/26/2020, 05/12/2020    Past Medical History:  Diagnosis Date   Allergic rhinitis    seasonal   Anxiety    Depression    GDM (gestational diabetes mellitus)    GERD (gastroesophageal reflux disease)    Hypothyroidism    medically treated, likely initially thyroiditis (heterogeneous gland on [place] 05/2015)   Insomnia    Low bladder compliance    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     Cholecalciferol (VITAMIN D) 2000 units CAPS Take 1 capsule (2,000 Units total) by mouth daily. 30 capsule    cyanocobalamin 500 MCG TABS Take 500 mcg by mouth daily.     DULoxetine HCl 40 MG CPEP Take 40 mg by mouth daily. 90 capsule 1   ELDERBERRY PO Take by mouth daily.     hydrochlorothiazide (HYDRODIURIL) 25 MG tablet TAKE 1 TABLET (25 MG TOTAL) BY MOUTH DAILY AS NEEDED (LEG SWELLING). 90 tablet 0   levonorgestrel (MIRENA) 20 MCG/24HR IUD 1 each by Intrauterine route once.     levothyroxine (SYNTHROID) 125 MCG tablet TAKE 1 TABLET BY MOUTH EVERY DAY BEFORE BREAKFAST 90 tablet 4   loratadine (CLARITIN) 10 MG tablet  Take 10 mg by mouth daily.     metoprolol tartrate (LOPRESSOR) 25 MG tablet Take 25 mg by mouth daily.     Multiple Vitamin (MULTIVITAMIN) tablet Take 1 tablet by mouth daily.     omeprazole (PRILOSEC) 20 MG capsule TAKE 1 CAPSULE BY MOUTH EVERY DAY 90 capsule 1   oxybutynin (DITROPAN-XL) 10 MG 24 hr tablet Take 1 tablet (10 mg total) by mouth daily. 30 tablet 11   SUMAtriptan (IMITREX) 100 MG tablet TAKE 1 TABLET BY MOUTH ONCE AS NEEDED FOR MIGRAINE 27 tablet 4   TURMERIC CURCUMIN PO Take by mouth daily.     Zinc 100 MG TABS Take 1 tablet (100 mg total) by mouth daily.  0   Facility-Administered Medications Prior to Visit  Medication Dose Route Frequency Provider Last Rate Last Admin   diclofenac (VOLTAREN) EC tablet 75 mg  75 mg Oral BID Donnamae Jude, MD         Review of Systems:   Constitutional: No weight loss or gain, night sweats, fevers, chills, or lassitude. + Occasional daytime fatigue HEENT: No headaches, difficulty swallowing, tooth/dental problems, or sore throat. No sneezing, itching, ear ache, nasal congestion, or post nasal drip CV:  No chest pain, orthopnea, PND, swelling in lower extremities, anasarca, dizziness, palpitations, syncope Resp: No shortness of breath with exertion or at rest. No excess mucus or change in color of mucus. No productive or non-productive. No hemoptysis. No wheezing.  No chest wall deformity GI:  + Occasional heartburn, indigestion. No abdominal pain, nausea, vomiting, diarrhea, change in bowel habits, loss of appetite, bloody stools.  GU: No dysuria, change in color of urine, urgency or frequency.   Skin: No rash, lesions, ulcerations MSK:  No joint pain or swelling.   Neuro: No dizziness or lightheadedness.  Psych: No depression or anxiety. Mood stable.     Physical Exam:  BP 126/78   Pulse 79   Ht 5' 3"$  (1.6 m)   Wt 252 lb (114.3 kg)   SpO2 98%   BMI 44.64 kg/m   GEN: Pleasant, interactive, well-appearing obese; in no acute  distress. HEENT:  Normocephalic and atraumatic. PERRLA. Sclera white. Nasal turbinates pink, moist and patent bilaterally. No rhinorrhea present. Oropharynx pink and moist, without exudate or edema. No lesions, ulcerations, or postnasal drip. Mallampati III NECK:  Supple w/ fair ROM. No JVD present. Normal carotid impulses w/o bruits. Thyroid symmetrical with no goiter or nodules palpated. No lymphadenopathy.   CV: RRR, no m/r/g, no peripheral edema. Pulses intact, +2 bilaterally. No cyanosis, pallor or clubbing. PULMONARY:  Unlabored, regular breathing. Clear bilaterally A&P w/o wheezes/rales/rhonchi. No accessory muscle use.  GI: BS present and  normoactive. Soft, non-tender to palpation. No organomegaly or masses detected.  MSK: No erythema, warmth or tenderness. Cap refil <2 sec all extrem. No deformities or joint swelling noted.  Neuro: A/Ox3. No focal deficits noted.   Skin: Warm, no lesions or rashe Psych: Normal affect and behavior. Judgement and thought content appropriate.     Lab Results:  CBC    Component Value Date/Time   WBC 12.4 (H) 04/10/2022 0752   WBC 13.1 (H) 09/13/2020 1635   RBC 4.97 04/10/2022 0752   RBC 4.75 09/13/2020 1635   HGB 12.8 04/10/2022 0752   HCT 39.7 04/10/2022 0752   HCT 36 07/14/2010 0000   PLT 305 04/10/2022 0752   MCV 80 04/10/2022 0752   MCV 74.0 07/14/2010 0000   MCH 25.8 (L) 04/10/2022 0752   MCH 26.4 06/06/2013 1706   MCHC 32.2 04/10/2022 0752   MCHC 32.4 09/13/2020 1635   RDW 13.3 04/10/2022 0752   LYMPHSABS 2.7 04/10/2022 0752   MONOABS 0.7 09/13/2020 1635   EOSABS 0.3 04/10/2022 0752   BASOSABS 0.0 04/10/2022 0752    BMET    Component Value Date/Time   NA 138 04/10/2022 0752   K 4.0 04/10/2022 0752   CL 98 04/10/2022 0752   CO2 22 04/10/2022 0752   GLUCOSE 139 (H) 04/10/2022 0752   GLUCOSE 74 09/13/2020 1635   BUN 14 04/10/2022 0752   CREATININE 0.78 04/10/2022 0752   CREATININE 0.81 06/06/2013 1706   CALCIUM 9.2  04/10/2022 0752   GFRNONAA 78 09/27/2018 1330   GFRAA 90 09/27/2018 1330    BNP No results found for: "BNP"   Imaging:  No results found.        No data to display          No results found for: "NITRICOXIDE"      Assessment & Plan:   Severe obstructive sleep apnea Severe obstructive sleep apnea, diagnosed in 2018.  She has been on CPAP therapy since.  She has excellent compliance and continues to receive good benefit from use.  Well-controlled on current auto settings of 5-15.  No issues with drowsy driving or excessive daytime fatigue.  She understands risks of untreated sleep apnea.  Encouraged her to continue using CPAP regularly.  We will send orders for her to get a new machine.   Patient Instructions  Continue to use CPAP every night, minimum of 4-6 hours a night.  Change equipment every 30 days or as directed by DME. Wash your tubing with warm soap and water daily, hang to dry. Wash humidifier portion weekly.  Be aware of reduced alertness and do not drive or operate heavy machinery if experiencing this or drowsiness.  Exercise encouraged, as tolerated. Notify if persistent daytime sleepiness occurs even with consistent use of CPAP.  We discussed how untreated sleep apnea puts an individual at risk for cardiac arrhthymias, pulm HTN, DM, stroke and increases their risk for daytime accidents.   Orders placed for new machine - someone from [facility] will contact you regarding this.  Follow up in 12 weeks with Joellen Jersey Nadia Torr,NP to see how new machine is working, or sooner, if needed    Severe obesity (BMI >= 40) (Rachel) Healthy weight loss encouraged.  Reviewed correlation between obesity and OSA.   I spent 35 minutes of dedicated to the care of this patient on the date of this encounter to include pre-visit review of records, face-to-face time with the patient discussing conditions above, post visit ordering of testing, clinical  documentation with the electronic health  record, making appropriate referrals as documented, and communicating necessary findings to members of the patients care team.  Clayton Bibles, NP 04/28/2022  Pt aware and understands NP's role.

## 2022-04-28 ENCOUNTER — Encounter: Payer: Self-pay | Admitting: Nurse Practitioner

## 2022-04-28 ENCOUNTER — Other Ambulatory Visit: Payer: Self-pay

## 2022-04-28 DIAGNOSIS — G4733 Obstructive sleep apnea (adult) (pediatric): Secondary | ICD-10-CM | POA: Insufficient documentation

## 2022-04-28 NOTE — Assessment & Plan Note (Signed)
Healthy weight loss encouraged.  Reviewed correlation between obesity and OSA.

## 2022-04-28 NOTE — Progress Notes (Signed)
Per Caleen Essex, NP, send order in for patient to get a new CPAP.  DME is [facility].  Keep current settings of 5/15 cm H2O. Order placed.

## 2022-04-28 NOTE — Assessment & Plan Note (Signed)
Severe obstructive sleep apnea, diagnosed in 2018.  She has been on CPAP therapy since.  She has excellent compliance and continues to receive good benefit from use.  Well-controlled on current auto settings of 5-15.  No issues with drowsy driving or excessive daytime fatigue.  She understands risks of untreated sleep apnea.  Encouraged her to continue using CPAP regularly.  We will send orders for her to get a new machine.   Patient Instructions  Continue to use CPAP every night, minimum of 4-6 hours a night.  Change equipment every 30 days or as directed by DME. Wash your tubing with warm soap and water daily, hang to dry. Wash humidifier portion weekly.  Be aware of reduced alertness and do not drive or operate heavy machinery if experiencing this or drowsiness.  Exercise encouraged, as tolerated. Notify if persistent daytime sleepiness occurs even with consistent use of CPAP.  We discussed how untreated sleep apnea puts an individual at risk for cardiac arrhthymias, pulm HTN, DM, stroke and increases their risk for daytime accidents.   Orders placed for new machine - someone from [facility] will contact you regarding this.  Follow up in 12 weeks with Tamara Sandria Mcenroe,NP to see how new machine is working, or sooner, if needed

## 2022-06-02 ENCOUNTER — Encounter: Payer: Self-pay | Admitting: Family Medicine

## 2022-06-06 MED ORDER — OZEMPIC (0.25 OR 0.5 MG/DOSE) 2 MG/3ML ~~LOC~~ SOPN: PEN_INJECTOR | SUBCUTANEOUS | 3 refills | Status: AC

## 2022-06-06 NOTE — Telephone Encounter (Signed)
Ozempic started. H/o intolerance to metformin.

## 2022-06-09 ENCOUNTER — Other Ambulatory Visit: Payer: Self-pay | Admitting: Family Medicine

## 2022-06-09 DIAGNOSIS — M7989 Other specified soft tissue disorders: Secondary | ICD-10-CM

## 2022-06-20 ENCOUNTER — Telehealth: Payer: Self-pay | Admitting: Nurse Practitioner

## 2022-06-20 NOTE — Telephone Encounter (Signed)
She is in [place].

## 2022-06-20 NOTE — Telephone Encounter (Signed)
PT is not sure where she is supposed to pick up her H[employer] Supplies. Pls call to advise @ [phone] Thank you.

## 2022-06-20 NOTE — Telephone Encounter (Signed)
Called patient she found the Home care company

## 2022-06-21 ENCOUNTER — Other Ambulatory Visit: Payer: Self-pay | Admitting: Physician Assistant

## 2022-06-21 ENCOUNTER — Telehealth: Payer: Self-pay | Admitting: Family Medicine

## 2022-06-21 NOTE — Telephone Encounter (Signed)
Patient called in and stated that she needs a prior auth for her Semaglutide,0.25 or 0.5MG /DOS, (OZEMPIC, 0.25 OR 0.5 MG/DOSE,) 2 MG/3ML SOPN. Thank you!

## 2022-06-30 ENCOUNTER — Other Ambulatory Visit (HOSPITAL_COMMUNITY): Payer: Self-pay

## 2022-06-30 ENCOUNTER — Telehealth: Payer: Self-pay

## 2022-06-30 NOTE — Telephone Encounter (Signed)
PA submitted via CMM. Key: [id number]. Status pending.

## 2022-06-30 NOTE — Telephone Encounter (Signed)
Pharmacy Patient Advocate Encounter   Received notification from [facility] that prior authorization for Ozempic (0.25 or 0.5 MG/DOSE) 2MG /3ML pen-injectors is required/requested.  Per Test Claim: PA required   PA submitted on 06/30/22 to (ins) [employer] via [employer] or (Medicaid) confirmation # [id number] Status is pending

## 2022-07-03 ENCOUNTER — Other Ambulatory Visit (HOSPITAL_COMMUNITY): Payer: Self-pay

## 2022-07-03 NOTE — Telephone Encounter (Signed)
Patient Advocate Encounter  Prior Authorization for Ozempic (0.25 or 0.5 MG/DOSE) 2MG /3ML pen-injectors has been approved.    PA# [id number] Effective dates: 06/30/22 through 06/29/25

## 2022-07-03 NOTE — Telephone Encounter (Signed)
PA approved. Effective dates: 06/30/22 through 06/29/25

## 2022-07-10 ENCOUNTER — Other Ambulatory Visit (HOSPITAL_COMMUNITY): Payer: Self-pay

## 2022-07-26 ENCOUNTER — Telehealth: Payer: Self-pay | Admitting: Family Medicine

## 2022-07-26 NOTE — Telephone Encounter (Signed)
Patient contacted the office regarding medication ozempic that she takes, I could not find this medication on her list but I may be mistaken. Patient wanted to leave a message for a clinical team member to call her back regarding this medication. Started taking medication 4 weeks ago, then came down with a stomach virus on Saturday 07/22/22 and has not taken medication since then. Was worried about side effects of taking this medication while sick since she states she was very dehydrated and could not keep food down. Would like to know if it would be safe to take this medication now that she is feeling better. Please advise patient, [phone].

## 2022-07-26 NOTE — Telephone Encounter (Signed)
Ok to hold medication while ill, may restart once feeling better, bowel habits are back to normal. Has she tolerated med well over the past 4 wks since starting? Does she need 0.5mg  dose refill?

## 2022-07-27 NOTE — Telephone Encounter (Signed)
Lvm asking pt to call back.  Need to relay Dr. G's message and get answer to his questions.  °

## 2022-07-27 NOTE — Telephone Encounter (Signed)
Patient called back in returning call. Relayed message below. Patient stated that she is tolerating medication well and that she believes a refill is ready for her already but she will callback if not. Thank you!

## 2022-08-03 MED ORDER — ONDANSETRON HCL 4 MG PO TABS: 4.0000 mg | ORAL_TABLET | Freq: Three times a day (TID) | ORAL | 0 refills | Status: DC | PRN

## 2022-08-03 NOTE — Telephone Encounter (Signed)
I've sent in zofran to use PRN nausea/vomiting. She may try pepto bismol or pepcid OTC PRN indigestion or gassiness/bloating.

## 2022-08-03 NOTE — Telephone Encounter (Signed)
Spoke with pt asking for details of abd sxs. C/o diarrhea, abd pain, burping and vomiting. She's thinking now her sxs were caused by Ozempic the whole time vs a stomach bug. Plz advise.

## 2022-08-03 NOTE — Addendum Note (Signed)
Addended by: Eustaquio Boyden on: 08/03/2022 12:17 PM   Modules accepted: Orders

## 2022-08-03 NOTE — Telephone Encounter (Addendum)
Spoke pt asking about 0.25 mg Ozempic dose. States she did tolerate that dose. I relayed Dr. Timoteo Expose message. Pt verbalizes understanding. However, she is asking for suggestions of what to take, eat/drink to help with current sxs. Tried Gas-X and Imodium AD, not helping.

## 2022-08-03 NOTE — Telephone Encounter (Addendum)
Spoke with pt relaying Dr. Timoteo Expose message. Pt verbalizes understanding. States she recently tried [employer] and Pepcid and neither are working. Her husband will pick up Zofran rx and some Pedialyte, since she even vomits water. Pt wants to make Dr. Reece Agar aware that the diarrhea is almost constant. Also, seems like something is logged in lower chest/upper abd causing burping, but then she vomits.

## 2022-08-03 NOTE — Telephone Encounter (Signed)
Patient called in and stated that she started back up on the Ozempic but she is having the same stomach problems. She was wanting to know what she should do now or if there is anything else she can try. Thank you!

## 2022-08-03 NOTE — Telephone Encounter (Addendum)
Did she tolerate ozempic 0.25mg  weekly better? If so, would drop to 0.25mg  weekly dose. Retry 0.25mg  dose once GI symptoms are fully better.  If she has difficulty with with 0.25mg  dose, then recommend fully stopping medicine.

## 2022-08-03 NOTE — Addendum Note (Signed)
Addended by: Eustaquio Boyden on: 08/03/2022 10:30 AM   Modules accepted: Orders

## 2022-08-03 NOTE — Telephone Encounter (Signed)
Is she having upper abd pain going into back? Could be pancreatitis  Please schedule OV for evaluation Friday.  If concern for dehydration or worsening pain, please go to ER.

## 2022-08-04 ENCOUNTER — Ambulatory Visit: Payer: BC Managed Care – PPO | Admitting: Family Medicine

## 2022-08-04 ENCOUNTER — Encounter: Payer: Self-pay | Admitting: Family Medicine

## 2022-08-04 VITALS — BP 116/84 | HR 96 | Temp 97.3°F | Ht 63.0 in | Wt 227.4 lb

## 2022-08-04 DIAGNOSIS — R109 Unspecified abdominal pain: Secondary | ICD-10-CM

## 2022-08-04 DIAGNOSIS — Z7985 Long-term (current) use of injectable non-insulin antidiabetic drugs: Secondary | ICD-10-CM

## 2022-08-04 DIAGNOSIS — R1110 Vomiting, unspecified: Secondary | ICD-10-CM

## 2022-08-04 DIAGNOSIS — R197 Diarrhea, unspecified: Secondary | ICD-10-CM

## 2022-08-04 DIAGNOSIS — E1169 Type 2 diabetes mellitus with other specified complication: Secondary | ICD-10-CM

## 2022-08-04 LAB — COMPREHENSIVE METABOLIC PANEL
ALT: 48 IU/L — ABNORMAL HIGH (ref 0–32)
AST: 25 IU/L (ref 0–40)
Albumin/Globulin Ratio: 1.2 (ref 1.2–2.2)
Albumin: 4.5 g/dL (ref 3.9–4.9)
Alkaline Phosphatase: 83 IU/L (ref 44–121)
BUN/Creatinine Ratio: 12 (ref 9–23)
BUN: 12 mg/dL (ref 6–24)
Bilirubin Total: 0.4 mg/dL (ref 0.0–1.2)
CO2: 22 mmol/L (ref 20–29)
Calcium: 9.9 mg/dL (ref 8.7–10.2)
Chloride: 97 mmol/L (ref 96–106)
Creatinine, Ser: 1.03 mg/dL — ABNORMAL HIGH (ref 0.57–1.00)
Globulin, Total: 3.8 g/dL (ref 1.5–4.5)
Glucose: 118 mg/dL — ABNORMAL HIGH (ref 70–99)
Potassium: 3.7 mmol/L (ref 3.5–5.2)
Sodium: 136 mmol/L (ref 134–144)
Total Protein: 8.3 g/dL (ref 6.0–8.5)
eGFR: 68 mL/min/{1.73_m2} (ref >59)

## 2022-08-04 LAB — CBC WITH DIFFERENTIAL/PLATELET
Basophils Absolute: 0 10*3/uL (ref 0.0–0.2)
Basos: 0 %
EOS (ABSOLUTE): 0.6 10*3/uL — ABNORMAL HIGH (ref 0.0–0.4)
Eos: 4 %
Hematocrit: 45.1 % (ref 34.0–46.6)
Hemoglobin: 15.2 g/dL (ref 11.1–15.9)
Immature Grans (Abs): 0.1 10*3/uL (ref 0.0–0.1)
Immature Granulocytes: 1 %
Lymphocytes Absolute: 3.8 10*3/uL — ABNORMAL HIGH (ref 0.7–3.1)
Lymphs: 24 %
MCH: 26.8 pg (ref 26.6–33.0)
MCHC: 33.7 g/dL (ref 31.5–35.7)
MCV: 79 fL (ref 79–97)
Monocytes Absolute: 1 10*3/uL — ABNORMAL HIGH (ref 0.1–0.9)
Monocytes: 7 %
Neutrophils Absolute: 10.3 10*3/uL — ABNORMAL HIGH (ref 1.4–7.0)
Neutrophils: 64 %
Platelets: 372 10*3/uL (ref 150–450)
RBC: 5.68 x10E6/uL — ABNORMAL HIGH (ref 3.77–5.28)
RDW: 14.2 % (ref 11.7–15.4)
WBC: 15.8 10*3/uL — ABNORMAL HIGH (ref 3.4–10.8)

## 2022-08-04 LAB — LIPASE: Lipase: 30 U/L (ref 14–72)

## 2022-08-04 LAB — AMYLASE: Amylase: 38 U/L (ref 31–110)

## 2022-08-04 NOTE — Progress Notes (Signed)
Ph: [phone] Fax: [phone]   Patient ID: Nyoka Lint, female    DOB: 11/26/1975, 47 y.o.   MRN: [id number]  This visit was conducted in person.  BP 116/84   Pulse 96   Temp (!) 97.3 F (36.3 C) (Temporal)   Ht 5\' 3"  (1.6 m)   Wt 227 lb 6 oz (103.1 kg)   SpO2 94%   BMI 40.28 kg/m    CC: diarrhea Subjective:   HPI: Jennfer E Gebert is a 47 y.o. female presenting on 08/04/2022 for Diarrhea (C/o diarrhea, nausea, vomiting and fatigue. Sxs started 07/22/22. Recently started Zofran, helpful. Also, OTC antidiarrheal seem to be helping now. Pt was able to finally keep down toast and pretzels. Thinks sxs are due to Ozempic. )   Ozempic recently started, then increased to 0.5mg  weekly. Developed subsequent GI illness, initial presumed to be viral gastroenteritis. Ozempic held during this time, restarted once GI symptoms improved but again developed GI illness - diarrhea, nausea, vomiting, fatigue. Treated at home with OTC pepto bismol, pepcid, gas-X, alka seltzer and imodium. Last ozempic dose was 0.5mg  Friday. Recurrent symptoms started Wednesday. She did have epigastric pain without radiation to back, early satiety, epigastric fullness.   No fevers/chills, dysphagia.   30 lb weight loss in the past month since starting ozempic.   Lots of vomiting and diarrhea yesterday.  Last diarrhea was 7am this morning  Drank 12 oz water and pedialyte and piece of toast.      Relevant past medical, surgical, family and social history reviewed and updated as indicated. Interim medical history since our last visit reviewed. Allergies and medications reviewed and updated. Outpatient Medications Prior to Visit  Medication Sig Dispense Refill   Ascorbic Acid (VITAMIN C PO) Take by mouth daily.     Cholecalciferol (VITAMIN D) 2000 units CAPS Take 1 capsule (2,000 Units total) by mouth daily. 30 capsule    cyanocobalamin 500 MCG TABS Take 500 mcg by mouth daily.     DULoxetine HCl 40 MG CPEP  Take 40 mg by mouth daily. 90 capsule 1   ELDERBERRY PO Take by mouth daily.     hydrochlorothiazide (HYDRODIURIL) 25 MG tablet TAKE 1 TABLET (25 MG TOTAL) BY MOUTH DAILY AS NEEDED (LEG SWELLING). 90 tablet 0   levonorgestrel (MIRENA) 20 MCG/24HR IUD 1 each by Intrauterine route once.     levothyroxine (SYNTHROID) 125 MCG tablet TAKE 1 TABLET BY MOUTH EVERY DAY BEFORE BREAKFAST 90 tablet 4   loratadine (CLARITIN) 10 MG tablet Take 10 mg by mouth daily.     metoprolol tartrate (LOPRESSOR) 25 MG tablet Take 25 mg by mouth daily.     Multiple Vitamin (MULTIVITAMIN) tablet Take 1 tablet by mouth daily.     omeprazole (PRILOSEC) 20 MG capsule TAKE 1 CAPSULE BY MOUTH EVERY DAY 90 capsule 1   ondansetron (ZOFRAN) 4 MG tablet Take 1 tablet (4 mg total) by mouth every 8 (eight) hours as needed for nausea or vomiting. 20 tablet 0   oxybutynin (DITROPAN-XL) 10 MG 24 hr tablet Take 1 tablet (10 mg total) by mouth daily. 30 tablet 11   Semaglutide,0.25 or 0.5MG /DOS, (OZEMPIC, 0.25 OR 0.5 MG/DOSE,) 2 MG/3ML SOPN Inject 0.25 mg into the skin once a week.     SUMAtriptan (IMITREX) 100 MG tablet TAKE 1 TABLET BY MOUTH ONCE AS NEEDED FOR MIGRAINE 27 tablet 4   TURMERIC CURCUMIN PO Take by mouth daily.     Zinc 100 MG TABS Take 1  tablet (100 mg total) by mouth daily.  0   Facility-Administered Medications Prior to Visit  Medication Dose Route Frequency Provider Last Rate Last Admin   diclofenac (VOLTAREN) EC tablet 75 mg  75 mg Oral BID Reva Bores, MD         Per HPI unless specifically indicated in ROS section below Review of Systems  Objective:  BP 116/84   Pulse 96   Temp (!) 97.3 F (36.3 C) (Temporal)   Ht 5\' 3"  (1.6 m)   Wt 227 lb 6 oz (103.1 kg)   SpO2 94%   BMI 40.28 kg/m   Wt Readings from Last 3 Encounters:  08/04/22 227 lb 6 oz (103.1 kg)  04/26/22 252 lb (114.3 kg)  03/29/22 255 lb (115.7 kg)      Physical Exam Vitals and nursing note reviewed.  Constitutional:       Appearance: Normal appearance. She is not ill-appearing.  HENT:     Mouth/Throat:     Mouth: Mucous membranes are moist.     Pharynx: Oropharynx is clear. No oropharyngeal exudate or posterior oropharyngeal erythema.  Eyes:     Extraocular Movements: Extraocular movements intact.     Pupils: Pupils are equal, round, and reactive to light.  Cardiovascular:     Rate and Rhythm: Normal rate and regular rhythm.     Pulses: Normal pulses.     Heart sounds: Normal heart sounds. No murmur heard. Pulmonary:     Effort: Pulmonary effort is normal. No respiratory distress.     Breath sounds: Normal breath sounds. No wheezing, rhonchi or rales.  Abdominal:     General: Bowel sounds are normal. There is no distension.     Palpations: Abdomen is soft. There is no mass.     Tenderness: There is abdominal tenderness (mild) in the epigastric area. There is no guarding or rebound. Negative signs include Murphy's sign.     Hernia: No hernia is present.  Musculoskeletal:     Right lower leg: No edema.     Left lower leg: No edema.  Skin:    General: Skin is warm and dry.     Findings: No rash.  Neurological:     Mental Status: She is alert.  Psychiatric:        Mood and Affect: Mood normal.        Behavior: Behavior normal.       Results for orders placed or performed in visit on 04/10/22  Fecal occult blood, imunochemical   Specimen: Stool   Stool  Result Value Ref Range   Fecal Occult Bld Negative Negative  Iron and TIBC  Result Value Ref Range   Total Iron Binding Capacity 332 250 - 450 ug/dL   UIBC 161 096 - 045 ug/dL   Iron 46 27 - 409 ug/dL   Iron Saturation 14 (L) 15 - 55 %  Lipid panel  Result Value Ref Range   Cholesterol, Total 187 100 - 199 mg/dL   Triglycerides 811 (H) 0 - 149 mg/dL   HDL 44 >91 mg/dL   VLDL Cholesterol Cal 30 5 - 40 mg/dL   LDL Chol Calc (NIH) 478 (H) 0 - 99 mg/dL   Chol/HDL Ratio 4.3 0.0 - 4.4 ratio  TSH  Result Value Ref Range   TSH 15.400 (H)  0.450 - 4.500 uIU/mL  T4, free  Result Value Ref Range   Free T4 0.74 (L) 0.82 - 1.77 ng/dL  Hemoglobin G9F  Result Value  Ref Range   Hgb A1c MFr Bld 6.7 (H) 4.8 - 5.6 %   Est. average glucose Bld gHb Est-mCnc 146 mg/dL  Basic metabolic panel  Result Value Ref Range   Glucose 139 (H) 70 - 99 mg/dL   BUN 14 6 - 24 mg/dL   Creatinine, Ser 1.61 0.57 - 1.00 mg/dL   eGFR 95 >09 UE/AVW/0.98   BUN/Creatinine Ratio 18 9 - 23   Sodium 138 134 - 144 mmol/L   Potassium 4.0 3.5 - 5.2 mmol/L   Chloride 98 96 - 106 mmol/L   CO2 22 20 - 29 mmol/L   Calcium 9.2 8.7 - 10.2 mg/dL  Ferritin  Result Value Ref Range   Ferritin 91 15 - 150 ng/mL  CBC with Differential/Platelet  Result Value Ref Range   WBC 12.4 (H) 3.4 - 10.8 x10E3/uL   RBC 4.97 3.77 - 5.28 x10E6/uL   Hemoglobin 12.8 11.1 - 15.9 g/dL   Hematocrit 11.9 14.7 - 46.6 %   MCV 80 79 - 97 fL   MCH 25.8 (L) 26.6 - 33.0 pg   MCHC 32.2 31.5 - 35.7 g/dL   RDW 82.9 56.2 - 13.0 %   Platelets 305 150 - 450 x10E3/uL   Neutrophils 68 Not Estab. %   Lymphs 22 Not Estab. %   Monocytes 6 Not Estab. %   Eos 3 Not Estab. %   Basos 0 Not Estab. %   Neutrophils Absolute 8.5 (H) 1.4 - 7.0 x10E3/uL   Lymphocytes Absolute 2.7 0.7 - 3.1 x10E3/uL   Monocytes Absolute 0.7 0.1 - 0.9 x10E3/uL   EOS (ABSOLUTE) 0.3 0.0 - 0.4 x10E3/uL   Basophils Absolute 0.0 0.0 - 0.2 x10E3/uL   Immature Granulocytes 1 Not Estab. %   Immature Grans (Abs) 0.1 0.0 - 0.1 x10E3/uL    Assessment & Plan:   Problem List Items Addressed This Visit     Type 2 diabetes mellitus with other specified complication (HCC)    Ozempic started 06/2022.  Will stay off at this time due to above symptoms.       Abdominal pain, vomiting, and diarrhea - Primary    GI illness that recurrent in setting of starting ozempic, concerning for pancreatitis.  Check labs today. Stop ozempic. Supportive measures reviewed - clear liquid diet until vomiting has resolved then advanced diet as  tolerated.  If ongoing symptoms, low threshold to get contrasted CT for further evaluation for of pancreatitis.       Relevant Orders   Amylase   CBC with Differential/Platelet   Comprehensive metabolic panel   Lipase     No orders of the defined types were placed in this encounter.   Orders Placed This Encounter  Procedures   Amylase   CBC with Differential/Platelet   Comprehensive metabolic panel   Lipase    Patient Instructions  I'm concerned Ozempic may have caused pancreatitis.  Labs today.  Bowel rest until feeling better - first clear liquid diet then advance as tolerated. May continue zofran as needed for nausea.  Keep me updated - if ongoing pain, early satiety, we will check CT scan.   Follow up plan: Return if symptoms worsen or fail to improve.  Eustaquio Boyden, MD

## 2022-08-04 NOTE — Telephone Encounter (Signed)
Spoke with pt asking about any upper abd pain radiating ing her back. Denies abd pain. States diarrhea, nausea and vomiting have improved. Offered OV today. Pt agreed and is scheduled at 12:45.

## 2022-08-04 NOTE — Assessment & Plan Note (Signed)
Ozempic started 06/2022.  Will stay off at this time due to above symptoms.

## 2022-08-04 NOTE — Assessment & Plan Note (Addendum)
GI illness that recurrent in setting of starting ozempic, concerning for pancreatitis.  Check labs today. Stop ozempic. Supportive measures reviewed - clear liquid diet until vomiting has resolved then advanced diet as tolerated.  If ongoing symptoms, low threshold to get contrasted CT for further evaluation for of pancreatitis.

## 2022-08-04 NOTE — Patient Instructions (Addendum)
I'm concerned Ozempic may have caused pancreatitis.  Labs today.  Bowel rest until feeling better - first clear liquid diet then advance as tolerated. May continue zofran as needed for nausea.  Keep me updated - if ongoing pain, early satiety, we will check CT scan.

## 2022-08-05 ENCOUNTER — Other Ambulatory Visit: Payer: Self-pay | Admitting: Family Medicine

## 2022-08-05 DIAGNOSIS — R232 Flushing: Secondary | ICD-10-CM

## 2022-08-05 DIAGNOSIS — R109 Unspecified abdominal pain: Secondary | ICD-10-CM

## 2022-08-07 ENCOUNTER — Telehealth: Payer: Self-pay

## 2022-08-07 NOTE — Telephone Encounter (Signed)
Per 08/04/22 lab result note it appears Dr Reece Agar has already addressed this report. Sending note to Dr Reece Agar and G pool.

## 2022-08-07 NOTE — Telephone Encounter (Signed)
Addressed.

## 2022-08-10 ENCOUNTER — Other Ambulatory Visit: Payer: Self-pay | Admitting: Radiology

## 2022-08-10 DIAGNOSIS — R109 Unspecified abdominal pain: Secondary | ICD-10-CM

## 2022-08-10 DIAGNOSIS — R232 Flushing: Secondary | ICD-10-CM

## 2022-08-10 NOTE — Addendum Note (Signed)
Addended by: Alvina Chou on: 08/10/2022 09:57 AM   Modules accepted: Orders

## 2022-08-11 ENCOUNTER — Other Ambulatory Visit: Payer: Self-pay | Admitting: Family Medicine

## 2022-08-11 ENCOUNTER — Other Ambulatory Visit: Payer: Self-pay | Admitting: Physician Assistant

## 2022-08-11 ENCOUNTER — Telehealth: Payer: Self-pay

## 2022-08-11 DIAGNOSIS — F331 Major depressive disorder, recurrent, moderate: Secondary | ICD-10-CM

## 2022-08-11 DIAGNOSIS — M7989 Other specified soft tissue disorders: Secondary | ICD-10-CM

## 2022-08-11 NOTE — Telephone Encounter (Signed)
[facility] Last filled:  06/13/22, #90 Last OV:  08/04/22,  diarrhea Next OV:  none

## 2022-08-11 NOTE — Telephone Encounter (Signed)
Return call to pt @1555  regarding medication refill request.  Pt. Informed she has not been seen by Dr. Sherron Monday since 2022 and needs to schedule appt to get medication refilled.  Appt scheduled for 10/02/2022 - pt placed on wait list for sooner appt.  Pt. Verbalized understanding.

## 2022-08-11 NOTE — Telephone Encounter (Signed)
Pt. LM on triage line requesting refill of Oxybutnin.

## 2022-08-15 NOTE — Telephone Encounter (Signed)
ERx 

## 2022-08-18 ENCOUNTER — Encounter: Payer: Self-pay | Admitting: Family Medicine

## 2022-08-18 LAB — 5 HIAA, QUANTITATIVE, URINE, 24 HOUR: 5-HIAA, Ur: 1.8 mg/L

## 2022-08-18 NOTE — Telephone Encounter (Signed)
Message sent to patient to let her know we are looking into it per you message to Terri

## 2022-08-28 ENCOUNTER — Encounter: Payer: Self-pay | Admitting: Urology

## 2022-08-28 ENCOUNTER — Ambulatory Visit (INDEPENDENT_AMBULATORY_CARE_PROVIDER_SITE_OTHER): Payer: BC Managed Care – PPO | Admitting: Urology

## 2022-08-28 VITALS — BP 129/85 | HR 69

## 2022-08-28 DIAGNOSIS — N3946 Mixed incontinence: Secondary | ICD-10-CM | POA: Diagnosis not present

## 2022-08-28 MED ORDER — OXYBUTYNIN CHLORIDE ER 10 MG PO TB24: 10.0000 mg | ORAL_TABLET | Freq: Every day | ORAL | 3 refills | Status: DC

## 2022-08-28 NOTE — Addendum Note (Signed)
Addended by: Consuella Lose on: 08/28/2022 04:27 PM   Modules accepted: Orders

## 2022-08-28 NOTE — Progress Notes (Signed)
08/28/2022 11:29 AM   Kemiyah E Slutsky 1975-10-27 [id number]  Referring provider: Eustaquio Boyden, MD [address] [place],  [place] [address]  Chief Complaint  Patient presents with   Follow-up   Over Active Bladder    HPI: I was consulted to assist the patient is urinary incontinence worsening over many years.  She leaks with coughing sneezing bending lifting.  She leaks with intercourse.  She does not have urge incontinence.  She states she leaks all the time.  She wears 1 or 2 pads a day.  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  She has mild bedwetting.   She states she leaks a small amount when she goes from a sitting to standing position.  She leaks during intercourse but does not feel urgency and it does not necessarily with thrusting.     She voids every 2 hours and has good flow.  No nocturia.     No hysterectomy   On pelvic examination patient had grade 2 hypermobility the bladder neck and negative cough test.  She had a high small grade 2 cystocele.  It would not descend with a cough.  She could cause some rotational descent when she would bear down.  There would be no risk of hinge effect.  It did not reach the urethrovesical angle   Patient has mixed incontinence.  She may have an element of an overactive bladder because of the bedwetting.  Having said that it does sound primarily a stress incontinence and she can leak getting up from a chair.     On urodynamics she did not void for 3 hours and was catheterized for 125 mL.  Maximum bladder capacity was 900 mL.  Bladder was hyposensitive.  She had low pressure overactivity reaching a pressure of 6 cm of water.  She did not leak.  At 500 mL she had moderate stress incontinence with leak point pressure 94 cm of water.  Her cough leak point pressure was 86 cm of water with mild leakage.  It was difficult for her to void in the lab setting.  She generated a voluntary contraction at 12 cm of  water.  She voided 50 mL with a maximum flow of 54 mils per second.  Maximum voiding pressure 12 cm water.  Residual 50 mL.  Bladder neck descended 2 to 3 cm.  She did not realize that she was straining to void.  She does have a small moderate cystocele.    The patient has a mixed picture and I will treat her overactive bladder first with behavioral medical therapy especially with her having mild bedwetting.  Based on urodynamics and her voiding she appears to be at higher risk of retention and a urethral injectable may be the best option for her.  Persistent overactive bladder symptoms and leakage with intercourse could persist with the stress incontinence procedure.  The role of physical therapy was emphasized   Patient understands her mixed picture.  She understands that the leaking with intercourse is probably from overactivity but it is less clear.  She said this is less bothersome but the leakage when she moves and gets out of a chair it does bother her the most.  She held off on physical therapy.  I gave her oxybutynin ER 10 mg prescription and Myrbetriq 50 mg samples and prescription and reevaluate in 8 weeks.  If she still leaking I will discuss her large capacity poorly  contractile bladder and the role of a sling but especially the role of a urethral bulking agent.  I mentioned her large bladder to her today   The patient was seen approximately 16 months ago. When the patient was taking Myrbetriq for a year she had much less frequency.  She less incontinence.  Still is not leaking with intercourse.  Several months ago her insurance would not pay for it.  She has had imipramine years ago.    Vesicare 5 mg 3x11.  Oxybutynin ER 10 mg 3x11.  Reassess in 8 weeks.  Call insurance company before she returns if they fail to look into step therapy   After the patient left I noted her urine she had red blood cells in the urine and we will check this again next visit.  I did send urine for culture     Today I have not seen the patient since July 2022.  He is doing much better on oxybutynin.  No infections.  She is lost 30 pounds.  Her gynecologist that her bladder has dropped some and she is leaking more with walking now that she is more active.  She is considering surgery or another procedure in the future but not currently.  Clinically not infected.  Frequency stable    PMH: Past Medical History:  Diagnosis Date   Allergic rhinitis    seasonal   Anxiety    Depression    GDM (gestational diabetes mellitus)    GERD (gastroesophageal reflux disease)    Hypothyroidism    medically treated, likely initially thyroiditis (heterogeneous gland on [place] 05/2015)   Insomnia    Low bladder compliance    [facility] (Dr. Achilles Dunk)  stress incontinence, on imipramine   Obesity    Prediabetes    A1c 5.8% 11/2010   Urinary incontinence    Vulvar burning     Surgical History: Past Surgical History:  Procedure Laterality Date   BREAST SURGERY  06/1994   Reconstruction   CHOLECYSTECTOMY     ECTOPIC PREGNANCY SURGERY  10/2008   Tube remained   REDUCTION MAMMAPLASTY Left 1996   thyroid US  06/2010   heterogeneous normal sized thyroid gland consistent with chronic thyroiditis    Home Medications:  Allergies as of 08/28/2022       Reactions   Erythromycin    REACTION: GI upset, rash, SOB   Metformin And Related Other (See Comments)   GI upset   Penicillins    REACTION: as child, ok with amoxicillin in past        Medication List        Accurate as of August 28, 2022 11:29 AM. If you have any questions, ask your nurse or doctor.          DULoxetine HCl 40 MG Cpep Take 40 mg by mouth daily.   ELDERBERRY PO Take by mouth daily.   hydrochlorothiazide 25 MG tablet Commonly known as: HYDRODIURIL TAKE 1 TABLET (25 MG TOTAL) BY MOUTH DAILY AS NEEDED (LEG SWELLING).   levonorgestrel 20 MCG/24HR IUD Commonly known as: MIRENA 1 each by Intrauterine route once.   levothyroxine  125 MCG tablet Commonly known as: Synthroid TAKE 1 TABLET BY MOUTH EVERY DAY BEFORE BREAKFAST   loratadine 10 MG tablet Commonly known as: CLARITIN Take 10 mg by mouth daily.   metoprolol tartrate 25 MG tablet Commonly known as: LOPRESSOR Take 25 mg by mouth daily.   multivitamin tablet Take 1 tablet by mouth daily.   omeprazole  20 MG capsule Commonly known as: PRILOSEC TAKE 1 CAPSULE BY MOUTH EVERY DAY   ondansetron 4 MG tablet Commonly known as: Zofran Take 1 tablet (4 mg total) by mouth every 8 (eight) hours as needed for nausea or vomiting.   oxybutynin 10 MG 24 hr tablet Commonly known as: DITROPAN-XL Take 1 tablet (10 mg total) by mouth daily.   Ozempic (0.25 or 0.5 MG/DOSE) 2 MG/3ML Sopn Generic drug: Semaglutide(0.25 or 0.5MG /DOS) Inject 0.25 mg into the skin once a week.   SUMAtriptan 100 MG tablet Commonly known as: IMITREX TAKE 1 TABLET BY MOUTH ONCE AS NEEDED FOR MIGRAINE   TURMERIC CURCUMIN PO Take by mouth daily.   Vitamin B 12 500 MCG Tabs Take 500 mcg by mouth daily.   VITAMIN C PO Take by mouth daily.   Vitamin D 50 MCG (2000 UT) Caps Take 1 capsule (2,000 Units total) by mouth daily.   Zinc 100 MG Tabs Take 1 tablet (100 mg total) by mouth daily.        Allergies:  Allergies  Allergen Reactions   Erythromycin     REACTION: GI upset, rash, SOB   Metformin And Related Other (See Comments)    GI upset   Penicillins     REACTION: as child, ok with amoxicillin in past    Family History: Family History  Problem Relation Age of Onset   Cancer Mother        leiomyosarcoma of abdomen   Hypertension Father    Heart disease Father        Porcine Valve Replacement   Cancer Maternal Grandmother        Ovarian cancer   Cancer Cousin        Thyroid cancer   Diabetes Neg Hx    Stroke Neg Hx    Breast cancer Neg Hx     Social History:  reports that she has never smoked. She has never been exposed to tobacco smoke. She has never used  smokeless tobacco. She reports that she does not currently use alcohol. She reports that she does not use drugs.  ROS:                                        Physical Exam: BP 129/85   Pulse 69   Constitutional:  Alert and oriented, No acute distress.   Laboratory Data: Lab Results  Component Value Date   WBC 15.8 (H) 08/04/2022   HGB 15.2 08/04/2022   HCT 45.1 08/04/2022   MCV 79 08/04/2022   PLT 372 08/04/2022    Lab Results  Component Value Date   CREATININE 1.03 (H) 08/04/2022    No results found for: "PSA"  No results found for: "TESTOSTERONE"  Lab Results  Component Value Date   HGBA1C 6.7 (H) 04/10/2022    Urinalysis    Component Value Date/Time   COLORURINE YELLOW 10/24/2008 0231   APPEARANCEUR Cloudy (A) 10/12/2020 0858   LABSPEC >1.030 (H) 10/24/2008 0231   PHURINE 5.5 10/24/2008 0231   GLUCOSEU Negative 10/12/2020 0858   HGBUR LARGE (A) 10/24/2008 0231   HGBUR negative 03/09/2008 1604   BILIRUBINUR Negative 10/12/2020 0858   KETONESUR 15 (A) 10/24/2008 0231   PROTEINUR Negative 10/12/2020 0858   PROTEINUR NEGATIVE 10/24/2008 0231   UROBILINOGEN 0.2 07/23/2017 1606   UROBILINOGEN 0.2 10/24/2008 0231   NITRITE Negative 10/12/2020 0858   NITRITE  NEGATIVE 10/24/2008 0231   LEUKOCYTESUR 1+ (A) 10/12/2020 0858    Pertinent Imaging:   Assessment & Plan: Oxybutynin 90 x 3 sent to pharmacy.  See in 1 year or when necessary  1. Mixed incontinence  - Urinalysis, Complete   No follow-ups on file.  Martina Sinner, MD  [facility] [address], [address] [place], [place] [address] [phone]

## 2022-09-06 ENCOUNTER — Other Ambulatory Visit: Payer: Self-pay

## 2022-09-06 DIAGNOSIS — N393 Stress incontinence (female) (male): Secondary | ICD-10-CM

## 2022-09-06 DIAGNOSIS — N3946 Mixed incontinence: Secondary | ICD-10-CM

## 2022-09-09 ENCOUNTER — Other Ambulatory Visit: Payer: Self-pay | Admitting: Family Medicine

## 2022-09-09 ENCOUNTER — Other Ambulatory Visit: Payer: Self-pay | Admitting: Physician Assistant

## 2022-09-09 DIAGNOSIS — F331 Major depressive disorder, recurrent, moderate: Secondary | ICD-10-CM

## 2022-09-15 LAB — SPECIMEN STATUS REPORT

## 2022-09-15 LAB — 5 HIAA, QUANTITATIVE, URINE, 24 HOUR
5-HIAA, Ur: 2.8 mg/L
5-HIAA,Quant.,24 Hr Urine: 7 mg/24 hr (ref 0.0–14.9)

## 2022-10-02 ENCOUNTER — Ambulatory Visit: Payer: BC Managed Care – PPO | Admitting: Urology

## 2022-10-05 ENCOUNTER — Encounter: Payer: Self-pay | Admitting: Family Medicine

## 2022-12-10 ENCOUNTER — Other Ambulatory Visit: Payer: Self-pay | Admitting: Family Medicine

## 2022-12-13 NOTE — Telephone Encounter (Signed)
Last office visit 08/04/2022 for Abd pain, vomiting and diarrhea.  Last refilled 08/03/2022 for #20 with no refills.  At 03/29/2022 Visit patient was told to stop Metroprolol but is still on medication list. Next Appt: No future appointments with PCP.  Refill?

## 2022-12-15 NOTE — Telephone Encounter (Signed)
ERx 

## 2023-01-08 ENCOUNTER — Ambulatory Visit: Payer: BC Managed Care – PPO | Admitting: Nurse Practitioner

## 2023-01-08 ENCOUNTER — Encounter: Payer: Self-pay | Admitting: Nurse Practitioner

## 2023-01-08 VITALS — BP 126/86 | HR 70 | Temp 97.6°F | Ht 63.0 in | Wt 246.0 lb

## 2023-01-08 DIAGNOSIS — G4733 Obstructive sleep apnea (adult) (pediatric): Secondary | ICD-10-CM

## 2023-01-08 NOTE — Assessment & Plan Note (Signed)
Severe OSA on CPAP. Excellent compliance and control. Receives benefit from use. Understands proper care/use of device. Aware of safe driving practices. Healthy weight loss encouraged.  Patient Instructions  Continue to use CPAP every night, minimum of 4-6 hours a night.  Change equipment as directed. Wash your tubing with warm soap and water daily, hang to dry. Wash humidifier portion weekly. Use bottled, distilled water and change daily Be aware of reduced alertness and do not drive or operate heavy machinery if experiencing this or drowsiness.  Exercise encouraged, as tolerated. Healthy weight management discussed.  Avoid or decrease alcohol consumption and medications that make you more sleepy, if possible. Notify if persistent daytime sleepiness occurs even with consistent use of PAP therapy.  Follow up in one year with Dr.Kasa or Katie Annaliza Zia,NP, or sooner if needed

## 2023-01-08 NOTE — Progress Notes (Signed)
@Patient  ID: Tamara Shaffer, female    DOB: 09-22-1975, 47 y.o.   MRN: [id number]  Chief Complaint  Patient presents with   Follow-up    Wearing CPAP nightly. No problems with pressure or mask.     Referring provider: Eustaquio Boyden, MD  HPI: 47 year old female, never smoker followed for severe sleep apnea. Past medical history significant for migraines, allergic rhinitis, hypothyroid, DM II, MDD, GAD, HLD, obesity.   TEST/EVENTS:  05/09/2016 HST: AHI 35.3/h, SpO2 low 87%  04/26/2022: OV with Tamara Ordaz NP for sleep consult, referred by Dr. Sharen Shaffer.  She has been on CPAP for around 5 years now.  Prior to using CPAP, she had trouble with sleeping and was always tired.  Since she has been on CPAP, she feels like she is getting good benefit from use.  Wakes feeling rested in the morning.  Sleeping well most nights.  Does have some occasional fatigue but overall feels much improved.  Denies any residual snoring.  She does not have any issues with sleep parasomnia/paralysis.  No history of narcolepsy or cataplexy.  She would like to get a new CPAP machine if able to.  She does think she is coming due for new one. She goes to bed between 10 PM to midnight.  Falls asleep within 20 to 30 minutes.  Usually wakes once or twice a night to use the restroom.  Officially gets up around 5 AM.  No sleep aids.  She works as a Runner, broadcasting/film/video.  Does not operate any heavy machinery in her job Animal nutritionist.  Up 10 pounds over the last 2 years.  Last sleep study was in 2018. She has a history of migraines, diabetes.  No history of stroke.  She is never smoker.  Does not drink any alcohol.  Drinks 1 to 2 cups of coffee a day.  Second grade teacher.  Lives at home with her husband and 2 kids.  Family history of mother with leiomyosarcoma. [facility] 9 03/27/2022-04/25/2022: CPAP 5-15 cmH2O 30/30 days; 100% >4hr; average usage 8 hours 11 minutes Pressure 95th 14.4 Leaks 95th 7.2 AHI 1.1  01/08/2023: Today-follow-up Patient presents  today for follow-up.  She received a new CPAP after her last visit.  She sleeps with her CPAP nightly.  Feels well rested when she wakes up in the mornings.  Without her CPAP, she has a lot of daytime sleepiness and restless sleep at night.  She wears a fullface mask.  She has no concerns or complaints today.  Denies any issues with drowsy driving or morning headaches.  12/06/2022-01/04/2023:  CPAP 5-15 cmH2O 30/30 days; 100% >4 hr; average use 7 hr 22 min Pressure 95th 14 Leaks 95th 2.7 AHI 1.2  Allergies  Allergen Reactions   Erythromycin     REACTION: GI upset, rash, SOB   Metformin And Related Other (See Comments)    GI upset   Penicillins     REACTION: as child, ok with amoxicillin in past    Immunization History  Administered Date(s) Administered   Influenza Inj Mdck Quad Pf 02/01/2018   Influenza Split 05/22/2011, 01/04/2023   Influenza Whole 12/18/2007, 01/13/2010   Influenza,inj,Quad PF,6+ Mos 01/13/2013, 02/04/2014, 02/04/2015   Influenza-Unspecified 02/08/2021   Moderna Sars-Covid-2 Vaccination 03/26/2020, 05/12/2020    Past Medical History:  Diagnosis Date   Allergic rhinitis    seasonal   Anxiety    Depression    GDM (gestational diabetes mellitus)    GERD (gastroesophageal reflux disease)    Hypothyroidism  medically treated, likely initially thyroiditis (heterogeneous gland on [place] 05/2015)   Insomnia    Low bladder compliance    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     Cholecalciferol (VITAMIN D) 2000 units CAPS Take 1 capsule (2,000 Units total) by mouth  daily. 30 capsule    cyanocobalamin 500 MCG TABS Take 500 mcg by mouth daily.     DULoxetine HCl 40 MG CPEP TAKE 1 CAPSULE BY MOUTH EVERY DAY 90 capsule 1   ELDERBERRY PO Take by mouth daily.     hydrochlorothiazide (HYDRODIURIL) 25 MG tablet TAKE 1 TABLET (25 MG TOTAL) BY MOUTH DAILY AS NEEDED (LEG SWELLING). 90 tablet 1   levonorgestrel (MIRENA) 20 MCG/24HR IUD 1 each by Intrauterine route once.     levothyroxine (SYNTHROID) 125 MCG tablet TAKE 1 TABLET BY MOUTH EVERY DAY BEFORE BREAKFAST 90 tablet 4   loratadine (CLARITIN) 10 MG tablet Take 10 mg by mouth daily.     Multiple Vitamin (MULTIVITAMIN) tablet Take 1 tablet by mouth daily.     omeprazole (PRILOSEC) 20 MG capsule TAKE 1 CAPSULE BY MOUTH EVERY DAY 90 capsule 1   oxybutynin (DITROPAN-XL) 10 MG 24 hr tablet Take 1 tablet (10 mg total) by mouth daily. 90 tablet 3   SUMAtriptan (IMITREX) 100 MG tablet TAKE 1 TABLET BY MOUTH ONCE AS NEEDED FOR MIGRAINE 27 tablet 4   TURMERIC CURCUMIN PO Take by mouth daily.     Zinc 100 MG TABS Take 1 tablet (100 mg total) by mouth daily.  0   DULoxetine HCl 40 MG CPEP TAKE 40 MG BY MOUTH DAILY. (Patient not taking: Reported on 01/08/2023) 90 capsule 1   DULoxetine HCl 40 MG CPEP TAKE 40 MG BY MOUTH DAILY. (Patient not taking: Reported on 01/08/2023) 90 capsule 1   metoprolol tartrate (LOPRESSOR) 25 MG tablet TAKE 1 TABLET (25 MG TOTAL) BY MOUTH DAILY. (Patient not taking: Reported on 01/08/2023) 90 tablet 0   ondansetron (ZOFRAN) 4 MG tablet TAKE 1 TABLET BY MOUTH EVERY 8 HOURS AS NEEDED FOR NAUSEA AND VOMITING (Patient not taking: Reported on 01/08/2023) 18 tablet 1   diclofenac (VOLTAREN) EC tablet 75 mg      No facility-administered medications prior to visit.     Review of Systems:   Constitutional: No weight loss or gain, night sweats, fevers, chills, or lassitude. + Occasional daytime fatigue HEENT: No headaches, difficulty swallowing, tooth/dental problems, or sore throat. No sneezing,  itching, ear ache, nasal congestion, or post nasal drip CV:  No chest pain, orthopnea, PND, swelling in lower extremities, anasarca, dizziness, palpitations, syncope Resp: No shortness of breath with exertion or at rest. No excess mucus or change in color of mucus. No productive or non-productive. No hemoptysis. No wheezing.  No chest wall deformity GI:  + Occasional heartburn, indigestion. No abdominal pain, nausea, vomiting, diarrhea, change in bowel habits, loss of appetite, bloody stools.  GU: No dysuria, change in color of urine, urgency or frequency.   Skin: No rash, lesions, ulcerations MSK:  No joint  pain or swelling.   Neuro: No dizziness or lightheadedness.  Psych: No depression or anxiety. Mood stable.     Physical Exam:  BP 126/86 (BP Location: Left Arm, Patient Position: Sitting, Cuff Size: Large)   Pulse 70   Temp 97.6 F (36.4 C) (Temporal)   Ht 5\' 3"  (1.6 m)   Wt 246 lb (111.6 kg)   SpO2 96%   BMI 43.58 kg/m   GEN: Pleasant, interactive, well-appearing obese; in no acute distress. HEENT:  Normocephalic and atraumatic. PERRLA. Sclera white. Nasal turbinates pink, moist and patent bilaterally. No rhinorrhea present. Oropharynx pink and moist, without exudate or edema. No lesions, ulcerations, or postnasal drip. Mallampati III NECK:  Supple w/ fair ROM. No JVD present. Normal carotid impulses w/o bruits. Thyroid symmetrical with no goiter or nodules palpated. No lymphadenopathy.   CV: RRR, no m/r/g, no peripheral edema. Pulses intact, +2 bilaterally. No cyanosis, pallor or clubbing. PULMONARY:  Unlabored, regular breathing. Clear bilaterally A&P w/o wheezes/rales/rhonchi. No accessory muscle use.  GI: BS present and normoactive. Soft, non-tender to palpation. No organomegaly or masses detected.  MSK: No erythema, warmth or tenderness. Cap refil <2 sec all extrem. No deformities or joint swelling noted.  Neuro: A/Ox3. No focal deficits noted.   Skin: Warm, no lesions or  rashe Psych: Normal affect and behavior. Judgement and thought content appropriate.     Lab Results:  CBC    Component Value Date/Time   WBC 15.8 (H) 08/04/2022 1325   WBC 13.1 (H) 09/13/2020 1635   RBC 5.68 (H) 08/04/2022 1325   RBC 4.75 09/13/2020 1635   HGB 15.2 08/04/2022 1325   HCT 45.1 08/04/2022 1325   HCT 36 07/14/2010 0000   PLT 372 08/04/2022 1325   MCV 79 08/04/2022 1325   MCV 74.0 07/14/2010 0000   MCH 26.8 08/04/2022 1325   MCH 26.4 06/06/2013 1706   MCHC 33.7 08/04/2022 1325   MCHC 32.4 09/13/2020 1635   RDW 14.2 08/04/2022 1325   LYMPHSABS 3.8 (H) 08/04/2022 1325   MONOABS 0.7 09/13/2020 1635   EOSABS 0.6 (H) 08/04/2022 1325   BASOSABS 0.0 08/04/2022 1325    BMET    Component Value Date/Time   NA 136 08/04/2022 1325   K 3.7 08/04/2022 1325   CL 97 08/04/2022 1325   CO2 22 08/04/2022 1325   GLUCOSE 118 (H) 08/04/2022 1325   GLUCOSE 74 09/13/2020 1635   BUN 12 08/04/2022 1325   CREATININE 1.03 (H) 08/04/2022 1325   CREATININE 0.81 06/06/2013 1706   CALCIUM 9.9 08/04/2022 1325   GFRNONAA 78 09/27/2018 1330   GFRAA 90 09/27/2018 1330    BNP No results found for: "BNP"   Imaging:  No results found.  Administration History     None           No data to display          No results found for: "NITRICOXIDE"      Assessment & Plan:   Severe obstructive sleep apnea Severe OSA on CPAP. Excellent compliance and control. Receives benefit from use. Understands proper care/use of device. Aware of safe driving practices. Healthy weight loss encouraged.  Patient Instructions  Continue to use CPAP every night, minimum of 4-6 hours a night.  Change equipment as directed. Wash your tubing with warm soap and water daily, hang to dry. Wash humidifier portion weekly. Use bottled, distilled water and change daily Be aware of reduced alertness and do not drive or operate heavy machinery if  experiencing this or drowsiness.  Exercise encouraged,  as tolerated. Healthy weight management discussed.  Avoid or decrease alcohol consumption and medications that make you more sleepy, if possible. Notify if persistent daytime sleepiness occurs even with consistent use of PAP therapy.  Follow up in one year with Dr.Kasa or Katie Hodaya Curto,NP, or sooner if needed    Severe obesity (BMI >= 40) (HCC) BMI 43. See above    I spent 25 minutes of dedicated to the care of this patient on the date of this encounter to include pre-visit review of records, face-to-face time with the patient discussing conditions above, post visit ordering of testing, clinical documentation with the electronic health record, making appropriate referrals as documented, and communicating necessary findings to members of the patients care team.  Noemi Chapel, NP 01/08/2023  Pt aware and understands NP's role.

## 2023-01-08 NOTE — Assessment & Plan Note (Signed)
BMI 43. See above

## 2023-01-08 NOTE — Patient Instructions (Addendum)
Continue to use CPAP every night, minimum of 4-6 hours a night.  Change equipment as directed. Wash your tubing with warm soap and water daily, hang to dry. Wash humidifier portion weekly. Use bottled, distilled water and change daily Be aware of reduced alertness and do not drive or operate heavy machinery if experiencing this or drowsiness.  Exercise encouraged, as tolerated. Healthy weight management discussed.  Avoid or decrease alcohol consumption and medications that make you more sleepy, if possible. Notify if persistent daytime sleepiness occurs even with consistent use of PAP therapy.  Follow up in one year with Dr.Kasa or Katie Kaedan Richert,NP, or sooner if needed

## 2023-01-09 ENCOUNTER — Encounter: Payer: Self-pay | Admitting: Family Medicine

## 2023-01-17 ENCOUNTER — Ambulatory Visit: Payer: BC Managed Care – PPO | Admitting: Family Medicine

## 2023-01-17 ENCOUNTER — Encounter: Payer: Self-pay | Admitting: Family Medicine

## 2023-01-17 VITALS — BP 148/77 | HR 65 | Wt 242.2 lb

## 2023-01-17 DIAGNOSIS — N920 Excessive and frequent menstruation with regular cycle: Secondary | ICD-10-CM

## 2023-01-17 NOTE — Progress Notes (Signed)
   Subjective:    Patient ID: Tamara Shaffer is a 47 y.o. female presenting with IUD Check   on 01/17/2023  HPI: Has had Mirena in since 10/2015. She had a heavy cycle last month. Had significant cramping with this. Another cycle 2 weeks later. Has had some bladder prolapse. Last TSH was 01.24 and was not normal. None since.  Review of Systems  Constitutional:  Negative for chills and fever.  Respiratory:  Negative for shortness of breath.   Cardiovascular:  Negative for chest pain.  Gastrointestinal:  Negative for abdominal pain, nausea and vomiting.  Genitourinary:  Negative for dysuria.  Skin:  Negative for rash.      Objective:    BP (!) 148/77   Pulse 65   Wt 242 lb 3.2 oz (109.9 kg)   BMI 42.90 kg/m  Physical Exam Exam conducted with a chaperone present.  Constitutional:      General: She is not in acute distress.    Appearance: She is well-developed.  HENT:     Head: Normocephalic and atraumatic.  Eyes:     General: No scleral icterus. Cardiovascular:     Rate and Rhythm: Normal rate.  Pulmonary:     Effort: Pulmonary effort is normal.  Abdominal:     Palpations: Abdomen is soft.  Genitourinary:    Comments: BUS normal, vagina is pink and rugated, cervix is parous without lesion, IUD strings noted Musculoskeletal:     Cervical back: Neck supple.  Skin:    General: Skin is warm and dry.  Neurological:     Mental Status: She is alert and oriented to person, place, and time.         Assessment & Plan:   Problem List Items Addressed This Visit       Unprioritized   Menorrhagia - Primary    Suspect thyroid may be off. Nearing end of Mirena cycle. Did not want a replacement at this time. May be interested in hysterectomy with bladder repair at some point. Check labs. Continue Mirena      Relevant Orders   TSH   T4, free   T3, free    Return in about 6 weeks (around 02/28/2023) for a follow-up.  Reva Bores, MD 01/17/2023 3:43 PM

## 2023-01-17 NOTE — Progress Notes (Signed)
CC: IUD check  Periods are becoming bad  12/15/22- period  And then again 01/05/23  And then BP has been high, super tired

## 2023-01-17 NOTE — Assessment & Plan Note (Signed)
Suspect thyroid may be off. Nearing end of Mirena cycle. Did not want a replacement at this time. May be interested in hysterectomy with bladder repair at some point. Check labs. Continue Mirena

## 2023-01-18 LAB — T3, FREE: T3, Free: 2.8 pg/mL (ref 2.0–4.4)

## 2023-01-18 LAB — TSH: TSH: 2.45 u[IU]/mL (ref 0.450–4.500)

## 2023-01-18 LAB — T4, FREE: Free T4: 1.17 ng/dL (ref 0.82–1.77)

## 2023-03-06 ENCOUNTER — Other Ambulatory Visit: Payer: Self-pay | Admitting: Family Medicine

## 2023-03-06 DIAGNOSIS — M7989 Other specified soft tissue disorders: Secondary | ICD-10-CM

## 2023-03-06 NOTE — Telephone Encounter (Signed)
Hydrochlorothiazide Last filled:  12/10/22, #90 Last OV:  08/04/22, abd pain, nausea/vomiting Next OV:  none

## 2023-03-07 ENCOUNTER — Other Ambulatory Visit: Payer: Self-pay | Admitting: Family Medicine

## 2023-03-07 ENCOUNTER — Other Ambulatory Visit: Payer: Self-pay | Admitting: Physician Assistant

## 2023-03-07 DIAGNOSIS — F331 Major depressive disorder, recurrent, moderate: Secondary | ICD-10-CM

## 2023-03-11 ENCOUNTER — Other Ambulatory Visit: Payer: Self-pay | Admitting: Family Medicine

## 2023-03-12 NOTE — Telephone Encounter (Signed)
Metoprolol stopped by [facility] 01/08/23  Last filled:  12/15/22, #90 Last OV:  08/04/22, abd pain, N/V/D Next OV:  none

## 2023-03-12 NOTE — Telephone Encounter (Signed)
Please check with patient - is she still taking this as it looks like it was stopped by pulm 12/2022 when pt said she was no longer taking.

## 2023-03-13 NOTE — Telephone Encounter (Signed)
Spoke pt asking about metoprolol rx. States she restarted it due to panic attacks and anxiety causing elevated BP for past 1-2 wks. She spoke with school nurse about, who then suggested pt restart metoprolol. I offered GAD f/u OV. Pt accepted and scheduled OV on 03/26/22 at 8:00.

## 2023-03-27 ENCOUNTER — Ambulatory Visit: Payer: BC Managed Care – PPO | Admitting: Family Medicine

## 2023-04-04 ENCOUNTER — Other Ambulatory Visit: Payer: Self-pay

## 2023-04-04 DIAGNOSIS — F331 Major depressive disorder, recurrent, moderate: Secondary | ICD-10-CM

## 2023-04-04 MED ORDER — DULOXETINE HCL 40 MG PO CPEP: 40.0000 mg | ORAL_CAPSULE | Freq: Every day | ORAL | 1 refills | Status: AC

## 2023-04-06 ENCOUNTER — Encounter: Payer: Self-pay | Admitting: Family Medicine

## 2023-04-06 ENCOUNTER — Ambulatory Visit (INDEPENDENT_AMBULATORY_CARE_PROVIDER_SITE_OTHER): Payer: 59 | Admitting: Family Medicine

## 2023-04-06 VITALS — BP 134/82 | HR 57 | Temp 98.2°F | Ht 63.0 in | Wt 250.4 lb

## 2023-04-06 DIAGNOSIS — F331 Major depressive disorder, recurrent, moderate: Secondary | ICD-10-CM

## 2023-04-06 DIAGNOSIS — R41840 Attention and concentration deficit: Secondary | ICD-10-CM | POA: Insufficient documentation

## 2023-04-06 DIAGNOSIS — F411 Generalized anxiety disorder: Secondary | ICD-10-CM

## 2023-04-06 MED ORDER — BUPROPION HCL ER (SR) 100 MG PO TB12: 100.0000 mg | ORAL_TABLET | Freq: Every day | ORAL | 6 refills | Status: DC

## 2023-04-06 MED ORDER — METOPROLOL TARTRATE 25 MG PO TABS: 25.0000 mg | ORAL_TABLET | Freq: Every day | ORAL | 3 refills | Status: AC

## 2023-04-06 NOTE — Progress Notes (Signed)
Ph: [phone] Fax: [phone]   Patient ID: Tamara Shaffer, female    DOB: [birth year], 48 y.o.   MRN: [id number]  This visit was conducted in person.  BP 134/82   Pulse (!) 57   Temp 98.2 F (36.8 C) (Oral)   Ht 5\' 3"  (1.6 m)   Wt 250 lb 6 oz (113.6 kg)   SpO2 99%   BMI 44.35 kg/m    CC: discuss BP, panic attack Subjective:   HPI: Tamara Shaffer is a 48 y.o. female presenting on 04/06/2023 for Medical Management of Chronic Issues (Here for panic attack and elevated BP. )   Known GAD, MDD previously on effexor s/p difficult wean off onto cymbalta 40mg  daily, this has previously been filled by GYN. She is also on metoprolol 25mg  daily.   With holidays and recent life stressors she's recently had worsened anxiety. 2 yr anniversary of mother's death. Having mini panic attacks, BP was running higher than normal. Has not recently seen therapist.   She tried husband's Strattera for 1 month - felt the best she had in a long time.  Had also run out of Cymbalta for the past month at same time as Strattera.  Cymbalta recently restarted 2 days ago - now doing better.  Previous trial of wellbutrin - did well.  No SI/HI.   Pt endorses persistent pattern of inattention and or hyperactivity/impulsivity that interferes with daily functioning. Symptoms present in 2 or more settings (work > home). Symptoms present before 48 years of age? no Inattention (6+, 6 months): Careless mistakes? yes Difficulty sustaining attention? yes Doesn't listen when spoken to directly? no Lacks follow through with instructions or difficulty completing tasks? yes Difficulty organizing tasks/activities? no Avoiding tasks that require sustained attention? yes Easily losing things needed for tasks? yes Easily distracted by external stimuli? yes Forgetful in daily activities? yes Hyperactive/impulsive (6+, 6 months): Fidgeting, tapping hands/feet? yes Leaves seat when expected to remain in seat?  yes Feeling restless, or running around when not expected? yes Unable to engage in leisurely activities quietly? yes Always on the go, "driven by a motor"? yes Excessive talking? yes Blurting out answer, responds to other's questions? yes Difficulty waiting in line or waiting turn? yes Interrupting or intruding on others? no   Severe OSA on nightly CPAP followed by [facility] pulm.      Relevant past medical, surgical, family and social history reviewed and updated as indicated. Interim medical history since our last visit reviewed. Allergies and medications reviewed and updated. Outpatient Medications Prior to Visit  Medication Sig Dispense Refill   Ascorbic Acid (VITAMIN C PO) Take by mouth daily.     Cholecalciferol (VITAMIN D) 2000 units CAPS Take 1 capsule (2,000 Units total) by mouth daily. 30 capsule    cyanocobalamin 500 MCG TABS Take 500 mcg by mouth daily.     DULoxetine HCl 40 MG CPEP Take 1 capsule (40 mg total) by mouth daily. 90 capsule 1   ELDERBERRY PO Take by mouth daily.     hydrochlorothiazide (HYDRODIURIL) 25 MG tablet TAKE 1 TABLET (25 MG TOTAL) BY MOUTH DAILY AS NEEDED (LEG SWELLING). 90 tablet 1   levonorgestrel (MIRENA) 20 MCG/24HR IUD 1 each by Intrauterine route once.     levothyroxine (SYNTHROID) 125 MCG tablet TAKE 1 TABLET BY MOUTH EVERY DAY BEFORE BREAKFAST 90 tablet 4   loratadine (CLARITIN) 10 MG tablet Take 10 mg by mouth daily.     Multiple Vitamin (MULTIVITAMIN) tablet Take 1  tablet by mouth daily.     omeprazole (PRILOSEC) 20 MG capsule TAKE 1 CAPSULE BY MOUTH EVERY DAY 90 capsule 1   oxybutynin (DITROPAN-XL) 10 MG 24 hr tablet Take 1 tablet (10 mg total) by mouth daily. 90 tablet 3   SUMAtriptan (IMITREX) 100 MG tablet TAKE 1 TABLET BY MOUTH ONCE AS NEEDED FOR MIGRAINE 27 tablet 4   TURMERIC CURCUMIN PO Take by mouth daily.     Zinc 100 MG TABS Take 1 tablet (100 mg total) by mouth daily.  0   metoprolol tartrate (LOPRESSOR) 25 MG tablet TAKE 1 TABLET (25  MG TOTAL) BY MOUTH DAILY. 90 tablet 0   No facility-administered medications prior to visit.     Per HPI unless specifically indicated in ROS section below Review of Systems  Objective:  BP 134/82   Pulse (!) 57   Temp 98.2 F (36.8 C) (Oral)   Ht 5\' 3"  (1.6 m)   Wt 250 lb 6 oz (113.6 kg)   SpO2 99%   BMI 44.35 kg/m   Wt Readings from Last 3 Encounters:  04/06/23 250 lb 6 oz (113.6 kg)  01/17/23 242 lb 3.2 oz (109.9 kg)  01/08/23 246 lb (111.6 kg)      Physical Exam Vitals and nursing note reviewed.  Constitutional:      Appearance: Normal appearance. She is not ill-appearing.  HENT:     Mouth/Throat:     Mouth: Mucous membranes are moist.     Pharynx: Oropharynx is clear. No oropharyngeal exudate or posterior oropharyngeal erythema.  Eyes:     Extraocular Movements: Extraocular movements intact.     Pupils: Pupils are equal, round, and reactive to light.  Neck:     Thyroid: No thyroid mass or thyromegaly.  Cardiovascular:     Rate and Rhythm: Normal rate and regular rhythm.     Pulses: Normal pulses.     Heart sounds: Normal heart sounds. No murmur heard. Pulmonary:     Effort: Pulmonary effort is normal. No respiratory distress.     Breath sounds: Normal breath sounds. No wheezing, rhonchi or rales.  Musculoskeletal:     Right lower leg: No edema.     Left lower leg: No edema.  Skin:    General: Skin is warm and dry.     Findings: No rash.  Neurological:     Mental Status: She is alert.  Psychiatric:        Mood and Affect: Mood normal.        Behavior: Behavior normal.       Results for orders placed or performed in visit on 01/17/23  TSH   Collection Time: 01/17/23  4:21 PM  Result Value Ref Range   TSH 2.450 0.450 - 4.500 uIU/mL  T4, free   Collection Time: 01/17/23  4:21 PM  Result Value Ref Range   Free T4 1.17 0.82 - 1.77 ng/dL  T3, free   Collection Time: 01/17/23  4:21 PM  Result Value Ref Range   T3, Free 2.8 2.0 - 4.4 pg/mL     Assessment & Plan:   Problem List Items Addressed This Visit     MDD (major depressive disorder), recurrent episode, moderate (HCC)   Doing better now that she's back on Cymbalta.       Relevant Medications   buPROPion ER (WELLBUTRIN SR) 100 MG 12 hr tablet   GAD (generalized anxiety disorder)   Doing better back on Cymbalta + metoprolol.  Relevant Medications   buPROPion ER (WELLBUTRIN SR) 100 MG 12 hr tablet   Inattention - Primary   Screens positive for ADHD.  Responded very well to Strattera throughout the month of December.  Discussed treatment options including stimulant vs nonstimulant. Offered psychology referral for formal ADHD testing if stimulant route desired - she will consider.  Discussed atomoxetine (Strattera) vs wellbutrin as well as mechanism of action of medications. As she's already on Cymbalta through GYN, will not start Strattera. Start Wellbutrin SR 100mg  daily in am, advised to monitor pulse as it can increase effect of metoprolol - anticipate she will tolerate well. Rx #30 RF 6. Update with effect.         Meds ordered this encounter  Medications   buPROPion ER (WELLBUTRIN SR) 100 MG 12 hr tablet    Sig: Take 1 tablet (100 mg total) by mouth daily.    Dispense:  30 tablet    Refill:  6   metoprolol tartrate (LOPRESSOR) 25 MG tablet    Sig: Take 1 tablet (25 mg total) by mouth daily.    Dispense:  90 tablet    Refill:  3    No orders of the defined types were placed in this encounter.   Patient Instructions  You do screen positive for ADHD - let's start wellbutrin SR 100mg  daily in the morning. Monitor heart rate as it can increase effect of metoprolol. Goal pulse 60-80 Let us know how you do with the wellbutrin, we may have option of switching to Strattera if needed  Follow up plan: No follow-ups on file.  Eustaquio Boyden, MD

## 2023-04-06 NOTE — Patient Instructions (Signed)
You do screen positive for ADHD - let's start wellbutrin SR 100mg  daily in the morning. Monitor heart rate as it can increase effect of metoprolol. Goal pulse 60-80 Let us know how you do with the wellbutrin, we may have option of switching to Strattera if needed

## 2023-04-06 NOTE — Assessment & Plan Note (Signed)
Doing better back on Cymbalta + metoprolol.

## 2023-04-06 NOTE — Assessment & Plan Note (Addendum)
Screens positive for ADHD.  Responded very well to Strattera throughout the month of December.  Discussed treatment options including stimulant vs nonstimulant. Offered psychology referral for formal ADHD testing if stimulant route desired - she will consider.  Discussed atomoxetine (Strattera) vs wellbutrin as well as mechanism of action of medications. As she's already on Cymbalta through GYN, will not start Strattera. Start Wellbutrin SR 100mg  daily in am, advised to monitor pulse as it can increase effect of metoprolol - anticipate she will tolerate well. Rx #30 RF 6. Update with effect.

## 2023-04-06 NOTE — Assessment & Plan Note (Signed)
Doing better now that she's back on Cymbalta.

## 2023-05-02 ENCOUNTER — Other Ambulatory Visit: Payer: Self-pay | Admitting: Family Medicine

## 2023-05-02 NOTE — Telephone Encounter (Signed)
Message from pharmacy:  REQUEST FOR 90 DAYS PRESCRIPTION   Last filled:  04/06/23, #30 Last OV:  04/06/23, inattention Next OV:  none

## 2023-05-04 NOTE — Telephone Encounter (Signed)
ERx

## 2023-05-31 ENCOUNTER — Other Ambulatory Visit: Payer: Self-pay | Admitting: Family Medicine

## 2023-05-31 DIAGNOSIS — E039 Hypothyroidism, unspecified: Secondary | ICD-10-CM

## 2023-05-31 NOTE — Telephone Encounter (Signed)
 ERx

## 2023-05-31 NOTE — Telephone Encounter (Signed)
 Levothyroxine Last filled:  03/04/23, #90 Last OV:  04/06/23, inattention Next OV:  none

## 2023-06-12 ENCOUNTER — Encounter (HOSPITAL_COMMUNITY): Payer: Self-pay | Admitting: Emergency Medicine

## 2023-06-12 ENCOUNTER — Other Ambulatory Visit: Payer: Self-pay

## 2023-06-12 ENCOUNTER — Emergency Department (HOSPITAL_COMMUNITY)
Admission: EM | Admit: 2023-06-12 | Discharge: 2023-06-13 | Disposition: A | Discharge status: Home / Self Care | Attending: Emergency Medicine | Admitting: Emergency Medicine

## 2023-06-12 DIAGNOSIS — R1031 Right lower quadrant pain: Secondary | ICD-10-CM | POA: Diagnosis present

## 2023-06-12 DIAGNOSIS — Z79890 Hormone replacement therapy (postmenopausal): Secondary | ICD-10-CM | POA: Diagnosis not present

## 2023-06-12 DIAGNOSIS — D72829 Elevated white blood cell count, unspecified: Secondary | ICD-10-CM | POA: Insufficient documentation

## 2023-06-12 DIAGNOSIS — E039 Hypothyroidism, unspecified: Secondary | ICD-10-CM | POA: Diagnosis not present

## 2023-06-12 LAB — URINALYSIS, ROUTINE W REFLEX MICROSCOPIC
Bacteria, UA: NONE SEEN
Bilirubin Urine: NEGATIVE
Glucose, UA: NEGATIVE mg/dL
Hgb urine dipstick: NEGATIVE
Ketones, ur: NEGATIVE mg/dL
Nitrite: NEGATIVE
Protein, ur: NEGATIVE mg/dL
Specific Gravity, Urine: 1.018 (ref 1.005–1.030)
pH: 5 (ref 5.0–8.0)

## 2023-06-12 LAB — CBC
HCT: 40.8 % (ref 36.0–46.0)
Hemoglobin: 13.1 g/dL (ref 12.0–15.0)
MCH: 26.4 pg (ref 26.0–34.0)
MCHC: 32.1 g/dL (ref 30.0–36.0)
MCV: 82.1 fL (ref 80.0–100.0)
Platelets: 296 10*3/uL (ref 150–400)
RBC: 4.97 MIL/uL (ref 3.87–5.11)
RDW: 14 % (ref 11.5–15.5)
WBC: 15.6 10*3/uL — ABNORMAL HIGH (ref 4.0–10.5)
nRBC: 0 % (ref 0.0–0.2)

## 2023-06-12 NOTE — ED Triage Notes (Signed)
 Pt in with sharp, worsening pain to RLQ. Pt states she woke up with this pain early this morning, denies any n/v/d. Area tender on palpation, LMP 3/26 roughly. 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

## 2023-06-13 ENCOUNTER — Emergency Department (HOSPITAL_COMMUNITY)

## 2023-06-13 LAB — COMPREHENSIVE METABOLIC PANEL
ALT: 25 U/L (ref 0–44)
AST: 21 U/L (ref 15–41)
Albumin: 3.6 g/dL (ref 3.5–5.0)
Alkaline Phosphatase: 66 U/L (ref 38–126)
Anion gap: 11 (ref 5–15)
BUN: 15 mg/dL (ref 6–20)
CO2: 25 mmol/L (ref 22–32)
Calcium: 9.2 mg/dL (ref 8.9–10.3)
Chloride: 98 mmol/L (ref 98–111)
Creatinine, Ser: 0.92 mg/dL (ref 0.44–1.00)
GFR, Estimated: >60 mL/min (ref >60)
Glucose, Bld: 147 mg/dL — ABNORMAL HIGH (ref 70–99)
Potassium: 3.4 mmol/L — ABNORMAL LOW (ref 3.5–5.1)
Sodium: 134 mmol/L — ABNORMAL LOW (ref 135–145)
Total Bilirubin: 0.5 mg/dL (ref 0.0–1.2)
Total Protein: 7.5 g/dL (ref 6.5–8.1)

## 2023-06-13 LAB — HCG, SERUM, QUALITATIVE: Preg, Serum: NEGATIVE

## 2023-06-13 LAB — LIPASE, BLOOD: Lipase: 28 U/L (ref 11–51)

## 2023-06-13 MED ORDER — IOHEXOL 350 MG/ML SOLN
75.0000 mL | Freq: Once | INTRAVENOUS | Status: AC | PRN
  Administered 2023-06-13: 75 mL via INTRAVENOUS

## 2023-06-13 NOTE — Discharge Instructions (Signed)
 You were seen today for abdominal pain.  Your workup today is reassuring including CT imaging.  If your pain persists, worsens, you develop nausea, vomiting, or fevers, you should be reevaluated.  Occasionally early appendicitis cannot be detected on CT imaging.

## 2023-06-13 NOTE — ED Provider Notes (Signed)
 [place] EMERGENCY DEPARTMENT AT [facility] Provider Note   CSN: [id number] Arrival date & time: 06/12/23  2301     History  Chief Complaint  Patient presents with   Abdominal Pain    Tamara Shaffer is a 48 y.o. female.  HPI     This is a 48 year old female who presents with right lower quadrant pain.  Patient reports that she woke up yesterday morning with right lower quadrant pain.  It persisted throughout the day and worsened.  She has not had any nausea, vomiting, anorexia, change in bowel movements.  No fevers.  Denies urinary symptoms or hematuria.  History of cholecystectomy but still has her appendix.  Home Medications Prior to Admission medications   Medication Sig Start Date End Date Taking? Authorizing Provider  Ascorbic Acid (VITAMIN C PO) Take by mouth daily.    [provider]  buPROPion ER [facility] SR) 100 MG 12 hr tablet TAKE 1 TABLET BY MOUTH EVERY DAY 05/04/23   Eustaquio Boyden, MD  Cholecalciferol (VITAMIN D) 2000 units CAPS Take 1 capsule (2,000 Units total) by mouth daily. 07/07/17   Eustaquio Boyden, MD  cyanocobalamin 500 MCG TABS Take 500 mcg by mouth daily. 06/25/19   Eustaquio Boyden, MD  DULoxetine HCl 40 MG CPEP Take 1 capsule (40 mg total) by mouth daily. 04/04/23   Reva Bores, MD  ELDERBERRY PO Take by mouth daily.    [provider]  hydrochlorothiazide (HYDRODIURIL) 25 MG tablet TAKE 1 TABLET (25 MG TOTAL) BY MOUTH DAILY AS NEEDED (LEG SWELLING). 03/07/23   Eustaquio Boyden, MD  levonorgestrel (MIRENA) 20 MCG/24HR IUD 1 each by Intrauterine route once.    [provider]  levothyroxine (SYNTHROID) 125 MCG tablet TAKE 1 TABLET BY MOUTH EVERY DAY BEFORE BREAKFAST 05/31/23   Eustaquio Boyden, MD  loratadine (CLARITIN) 10 MG tablet Take 10 mg by mouth daily.    [provider]  metoprolol tartrate (LOPRESSOR) 25 MG tablet Take 1 tablet (25 mg total) by mouth daily. 04/06/23   Eustaquio Boyden, MD   Multiple Vitamin (MULTIVITAMIN) tablet Take 1 tablet by mouth daily.    [provider]  omeprazole (PRILOSEC) 20 MG capsule TAKE 1 CAPSULE BY MOUTH EVERY DAY 03/09/23   Glyn Ade, Scot Jun, PA-C  oxybutynin (DITROPAN-XL) 10 MG 24 hr tablet Take 1 tablet (10 mg total) by mouth daily. 08/28/22   Alfredo Martinez, MD  SUMAtriptan (IMITREX) 100 MG tablet TAKE 1 TABLET BY MOUTH ONCE AS NEEDED FOR MIGRAINE 10/31/19   Glyn Ade, Scot Jun, PA-C  TURMERIC CURCUMIN PO Take by mouth daily.    [provider]  Zinc 100 MG TABS Take 1 tablet (100 mg total) by mouth daily. 03/29/22   Eustaquio Boyden, MD      Allergies    Erythromycin, Metformin and related, and Penicillins    Review of Systems   Review of Systems  Constitutional:  Negative for fever.  Respiratory:  Negative for shortness of breath.   Cardiovascular:  Negative for chest pain.  Gastrointestinal:  Positive for abdominal pain. Negative for diarrhea, nausea and vomiting.  Genitourinary:  Negative for dysuria.  All other systems reviewed and are negative.   Physical Exam Updated Vital Signs BP 135/79   Pulse 78   Temp 98.4 F (36.9 C)   Resp 19   Wt 108.9 kg   LMP 05/16/2023 (Approximate)   SpO2 100%   BMI 42.51 kg/m  Physical Exam Vitals and nursing note reviewed.  Constitutional:      Appearance: She is well-developed. She is obese. She is not ill-appearing.  HENT:     Head: Normocephalic and atraumatic.  Eyes:     Pupils: Pupils are equal, round, and reactive to light.  Cardiovascular:     Rate and Rhythm: Normal rate and regular rhythm.     Heart sounds: Normal heart sounds.  Pulmonary:     Effort: Pulmonary effort is normal. No respiratory distress.     Breath sounds: No wheezing.  Abdominal:     General: Bowel sounds are normal.     Palpations: Abdomen is soft.     Tenderness: There is abdominal tenderness in the right lower quadrant. There is no guarding or rebound.  Musculoskeletal:      Cervical back: Neck supple.  Skin:    General: Skin is warm and dry.  Neurological:     Mental Status: She is alert and oriented to person, place, and time.     ED Results / Procedures / Treatments   Labs (all labs ordered are listed, but only abnormal results are displayed) Labs Reviewed  COMPREHENSIVE METABOLIC PANEL - Abnormal; Notable for the following components:      Result Value   Sodium 134 (*)    Potassium 3.4 (*)    Glucose, Bld 147 (*)    All other components within normal limits  CBC - Abnormal; Notable for the following components:   WBC 15.6 (*)    All other components within normal limits  URINALYSIS, ROUTINE W REFLEX MICROSCOPIC - Abnormal; Notable for the following components:   Leukocytes,Ua TRACE (*)    All other components within normal limits  LIPASE, BLOOD  HCG, SERUM, QUALITATIVE    EKG None  Radiology CT ABDOMEN PELVIS W CONTRAST Result Date: 06/13/2023 CLINICAL DATA:  Right lower quadrant pain for several hours, initial encounter EXAM: CT ABDOMEN AND PELVIS WITH CONTRAST TECHNIQUE: Multidetector CT imaging of the abdomen and pelvis was performed using the standard protocol following bolus administration of intravenous contrast. RADIATION DOSE REDUCTION: This exam was performed according to the departmental dose-optimization program which includes automated exposure control, adjustment of the mA and/or kV according to patient size and/or use of iterative reconstruction technique. CONTRAST:  75mL OMNIPAQUE IOHEXOL 350 MG/ML SOLN COMPARISON:  None Available. FINDINGS: Lower chest: No acute abnormality. Hepatobiliary: Fatty infiltration of the liver is noted. An area of focal enhancement is noted within the right lobe of the liver most consistent with focal hemangioma. The gallbladder has been surgically removed. Pancreas: Unremarkable. No pancreatic ductal dilatation or surrounding inflammatory changes. Spleen: Normal in size without focal abnormality.  Adrenals/Urinary Tract: Adrenal glands are within normal limits. Kidneys demonstrate a normal enhancement pattern. No renal calculi or obstructive changes are seen. Ureters are within normal limits. Bladder is partially distended. Stomach/Bowel: No obstructive or inflammatory changes of the colon are seen. Diverticular change is noted. The appendix is within normal limits. Small bowel and stomach are unremarkable. Vascular/Lymphatic: No significant vascular findings are present. No enlarged abdominal or pelvic lymph nodes. Reproductive: Uterus and bilateral adnexa are unremarkable. IUD is noted in place. Other: No abdominal wall hernia or abnormality. No abdominopelvic ascites. Musculoskeletal: No acute or significant osseous findings. IMPRESSION: Fatty liver. Focal right hepatic hemangioma. Diverticulosis without diverticulitis. No other focal abnormality is noted. Electronically Signed   By: Alcide Clever M.D.   On: 06/13/2023 02:42    Procedures Procedures    Medications Ordered in ED Medications  iohexol (OMNIPAQUE) 350  MG/ML injection 75 mL (75 mLs Intravenous Contrast Given 06/13/23 0237)    ED Course/ Medical Decision Making/ A&P                                 Medical Decision Making Amount and/or Complexity of Data Reviewed Labs: ordered. Radiology: ordered.  Risk Prescription drug management.   This patient presents to the ED for concern of abdominal pain, this involves an extensive number of treatment options, and is a complaint that carries with it a high risk of complications and morbidity.  I considered the following differential and admission for this acute, potentially life threatening condition.  The differential diagnosis includes appendicitis, colitis, ovarian pathology, kidney stones, UTI  MDM:    This is a 48 year old female who presents with lower abdominal pain.  She is nontoxic and vital signs are reassuring.  No real other associated symptoms.  She has some mild  tenderness to palpation on exam without signs of peritonitis.  Labs obtained.  Slight leukocytosis.  CMP and lipase are normal.  She is not pregnant.  CT imaging does not reveal any evidence of appendicitis.  No large ovarian cysts noted.  Discussed with the patient that pelvic ultrasound is more sensitive for ovarian pathology although I would have low suspicion for torsion at this time without any large obvious cyst.  Patient is agreeable to deferring this at this time.  She thinks she may have just pulled a muscle.  Discussed with the patient and her husband that if this was really early appendicitis, it may not show up on CT imaging.  She was given strict return precautions. (Labs, imaging, consults)  Labs: I Ordered, and personally interpreted labs.  The pertinent results include: CBC, CMP, lipase, hCG, urinalysis  Imaging Studies ordered: I ordered imaging studies including CT I independently visualized and interpreted imaging. I agree with the radiologist interpretation  Additional history obtained from chart review.  External records from outside source obtained and reviewed including prior evaluations  Cardiac Monitoring: The patient was maintained on a cardiac monitor.  If on the cardiac monitor, I personally viewed and interpreted the cardiac monitored which showed an underlying rhythm of: Sinus  Reevaluation: After the interventions noted above, I reevaluated the patient and found that they have :stayed the same  Social Determinants of Health:  lives independently  Disposition: Discharge  Co morbidities that complicate the patient evaluation  Past Medical History:  Diagnosis Date   Allergic rhinitis    seasonal   Anxiety    Depression    GDM (gestational diabetes mellitus)    GERD (gastroesophageal reflux disease)    Hypothyroidism    medically treated, likely initially thyroiditis (heterogeneous gland on [place] 05/2015)   Insomnia    Low bladder compliance    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, on imipramine   Obesity    Prediabetes    A1c 5.8% 11/2010   Urinary incontinence    Vulvar burning      Medicines Meds ordered this encounter  Medications   iohexol (OMNIPAQUE) 350 MG/ML injection 75 mL    I have reviewed the patients home medicines and have made adjustments as needed  Problem List / ED Course: Problem List Items Addressed This Visit   None Visit Diagnoses       Right lower quadrant abdominal pain    -  Primary  Final Clinical Impression(s) / ED Diagnoses Final diagnoses:  Right lower quadrant abdominal pain    Rx / DC Orders ED Discharge Orders     None         Carma Dwiggins, Mayer Masker, MD 06/13/23 [phone]

## 2023-08-27 ENCOUNTER — Ambulatory Visit: Payer: Self-pay | Admitting: Urology

## 2023-08-31 ENCOUNTER — Other Ambulatory Visit: Payer: Self-pay | Admitting: Physician Assistant

## 2023-08-31 ENCOUNTER — Other Ambulatory Visit: Payer: Self-pay | Admitting: Family Medicine

## 2023-08-31 DIAGNOSIS — M7989 Other specified soft tissue disorders: Secondary | ICD-10-CM

## 2023-08-31 NOTE — Telephone Encounter (Signed)
 Hydrochlorothiazide  Last filled:  05/31/23, #90 Last OV:  04/06/23, panic attack; elevated BP Next OV:  none

## 2023-09-04 ENCOUNTER — Telehealth: Payer: Self-pay | Admitting: Urology

## 2023-09-04 ENCOUNTER — Other Ambulatory Visit: Payer: Self-pay

## 2023-09-04 MED ORDER — OXYBUTYNIN CHLORIDE ER 10 MG PO TB24: 10.0000 mg | ORAL_TABLET | Freq: Every day | ORAL | 0 refills | Status: DC

## 2023-09-04 NOTE — Telephone Encounter (Signed)
 LVM informing pt we refilled the medication

## 2023-09-04 NOTE — Telephone Encounter (Signed)
 Pt is requesting a medication refill for Oxybutynin .

## 2023-09-30 ENCOUNTER — Other Ambulatory Visit: Payer: Self-pay | Admitting: Family Medicine

## 2023-09-30 DIAGNOSIS — F331 Major depressive disorder, recurrent, moderate: Secondary | ICD-10-CM

## 2023-10-01 DIAGNOSIS — F331 Major depressive disorder, recurrent, moderate: Secondary | ICD-10-CM

## 2023-10-01 MED ORDER — DULOXETINE HCL 40 MG PO CPEP: 1.0000 | ORAL_CAPSULE | Freq: Every day | ORAL | 3 refills | Status: DC

## 2023-10-01 NOTE — Telephone Encounter (Signed)
-----   Message from [facility] Latoya L sent at 10/01/2023 10:32 AM EDT ----- Regarding: Refill Pt called requesting refill on cymbalta  only has 3 days left.   Please advise.

## 2023-10-02 ENCOUNTER — Telehealth: Payer: Self-pay | Admitting: Family Medicine

## 2023-10-02 NOTE — Telephone Encounter (Signed)
-----   Message from [facility] Latoya L sent at 10/01/2023 10:32 AM EDT ----- Regarding: Refill Pt called requesting refill on cymbalta  only has 3 days left.   Please advise.

## 2023-11-03 ENCOUNTER — Other Ambulatory Visit: Payer: Self-pay | Admitting: Family Medicine

## 2023-11-05 NOTE — Telephone Encounter (Signed)
 Please advise if patient needs to be seen.  No follow up on file Last seen 04/06/23 for MDD 08/04/22 f/u DM

## 2023-11-07 NOTE — Telephone Encounter (Signed)
 ERx

## 2023-11-26 ENCOUNTER — Ambulatory Visit: Admitting: Urology

## 2023-11-26 VITALS — BP 117/80 | HR 58 | Ht 63.0 in | Wt 240.0 lb

## 2023-11-26 DIAGNOSIS — N3946 Mixed incontinence: Secondary | ICD-10-CM

## 2023-11-26 LAB — URINALYSIS, COMPLETE
Bilirubin, UA: NEGATIVE
Glucose, UA: NEGATIVE
Ketones, UA: NEGATIVE
Leukocytes,UA: NEGATIVE
Nitrite, UA: NEGATIVE
Protein,UA: NEGATIVE
Specific Gravity, UA: 1.025 (ref 1.005–1.030)
Urobilinogen, Ur: 0.2 mg/dL (ref 0.2–1.0)
pH, UA: 6 (ref 5.0–7.5)

## 2023-11-26 LAB — MICROSCOPIC EXAMINATION: Epithelial Cells (non renal): >10 /HPF — AB (ref 0–10)

## 2023-11-26 MED ORDER — OXYBUTYNIN CHLORIDE ER 10 MG PO TB24: 10.0000 mg | ORAL_TABLET | Freq: Every day | ORAL | 3 refills | Status: AC

## 2023-11-26 NOTE — Progress Notes (Signed)
 11/26/2023 8:53 AM   Jammi E Wigfall 1975-04-10 [id number]  Referring provider: Rilla Baller, MD [address] [place],  [place] [address]  Chief Complaint  Patient presents with   Follow-up   Urinary Incontinence    HPI: I was consulted to assist the patient is urinary incontinence worsening over many years.  She leaks with coughing sneezing bending lifting.  She leaks with intercourse.  She does not have urge incontinence.  She states she leaks all the time.  She wears 1 or 2 pads a day.  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  She has mild bedwetting.   She states she leaks a small amount when she goes from a sitting to standing position.  She leaks during intercourse but does not feel urgency and it does not necessarily with thrusting.     She voids every 2 hours and has good flow.  No nocturia.     No hysterectomy   On pelvic examination patient had grade 2 hypermobility the bladder neck and negative cough test.  She had a high small grade 2 cystocele.  It would not descend with a cough.  She could cause some rotational descent when she would bear down.  There would be no risk of hinge effect.  It did not reach the urethrovesical angle   Patient has mixed incontinence.  She may have an element of an overactive bladder because of the bedwetting.  Having said that it does sound primarily a stress incontinence and she can leak getting up from a chair.     On urodynamics she did not void for 3 hours and was catheterized for 125 mL.  Maximum bladder capacity was 900 mL.  Bladder was hyposensitive.  She had low pressure overactivity reaching a pressure of 6 cm of water.  She did not leak.  At 500 mL she had moderate stress incontinence with leak point pressure 94 cm of water.  Her cough leak point pressure was 86 cm of water with mild leakage.  It was difficult for her to void in the lab setting.  She generated a voluntary contraction at 12 cm of  water.  She voided 50 mL with a maximum flow of 54 mils per second.  Maximum voiding pressure 12 cm water.  Residual 50 mL.  Bladder neck descended 2 to 3 cm.  She did not realize that she was straining to void.  She does have a small moderate cystocele.    The patient has a mixed picture and I will treat her overactive bladder first with behavioral medical therapy especially with her having mild bedwetting.  Based on urodynamics and her voiding she appears to be at higher risk of retention and a urethral injectable may be the best option for her.  Persistent overactive bladder symptoms and leakage with intercourse could persist with the stress incontinence procedure.  The role of physical therapy was emphasized   Patient understands her mixed picture.  She understands that the leaking with intercourse is probably from overactivity but it is less clear.  She said this is less bothersome but the leakage when she moves and gets out of a chair it does bother her the most.  She held off on physical therapy.  I gave her oxybutynin  ER 10 mg prescription and Myrbetriq  50 mg samples and prescription and reevaluate in 8 weeks.  If she still leaking I will discuss her large capacity poorly contractile  bladder and the role of a sling but especially the role of a urethral bulking agent.  I mentioned her large bladder to her today   The patient was seen approximately 16 months ago. When the patient was taking Myrbetriq  for a year she had much less frequency.  She less incontinence.  Still is not leaking with intercourse.  Several months ago her insurance would not pay for it.  She has had imipramine years ago.     Vesicare  5 mg 3x11.  Oxybutynin  ER 10 mg 3x11.  Reassess in 8 weeks.  Call insurance company before she returns if they fail to look into step therapy   After the patient left I noted her urine she had red blood cells in the urine and we will check this again next visit.  I did send urine for culture      Today I have not seen the patient since July 2022.  He is doing much better on oxybutynin .  No infections.  She is lost 30 pounds.  Her gynecologist that her bladder has dropped some and she is leaking more with walking now that she is more active.  She is considering surgery or another procedure in the future but not currently.  Clinically not infected.  Frequency stable.  See in 1 year on oxybutynin   Today Patient has urge incontinence much better on oxybutynin .  When she forgets her medication she can tell the difference.  She still leaks with coughing sneezing bending and now along with dancing.  Her and Dr. Fredirick in [place] may perform a hysterectomy and they will discuss it in December.  She wants a bladder sling at the same time.  She understands that if Dr. Fredirick does not do a sling then Dr. 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, on imipramine   Obesity    Prediabetes    A1c 5.8% 11/2010   Urinary incontinence    Vulvar burning     Surgical History: Past Surgical History:  Procedure Laterality Date   BREAST SURGERY  06/1994   Reconstruction   CHOLECYSTECTOMY     ECTOPIC PREGNANCY SURGERY  10/2008   Tube remained   REDUCTION MAMMAPLASTY Left 1996   thyroid  us   06/2010   heterogeneous normal sized thyroid  gland consistent with chronic thyroiditis    Home Medications:  Allergies as of 11/26/2023       Reactions   Erythromycin    REACTION: GI upset, rash, SOB   Metformin And Related Other (See Comments)   GI upset   Penicillins    REACTION: as child, ok with amoxicillin  in  past        Medication List        Accurate as of November 26, 2023  8:53 AM. If you have any questions, ask your nurse or doctor.          buPROPion  ER 100 MG 12 hr tablet Commonly known as: WELLBUTRIN  SR TAKE 1 TABLET BY MOUTH EVERY DAY   DULoxetine  HCl 40 MG Cpep  Take 1 capsule (40 mg total) by mouth daily.   DULoxetine  HCl 40 MG Cpep Take 1 capsule (40 mg total) by mouth daily.   ELDERBERRY PO Take by mouth daily.   hydrochlorothiazide  25 MG tablet Commonly known as: HYDRODIURIL  TAKE 1 TABLET (25 MG TOTAL) BY MOUTH DAILY AS NEEDED (LEG SWELLING).   levonorgestrel  20 MCG/24HR IUD Commonly known as: MIRENA  1 each by Intrauterine route once.   levothyroxine  125 MCG tablet Commonly known as: SYNTHROID  TAKE 1 TABLET BY MOUTH EVERY DAY BEFORE BREAKFAST   loratadine 10 MG tablet Commonly known as: CLARITIN Take 10 mg by mouth daily.   metoprolol  tartrate 25 MG tablet Commonly known as: LOPRESSOR  Take 1 tablet (25 mg total) by mouth daily.   multivitamin tablet Take 1 tablet by mouth daily.   omeprazole  20 MG capsule Commonly known as: PRILOSEC TAKE 1 CAPSULE BY MOUTH EVERY DAY   oxybutynin  10 MG 24 hr tablet Commonly known as: DITROPAN -XL Take 1 tablet (10 mg total) by mouth daily.   SUMAtriptan  100 MG tablet Commonly known as: IMITREX  TAKE 1 TABLET BY MOUTH ONCE AS NEEDED FOR MIGRAINE   TURMERIC CURCUMIN PO Take by mouth daily.   Vitamin B 12 500 MCG Tabs Take 500 mcg by mouth daily.   VITAMIN C PO Take by mouth daily.   Vitamin D  50 MCG (2000 UT) Caps Take 1 capsule (2,000 Units total) by mouth daily.   Zinc  100 MG Tabs Take 1 tablet (100 mg total) by mouth daily.        Allergies:  Allergies  Allergen Reactions   Erythromycin     REACTION: GI upset, rash, SOB   Metformin And Related Other (See Comments)    GI upset   Penicillins     REACTION: as child, ok with amoxicillin  in past    Family History: Family History  Problem  Relation Age of Onset   Cancer Mother        leiomyosarcoma of abdomen   Hypertension Father    Heart disease Father        Porcine Valve Replacement   Cancer Maternal Grandmother        Ovarian cancer   Cancer Cousin        Thyroid  cancer   Diabetes Neg Hx    Stroke Neg Hx    Breast cancer Neg Hx     Social History:  reports that she has never smoked. She has never been exposed to tobacco smoke. She has never used smokeless tobacco. She reports that she does not currently use alcohol. She reports that she does not use drugs.  ROS:                                        Physical Exam: There were no vitals taken for this visit.  Constitutional:  Alert and oriented, No acute distress. HEENT: [place] AT, moist mucus membranes.  Trachea midline, no masses.   Laboratory Data: Lab Results  Component Value Date   WBC 15.6 (H) 06/12/2023   HGB 13.1 06/12/2023   HCT 40.8 06/12/2023   MCV 82.1 06/12/2023   PLT 296 06/12/2023    Lab Results  Component Value Date   CREATININE 0.92 06/12/2023    No results found for: PSA  No results found for: TESTOSTERONE  Lab Results  Component Value Date   HGBA1C 6.7 (H) 04/10/2022  Urinalysis    Component Value Date/Time   COLORURINE YELLOW 06/12/2023 2334   APPEARANCEUR CLEAR 06/12/2023 2334   APPEARANCEUR Cloudy (A) 10/12/2020 0858   LABSPEC 1.018 06/12/2023 2334   PHURINE 5.0 06/12/2023 2334   GLUCOSEU NEGATIVE 06/12/2023 2334   HGBUR NEGATIVE 06/12/2023 2334   HGBUR negative 03/09/2008 1604   BILIRUBINUR NEGATIVE 06/12/2023 2334   BILIRUBINUR Negative 10/12/2020 0858   KETONESUR NEGATIVE 06/12/2023 2334   PROTEINUR NEGATIVE 06/12/2023 2334   UROBILINOGEN 0.2 07/23/2017 1606   UROBILINOGEN 0.2 10/24/2008 0231   NITRITE NEGATIVE 06/12/2023 2334   LEUKOCYTESUR TRACE (A) 06/12/2023 2334    Pertinent Imaging:   Assessment & Plan: Patient has mixed incontinence.  She may be at slightly higher  risk of retention with a sling.  A bulking agent is also a good option although based on body habitus and expectations of sling is a good choice.  I will copy today's note and make certain it is also in [place] if needed in the future.  Otherwise I will see her here in a year.  She has been thinking about surgery for a long time  1. Mixed incontinence (Primary)  - Urinalysis, Complete   No follow-ups on file.  Glendia DELENA Elizabeth, MD  [facility] [address] [place], [place] [address] [phone]

## 2024-02-04 ENCOUNTER — Ambulatory Visit (INDEPENDENT_AMBULATORY_CARE_PROVIDER_SITE_OTHER)
Admission: RE | Admit: 2024-02-04 | Discharge: 2024-02-04 | Disposition: A | Discharge status: Home / Self Care | Source: Ambulatory Visit | Attending: General Practice | Admitting: General Practice

## 2024-02-04 ENCOUNTER — Ambulatory Visit: Admitting: General Practice

## 2024-02-04 ENCOUNTER — Other Ambulatory Visit: Payer: Self-pay

## 2024-02-04 ENCOUNTER — Ambulatory Visit: Payer: Self-pay

## 2024-02-04 ENCOUNTER — Telehealth: Payer: Self-pay

## 2024-02-04 ENCOUNTER — Encounter: Payer: Self-pay | Admitting: General Practice

## 2024-02-04 ENCOUNTER — Emergency Department (HOSPITAL_BASED_OUTPATIENT_CLINIC_OR_DEPARTMENT_OTHER)

## 2024-02-04 ENCOUNTER — Ambulatory Visit: Payer: Self-pay | Admitting: General Practice

## 2024-02-04 ENCOUNTER — Emergency Department (HOSPITAL_BASED_OUTPATIENT_CLINIC_OR_DEPARTMENT_OTHER)
Admission: EM | Admit: 2024-02-04 | Discharge: 2024-02-05 | Disposition: A | Source: Ambulatory Visit | Attending: Emergency Medicine | Admitting: Emergency Medicine

## 2024-02-04 VITALS — BP 120/78 | HR 85 | Temp 101.5°F | Ht 63.0 in | Wt 245.0 lb

## 2024-02-04 DIAGNOSIS — J069 Acute upper respiratory infection, unspecified: Secondary | ICD-10-CM | POA: Diagnosis not present

## 2024-02-04 DIAGNOSIS — R509 Fever, unspecified: Secondary | ICD-10-CM | POA: Diagnosis present

## 2024-02-04 DIAGNOSIS — B9689 Other specified bacterial agents as the cause of diseases classified elsewhere: Secondary | ICD-10-CM | POA: Diagnosis not present

## 2024-02-04 DIAGNOSIS — R0609 Other forms of dyspnea: Secondary | ICD-10-CM

## 2024-02-04 DIAGNOSIS — J189 Pneumonia, unspecified organism: Secondary | ICD-10-CM

## 2024-02-04 LAB — COMPREHENSIVE METABOLIC PANEL WITH GFR
ALT: 29 IU/L (ref 0–32)
AST: 25 IU/L (ref 0–40)
Albumin: 4 g/dL (ref 3.9–4.9)
Alkaline Phosphatase: 98 IU/L (ref 41–116)
BUN/Creatinine Ratio: 10 (ref 9–23)
BUN: 9 mg/dL (ref 6–24)
Bilirubin Total: 0.7 mg/dL (ref 0.0–1.2)
CO2: 24 mmol/L (ref 20–29)
Calcium: 9.2 mg/dL (ref 8.7–10.2)
Chloride: 97 mmol/L (ref 96–106)
Creatinine, Ser: 0.88 mg/dL (ref 0.57–1.00)
Globulin, Total: 3.2 g/dL (ref 1.5–4.5)
Glucose: 131 mg/dL — ABNORMAL HIGH (ref 70–99)
Potassium: 3.9 mmol/L (ref 3.5–5.2)
Sodium: 133 mmol/L — ABNORMAL LOW (ref 134–144)
Total Protein: 7.2 g/dL (ref 6.0–8.5)
eGFR: 82 mL/min/1.73 (ref >59)

## 2024-02-04 LAB — CBC WITH DIFFERENTIAL/PLATELET
Basophils Absolute: 0 x10E3/uL (ref 0.0–0.2)
Basos: 0 %
EOS (ABSOLUTE): 0.1 x10E3/uL (ref 0.0–0.4)
Eos: 0 %
Hematocrit: 40.8 % (ref 34.0–46.6)
Hemoglobin: 13.4 g/dL (ref 11.1–15.9)
Immature Grans (Abs): 0.1 x10E3/uL (ref 0.0–0.1)
Immature Granulocytes: 1 %
Lymphocytes Absolute: 1.4 x10E3/uL (ref 0.7–3.1)
Lymphs: 9 %
MCH: 26.3 pg — ABNORMAL LOW (ref 26.6–33.0)
MCHC: 32.8 g/dL (ref 31.5–35.7)
MCV: 80 fL (ref 79–97)
Monocytes Absolute: 1 x10E3/uL — ABNORMAL HIGH (ref 0.1–0.9)
Monocytes: 6 %
Neutrophils Absolute: 13.6 x10E3/uL — ABNORMAL HIGH (ref 1.4–7.0)
Neutrophils: 84 %
Platelets: 299 x10E3/uL (ref 150–450)
RBC: 5.1 x10E6/uL (ref 3.77–5.28)
RDW: 13.8 % (ref 11.7–15.4)
WBC: 16.2 x10E3/uL — ABNORMAL HIGH (ref 3.4–10.8)

## 2024-02-04 LAB — PREGNANCY, URINE: Preg Test, Ur: NEGATIVE

## 2024-02-04 LAB — POCT INFLUENZA A/B
Influenza A, POC: NEGATIVE
Influenza B, POC: NEGATIVE

## 2024-02-04 LAB — BRAIN NATRIURETIC PEPTIDE: BNP: 20.5 pg/mL (ref 0.0–100.0)

## 2024-02-04 LAB — POC COVID19 BINAXNOW: SARS Coronavirus 2 Ag: NEGATIVE

## 2024-02-04 LAB — D-DIMER, QUANTITATIVE: D-DIMER: 0.85 mg{FEU}/L — ABNORMAL HIGH (ref 0.00–0.49)

## 2024-02-04 MED ORDER — ACETAMINOPHEN 325 MG PO TABS
650.0000 mg | ORAL_TABLET | Freq: Once | ORAL | Status: AC | PRN
  Administered 2024-02-04: 650 mg via ORAL
  Filled 2024-02-04: qty 2

## 2024-02-04 MED ORDER — PROMETHAZINE-DM 6.25-15 MG/5ML PO SYRP: 5.0000 mL | ORAL_SOLUTION | Freq: Four times a day (QID) | ORAL | 0 refills | Status: AC | PRN

## 2024-02-04 MED ORDER — PREDNISONE 20 MG PO TABS: 40.0000 mg | ORAL_TABLET | Freq: Every day | ORAL | 0 refills | Status: AC

## 2024-02-04 MED ORDER — DOXYCYCLINE HYCLATE 100 MG PO TABS: 100.0000 mg | ORAL_TABLET | Freq: Two times a day (BID) | ORAL | 0 refills | Status: AC

## 2024-02-04 MED ORDER — BENZONATATE 200 MG PO CAPS: 200.0000 mg | ORAL_CAPSULE | Freq: Three times a day (TID) | ORAL | 0 refills | Status: AC | PRN

## 2024-02-04 MED ORDER — IOHEXOL 350 MG/ML SOLN
100.0000 mL | Freq: Once | INTRAVENOUS | Status: AC | PRN
  Administered 2024-02-04: 75 mL via INTRAVENOUS

## 2024-02-04 NOTE — Telephone Encounter (Signed)
 Appreciate [place] seeing this pleasant patient.

## 2024-02-04 NOTE — Telephone Encounter (Signed)
 This RN called on call Dr. Joshua to make  him aware of situation.

## 2024-02-04 NOTE — Patient Instructions (Addendum)
 Complete xray(s) prior to leaving today. I will notify you of your results once received. Stop by the lab prior to leaving today. I will notify you of your results once received.   Start Doxycycline  antibiotic for the infection. Take 1 tablet by mouth twice daily for 7 days.   You can try a few things over the counter to help with your symptoms including:  Cough: Delsym or Robitussin (get the off brand, works just as well) Chest Congestion: Mucinex  (plain) Nasal Congestion/Ear Pressure/Sinus Pressure: Try using Flonase  (fluticasone ) nasal spray. Instill 1 spray in each nostril twice daily. This can be purchased over the counter. Body aches, fevers, headache: Ibuprofen (not to exceed 2400 mg in 24 hours) or Acetaminophen-Tylenol (not to exceed 3000 mg in 24 hours) Runny Nose/Throat Drainage/Sneezing/Itchy or Watery Eyes: An antihistamine such as Zyrtec, Claritin, Xyzal, Allegra  As discussed please go to the ER if symptoms worse.   Follow up if symptoms do not improve.   It was a pleasure to see you today!

## 2024-02-04 NOTE — ED Triage Notes (Signed)
 Cough since Friday. Swabs negative. Elevated D-Dimer 0.85 with PCP.

## 2024-02-04 NOTE — Telephone Encounter (Signed)
 FYI Only or Action Required?: appointment scheduled on 02/04/2024  Patient was last seen in primary care on 04/06/2023 by Rilla Baller, MD.  Called Nurse Triage reporting Fever.  Symptoms began Friday and worsening.  Interventions attempted: OTC medications: cold medication without relief.  Symptoms are: gradually worsening.  Triage Disposition: See Physician Within 24 Hours  Patient/caregiver understands and will follow disposition?: Yes    Copied from CRM #[id number]. Topic: Clinical - Red Word Triage >> Feb 04, 2024  8:02 AM Fonda T wrote: [facility] that prompted transfer to Nurse Triage: Patient states she is having fever, around 103.8, chills, body aches, and coughing, that causes head to hurt/pain.  Patient requesting an appointment for evaluation. Reason for Disposition  Fever present > 3 days (72 hours)  Answer Assessment - Initial Assessment Questions 1. TEMPERATURE: What is the most recent temperature?  How was it measured?      102 2. ONSET: When did the fever start?      Friday 3. CHILLS: Do you have chills? If yes: How bad are they?  (e.g., none, mild, moderate, severe)     moderate 4. OTHER SYMPTOMS: Do you have any other symptoms besides the fever?  (e.g., abdomen pain, cough, diarrhea, earache, headache, sore throat, urination pain)     Headache,  5. CAUSE: If there are no symptoms, ask: What do you think is causing the fever?      unsure 6. CONTACTS: Does anyone else in the family have an infection?     no 7. TREATMENT: What have you done so far to treat this fever? (e.g., OTC fever medicines)     Cold medication 8. IMMUNOCOMPROMISE: Do you have any of the following: diabetes, HIV positive, splenectomy, cancer chemotherapy, chronic steroid treatment, transplant patient, etc.?     na 9. PREGNANCY: Is there any chance you are pregnant? When was your last menstrual period?     na 10. TRAVEL: Have you traveled out of the country in  the last month? (e.g., travel history, exposures)       na  Protocols used: [facility]

## 2024-02-04 NOTE — Progress Notes (Signed)
 Established Patient Office Visit  Subjective   Patient ID: Tamara Shaffer, female    DOB: 1975-11-15  Age: 48 y.o. MRN: [id number]  Chief Complaint  Patient presents with   Cough    With fever, headaches, fatigue, body aches, and chills since Friday. Patient taking tylenol and advil for sx.     Cough Associated symptoms include chills, a fever, headaches, shortness of breath and wheezing. Pertinent negatives include no chest pain, ear pain, heartburn or sore throat.    Tamara Shaffer is a 49  year old female, patient of Dr. Rilla, presents today for an acute visit.   Discussed the use of AI scribe software for clinical note transcription with the patient, who gave verbal consent to proceed.  History of Present Illness Tamara Shaffer is a 48 year old female who presents with fever, headache, and respiratory symptoms.  Symptoms began on Friday with fever, headache, fatigue, body aches, and chills. She has been taking Tylenol and Advil as needed. No ear pain, congestion, sinus pain, or sore throat, but she experiences irritation from a dry cough originating from chest congestion, with audible gurgles prompting the cough.  She experiences wheezing and shortness of breath, particularly at night when getting up to use the bathroom, and notes difficulty breathing with her CPAP machine, which she has used for many years. She experiences headaches that intensify with coughing, causing significant pain. She also reports dizziness and difficulty sleeping, waking every twenty minutes due to aches and pains.  No abdominal symptoms, diarrhea, nausea, or urinary symptoms. She has a history of testing negative for conditions despite having them, such as COVID-19.    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 She is up to date on her flu and COVID vaccinations, having received them through the school system.  She denies smoking and has no known allergies except to penicillin-based drugs. Her mother passed away from lung cancer, initially misdiagnosed as pneumonia for three years.   Patient Active Problem List   Diagnosis Date Noted   Inattention 04/06/2023   Abdominal pain, vomiting, and diarrhea 08/04/2022   Severe obstructive sleep apnea 04/28/2022   Type 2 diabetes mellitus with other specified complication (HCC) 04/11/2022   Family history of hemochromatosis 03/29/2022   Dyslipidemia 03/29/2022   Viral keratitis 08/01/2021   Hot flashes 07/04/2017   Menorrhagia 01/28/2016   GAD (generalized anxiety disorder) 02/24/2014   Migraine without aura 12/10/2012   Severe obesity (BMI >= 40) (HCC) 08/06/2012   Anemia 09/02/2010   Fatigue 07/12/2010   Allergic rhinitis 05/27/2010   Hypothyroidism 04/14/2010   STRESS INCONTINENCE 04/14/2010   GERD (gastroesophageal reflux disease) 10/16/2007   MDD (major depressive disorder), recurrent episode, moderate (HCC) 06/05/2007   Past Medical History:  Diagnosis Date   Allergic rhinitis    seasonal   Anxiety    Depression    GDM (gestational diabetes mellitus)    GERD (gastroesophageal reflux disease)    Hypothyroidism    medically treated, likely initially thyroiditis (heterogeneous gland on US  05/2015)   Insomnia    Low bladder compliance    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, on imipramine   Obesity    Prediabetes    A1c 5.8% 11/2010   Urinary incontinence    Vulvar burning    Past Surgical History:  Procedure Laterality Date   BREAST SURGERY  06/1994   Reconstruction  CHOLECYSTECTOMY     ECTOPIC PREGNANCY SURGERY  10/2008   Tube remained   REDUCTION MAMMAPLASTY Left 1996   thyroid  us   06/2010   heterogeneous normal sized thyroid  gland consistent with chronic thyroiditis   Allergies  Allergen Reactions   Erythromycin     REACTION: GI upset, rash, SOB   Metformin And Related Other (See Comments)    GI upset    Penicillins     REACTION: as child, ok with amoxicillin  in past         02/04/2024   10:46 AM 04/06/2023    7:38 AM 08/04/2022    1:57 PM  Depression screen PHQ 2/9  Decreased Interest 0 2 0  Down, Depressed, Hopeless 0 2 1  PHQ - 2 Score 0 4 1  Altered sleeping 1 2 1   Tired, decreased energy 1 3 1   Change in appetite 0 3 0  Feeling bad or failure about yourself  0 2 0  Trouble concentrating 0 2 0  Moving slowly or fidgety/restless 1 0 0  Suicidal thoughts 0 0 0  PHQ-9 Score 3 16  3    Difficult doing work/chores Not difficult at all Not difficult at all Not difficult at all     Data saved with a previous flowsheet row definition       02/04/2024   10:47 AM 04/06/2023    7:38 AM 08/04/2022    1:58 PM 11/21/2019    4:15 PM  GAD 7 : Generalized Anxiety Score  Nervous, Anxious, on Edge 0 2 0 2  Control/stop worrying 0 2 0 2  Worry too much - different things 0 2 0 3  Trouble relaxing 0 2 0 3  Restless 0 1 0 2  Easily annoyed or irritable 0 3 0 3  Afraid - awful might happen 0 0 0 1  Total GAD 7 Score 0 12 0 16  Anxiety Difficulty Not difficult at all Not difficult at all        Review of Systems  Constitutional:  Positive for chills, fever and malaise/fatigue.  HENT:  Negative for congestion, ear pain, sinus pain and sore throat.   Respiratory:  Positive for cough, sputum production, shortness of breath and wheezing.        Chest congestion   Cardiovascular:  Negative for chest pain.  Gastrointestinal:  Negative for abdominal pain, constipation, diarrhea, heartburn, nausea and vomiting.  Genitourinary:  Negative for dysuria, frequency and urgency.  Neurological:  Positive for dizziness and headaches.  Endo/Heme/Allergies:  Negative for polydipsia.  Psychiatric/Behavioral:  Negative for depression and suicidal ideas. The patient is not nervous/anxious.       Objective:     BP 120/78   Pulse 85   Temp (!) 101.5 F (38.6 C) (Oral)   Ht 5' 3 (1.6 m)   Wt 245 lb  (111.1 kg)   SpO2 96%   BMI 43.40 kg/m  BP Readings from Last 3 Encounters:  02/04/24 120/78  11/26/23 117/80  06/13/23 130/73   Wt Readings from Last 3 Encounters:  02/04/24 245 lb (111.1 kg)  11/26/23 240 lb (108.9 kg)  06/12/23 240 lb (108.9 kg)      Physical Exam Vitals and nursing note reviewed.  Constitutional:      Appearance: She is ill-appearing.  Cardiovascular:     Rate and Rhythm: Normal rate and regular rhythm.     Pulses: Normal pulses.     Heart sounds: Normal heart sounds.  Pulmonary:  Effort: Pulmonary effort is normal.     Breath sounds: Normal breath sounds.  Neurological:     Mental Status: She is alert and oriented to person, place, and time.  Psychiatric:        Mood and Affect: Mood normal.        Behavior: Behavior normal.        Thought Content: Thought content normal.        Judgment: Judgment normal.      Results for orders placed or performed in visit on 02/04/24  POCT Influenza A/B  Result Value Ref Range   Influenza A, POC Negative Negative   Influenza B, POC Negative Negative  POC COVID-19  Result Value Ref Range   SARS Coronavirus 2 Ag Negative Negative       The 10-year ASCVD risk score (Arnett DK, et al., 2019) is: 2%    Assessment & Plan:  Dyspnea on exertion -     DG Chest 2 View -     CBC with Differential/Platelet -     Brain natriuretic peptide -     Comprehensive metabolic panel with GFR -     D-dimer, quantitative  Fever, unspecified fever cause -     POCT Influenza A/B -     POC COVID-19 BinaxNow -     CBC with Differential/Platelet -     Brain natriuretic peptide -     Comprehensive metabolic panel with GFR  Bacterial upper respiratory infection -     Doxycycline  Hyclate; Take 1 tablet (100 mg total) by mouth 2 (two) times daily for 7 days.  Dispense: 14 tablet; Refill: 0 -     Promethazine -DM; Take 5 mLs by mouth 4 (four) times daily as needed.  Dispense: 118 mL; Refill: 0 -     Benzonatate ; Take 1  capsule (200 mg total) by mouth 3 (three) times daily as needed.  Dispense: 20 capsule; Refill: 0    Assessment and Plan Assessment & Plan Acute upper respiratory infection with cough, fever, and dyspnea Negative for flu and COVID. Differential includes pneumonia, bronchitis, PE, or bacterial upper respiratory infection.  - Given presentation, - STAT chest x-ray pending.  - Ordered D-dimer test to rule out PE.  - Prescribed doxycycline  100 mg, one tablet twice a day for 7 days with food. - Consider prednisone ; will wait for chest xray results.  - Recommended Delsym or Robitussin for cough. - Recommended Mucinex  for chest congestion. - Advised Flonase  nasal spray if ear pressure or nasal congestion develops. - Continue Tylenol and ibuprofen for fever. - Prescribed promethazine  dexamorphine for nighttime cough, cautioning against daytime use due to drowsiness. - Prescribed Tessalon  Perles for daytime cough. - Advised to seek emergency care if shortness of breath or difficulty breathing worsens. - Provided work note for absence until Wednesday.   Return if symptoms worsen or fail to improve.    Carrol Aurora, NP

## 2024-02-04 NOTE — Telephone Encounter (Signed)
 Noted

## 2024-02-04 NOTE — Telephone Encounter (Signed)
 This RN attempted to contact [facility] with notification they are closed. Will route HP to office.  Copied from CRM #[id number]. Topic: Clinical - Red Word Triage >> Feb 04, 2024  2:07 PM Alfonso HERO wrote: Red Word that prompted transfer to Nurse Triage: [facility] calling with STAT results

## 2024-02-05 ENCOUNTER — Encounter: Payer: Self-pay | Admitting: General Practice

## 2024-02-05 NOTE — Telephone Encounter (Signed)
 Noted. Patient was sent to the ER for CTA.

## 2024-02-05 NOTE — ED Provider Notes (Signed)
 [place] EMERGENCY DEPARTMENT AT [facility]  Provider Note  CSN: [id number] Arrival date & time: 02/04/24 1825  History Chief Complaint  Patient presents with   Shortness of Breath    Tamara Shaffer is a 48 y.o. female with 2-3 days of cough, congestion, fever and SOB. Went to PCP earlier in the day and had labs and CXR done showing a pneumonia. She was prescribed antibiotics but was called this evening because her dimer was mildly elevated, advised to come to the ED for CTA. She has no other PE risk factors.    Home Medications Prior to Admission medications   Medication Sig Start Date End Date Taking? Authorizing Provider  Ascorbic Acid (VITAMIN C PO) Take by mouth daily.    [provider]  benzonatate  (TESSALON ) 200 MG capsule Take 1 capsule (200 mg total) by mouth 3 (three) times daily as needed. 02/04/24   Vincente Shivers, NP  buPROPion  ER (WELLBUTRIN  SR) 100 MG 12 hr tablet TAKE 1 TABLET BY MOUTH EVERY DAY 11/07/23   Rilla Baller, MD  Cholecalciferol (VITAMIN D ) 2000 units CAPS Take 1 capsule (2,000 Units total) by mouth daily. 07/07/17   Rilla Baller, MD  cyanocobalamin 500 MCG TABS Take 500 mcg by mouth daily. 06/25/19   Rilla Baller, MD  doxycycline  (VIBRA -TABS) 100 MG tablet Take 1 tablet (100 mg total) by mouth 2 (two) times daily for 7 days. 02/04/24 02/11/24  Vincente Shivers, NP  DULoxetine  HCl 40 MG CPEP Take 1 capsule (40 mg total) by mouth daily. 04/04/23   Fredirick Glenys RAMAN, MD  DULoxetine  HCl 40 MG CPEP Take 1 capsule (40 mg total) by mouth daily. 10/01/23   Stinson, Jacob J, DO  ELDERBERRY PO Take by mouth daily.    [provider]  hydrochlorothiazide  (HYDRODIURIL ) 25 MG tablet TAKE 1 TABLET (25 MG TOTAL) BY MOUTH DAILY AS NEEDED (LEG SWELLING). 08/31/23   Rilla Baller, MD  levonorgestrel  (MIRENA ) 20 MCG/24HR IUD 1 each by Intrauterine route once.    [provider]  levothyroxine  (SYNTHROID ) 125 MCG tablet TAKE 1  TABLET BY MOUTH EVERY DAY BEFORE BREAKFAST 05/31/23   Rilla Baller, MD  loratadine (CLARITIN) 10 MG tablet Take 10 mg by mouth daily.    [provider]  metoprolol  tartrate (LOPRESSOR ) 25 MG tablet Take 1 tablet (25 mg total) by mouth daily. 04/06/23   Rilla Baller, MD  Multiple Vitamin (MULTIVITAMIN) tablet Take 1 tablet by mouth daily.    [provider]  omeprazole  (PRILOSEC) 20 MG capsule TAKE 1 CAPSULE BY MOUTH EVERY DAY 08/31/23   Nance Gaskins, Darice BRAVO, PA-C  oxybutynin  (DITROPAN -XL) 10 MG 24 hr tablet Take 1 tablet (10 mg total) by mouth daily. 11/26/23   Gaston Hamilton, MD  predniSONE  (DELTASONE ) 20 MG tablet Take 2 tablets (40 mg total) by mouth daily for 5 days. 02/04/24 02/09/24  Vincente Shivers, NP  promethazine -dextromethorphan (PROMETHAZINE -DM) 6.25-15 MG/5ML syrup Take 5 mLs by mouth 4 (four) times daily as needed. 02/04/24   Vincente Shivers, NP  SUMAtriptan  (IMITREX ) 100 MG tablet TAKE 1 TABLET BY MOUTH ONCE AS NEEDED FOR MIGRAINE 10/31/19   Nance Gaskins, Darice BRAVO, PA-C  TURMERIC CURCUMIN PO Take by mouth daily.    [provider]  Zinc  100 MG TABS Take 1 tablet (100 mg total) by mouth daily. 03/29/22   Rilla Baller, MD     Allergies    Erythromycin, Metformin and related, and Penicillins   Review of Systems   Review  of Systems Please see HPI for pertinent positives and negatives  Physical Exam BP 121/81 (BP Location: Right Arm)   Pulse (!) 103   Temp (!) 100.9 F (38.3 C) (Oral)   Resp (!) 21   LMP 01/14/2024 (Exact Date) Comment: Pt denies chance of pregnancy  SpO2 92%   Physical Exam Vitals and nursing note reviewed.  HENT:     Head: Normocephalic.     Nose: Nose normal.  Eyes:     Extraocular Movements: Extraocular movements intact.  Cardiovascular:     Rate and Rhythm: Normal rate.  Pulmonary:     Effort: Pulmonary effort is normal.  Musculoskeletal:        General: Normal range of motion.     Cervical back: Neck  supple.  Skin:    Findings: No rash (on exposed skin).  Neurological:     Mental Status: She is alert and oriented to person, place, and time.  Psychiatric:        Mood and Affect: Mood normal.     ED Results / Procedures / Treatments   EKG None  Procedures Procedures  Medications Ordered in the ED Medications  iohexol  (OMNIPAQUE ) 350 MG/ML injection 100 mL (75 mLs Intravenous Contrast Given 02/04/24 1922)  acetaminophen (TYLENOL) tablet 650 mg (650 mg Oral Given 02/04/24 2140)    Initial Impression and Plan  Patient here with outpatient workup earlier in the day consistent with pneumonia, sent here for CTA due to positive dimer. She was noted to be febrile on arrival, improved with APAP. I personally viewed the images from radiology studies and agree with radiologist interpretation: CTA is neg for large/central PE, contrast timing not ideal for subsegmental areas, confirms PNA. She is feeling well, SpO2 and HR on recheck are normalized. Recommend she begin oral antibiotics, but not to take steroids also prescribed concurrently. She is comfortable going home, PCP follow up, RTED for any other concerns.    ED Course       MDM Rules/Calculators/A&P Medical Decision Making Problems Addressed: Pneumonia due to infectious organism, unspecified laterality, unspecified part of lung: acute illness or injury  Amount and/or Complexity of Data Reviewed Labs: ordered. Decision-making details documented in ED Course. Radiology: ordered and independent interpretation performed. Decision-making details documented in ED Course.  Risk OTC drugs. Prescription drug management.     Final Clinical Impression(s) / ED Diagnoses Final diagnoses:  Pneumonia due to infectious organism, unspecified laterality, unspecified part of lung    Rx / DC Orders ED Discharge Orders     None        Roselyn Carlin NOVAK, MD 02/05/24 0003

## 2024-03-03 ENCOUNTER — Other Ambulatory Visit: Payer: Self-pay | Admitting: Physician Assistant

## 2024-03-03 ENCOUNTER — Other Ambulatory Visit: Payer: Self-pay | Admitting: Family Medicine

## 2024-03-03 DIAGNOSIS — M7989 Other specified soft tissue disorders: Secondary | ICD-10-CM

## 2024-04-17 ENCOUNTER — Other Ambulatory Visit: Payer: Self-pay | Admitting: Family Medicine

## 2024-04-17 DIAGNOSIS — F331 Major depressive disorder, recurrent, moderate: Secondary | ICD-10-CM

## 2024-10-27 ENCOUNTER — Ambulatory Visit: Admitting: Urology

## 2024-11-17 ENCOUNTER — Ambulatory Visit: Admitting: Urology
